# Patient Record
Sex: Male | Born: 1950
Health system: Southern US, Community
[De-identification: ages and names within clinical notes are randomized; demographics above are authoritative.]

## PROBLEM LIST (undated history)

## (undated) DIAGNOSIS — N400 Enlarged prostate without lower urinary tract symptoms: Secondary | ICD-10-CM

## (undated) DIAGNOSIS — N138 Other obstructive and reflux uropathy: Principal | ICD-10-CM

## (undated) DIAGNOSIS — I10 Essential (primary) hypertension: Secondary | ICD-10-CM

## (undated) DIAGNOSIS — E291 Testicular hypofunction: Secondary | ICD-10-CM

## (undated) DIAGNOSIS — T7840XA Allergy, unspecified, initial encounter: Secondary | ICD-10-CM

## (undated) DIAGNOSIS — Z87442 Personal history of urinary calculi: Secondary | ICD-10-CM

## (undated) DIAGNOSIS — E039 Hypothyroidism, unspecified: Secondary | ICD-10-CM

## (undated) DIAGNOSIS — N401 Enlarged prostate with lower urinary tract symptoms: Principal | ICD-10-CM

## (undated) DIAGNOSIS — E538 Deficiency of other specified B group vitamins: Secondary | ICD-10-CM

## (undated) DIAGNOSIS — R413 Other amnesia: Secondary | ICD-10-CM

## (undated) HISTORY — DX: Benign prostatic hyperplasia with lower urinary tract symptoms: N40.1

## (undated) HISTORY — DX: Hypothyroidism, unspecified: E03.9

## (undated) HISTORY — DX: Other amnesia: R41.3

## (undated) HISTORY — DX: Benign prostatic hyperplasia without lower urinary tract symptoms: N40.0

## (undated) HISTORY — DX: Essential (primary) hypertension: I10

## (undated) HISTORY — PX: SHOULDER SURGERY: SHX246

## (undated) HISTORY — DX: Allergy, unspecified, initial encounter: T78.40XA

## (undated) HISTORY — DX: Deficiency of other specified B group vitamins: E53.8

## (undated) HISTORY — PX: TONSILLECTOMY: SUR1361

## (undated) HISTORY — PX: EYE SURGERY: SHX253

## (undated) HISTORY — PX: KNEE SURGERY: SHX244

## (undated) HISTORY — DX: Testicular hypofunction: E29.1

## (undated) HISTORY — DX: Other obstructive and reflux uropathy: N13.8

---

## 2006-10-23 ENCOUNTER — Other Ambulatory Visit: Payer: Self-pay

## 2006-10-23 ENCOUNTER — Emergency Department: Payer: Self-pay | Admitting: Emergency Medicine

## 2011-04-08 ENCOUNTER — Ambulatory Visit: Payer: Self-pay | Admitting: Unknown Physician Specialty

## 2011-12-23 ENCOUNTER — Ambulatory Visit: Payer: Self-pay | Admitting: Orthopedic Surgery

## 2013-05-09 DIAGNOSIS — E291 Testicular hypofunction: Secondary | ICD-10-CM | POA: Diagnosis not present

## 2013-05-09 DIAGNOSIS — N410 Acute prostatitis: Secondary | ICD-10-CM | POA: Diagnosis not present

## 2013-05-09 DIAGNOSIS — I10 Essential (primary) hypertension: Secondary | ICD-10-CM | POA: Diagnosis not present

## 2013-05-09 DIAGNOSIS — Z125 Encounter for screening for malignant neoplasm of prostate: Secondary | ICD-10-CM | POA: Diagnosis not present

## 2013-05-09 DIAGNOSIS — E039 Hypothyroidism, unspecified: Secondary | ICD-10-CM | POA: Diagnosis not present

## 2013-05-09 DIAGNOSIS — K219 Gastro-esophageal reflux disease without esophagitis: Secondary | ICD-10-CM | POA: Diagnosis not present

## 2013-05-09 DIAGNOSIS — Z0000 Encounter for general adult medical examination without abnormal findings: Secondary | ICD-10-CM | POA: Diagnosis not present

## 2013-05-15 ENCOUNTER — Ambulatory Visit: Payer: Self-pay | Admitting: Family Medicine

## 2013-05-15 DIAGNOSIS — I10 Essential (primary) hypertension: Secondary | ICD-10-CM | POA: Diagnosis not present

## 2013-05-15 DIAGNOSIS — Z136 Encounter for screening for cardiovascular disorders: Secondary | ICD-10-CM | POA: Diagnosis not present

## 2013-06-11 DIAGNOSIS — R5383 Other fatigue: Secondary | ICD-10-CM | POA: Diagnosis not present

## 2013-06-11 DIAGNOSIS — R5381 Other malaise: Secondary | ICD-10-CM | POA: Diagnosis not present

## 2013-06-14 DIAGNOSIS — G4733 Obstructive sleep apnea (adult) (pediatric): Secondary | ICD-10-CM | POA: Diagnosis not present

## 2013-06-27 DIAGNOSIS — H35369 Drusen (degenerative) of macula, unspecified eye: Secondary | ICD-10-CM | POA: Diagnosis not present

## 2013-06-27 DIAGNOSIS — H43399 Other vitreous opacities, unspecified eye: Secondary | ICD-10-CM | POA: Diagnosis not present

## 2013-06-27 DIAGNOSIS — H43819 Vitreous degeneration, unspecified eye: Secondary | ICD-10-CM | POA: Diagnosis not present

## 2013-06-27 DIAGNOSIS — H35379 Puckering of macula, unspecified eye: Secondary | ICD-10-CM | POA: Diagnosis not present

## 2013-06-28 DIAGNOSIS — G4733 Obstructive sleep apnea (adult) (pediatric): Secondary | ICD-10-CM | POA: Diagnosis not present

## 2013-07-02 DIAGNOSIS — H35379 Puckering of macula, unspecified eye: Secondary | ICD-10-CM | POA: Diagnosis not present

## 2013-07-03 DIAGNOSIS — H35379 Puckering of macula, unspecified eye: Secondary | ICD-10-CM | POA: Diagnosis not present

## 2013-07-16 DIAGNOSIS — H35379 Puckering of macula, unspecified eye: Secondary | ICD-10-CM | POA: Diagnosis not present

## 2013-08-23 DIAGNOSIS — H43399 Other vitreous opacities, unspecified eye: Secondary | ICD-10-CM | POA: Diagnosis not present

## 2013-08-24 DIAGNOSIS — H43399 Other vitreous opacities, unspecified eye: Secondary | ICD-10-CM | POA: Diagnosis not present

## 2013-09-03 DIAGNOSIS — H35379 Puckering of macula, unspecified eye: Secondary | ICD-10-CM | POA: Diagnosis not present

## 2013-10-03 DIAGNOSIS — L821 Other seborrheic keratosis: Secondary | ICD-10-CM | POA: Diagnosis not present

## 2013-10-03 DIAGNOSIS — D485 Neoplasm of uncertain behavior of skin: Secondary | ICD-10-CM | POA: Diagnosis not present

## 2013-10-05 DIAGNOSIS — H35379 Puckering of macula, unspecified eye: Secondary | ICD-10-CM | POA: Diagnosis not present

## 2013-10-05 DIAGNOSIS — H02839 Dermatochalasis of unspecified eye, unspecified eyelid: Secondary | ICD-10-CM | POA: Diagnosis not present

## 2013-10-05 DIAGNOSIS — H18419 Arcus senilis, unspecified eye: Secondary | ICD-10-CM | POA: Diagnosis not present

## 2013-10-05 DIAGNOSIS — H26499 Other secondary cataract, unspecified eye: Secondary | ICD-10-CM | POA: Diagnosis not present

## 2013-10-12 DIAGNOSIS — E669 Obesity, unspecified: Secondary | ICD-10-CM | POA: Diagnosis not present

## 2013-10-12 DIAGNOSIS — G4733 Obstructive sleep apnea (adult) (pediatric): Secondary | ICD-10-CM | POA: Diagnosis not present

## 2013-10-29 DIAGNOSIS — I10 Essential (primary) hypertension: Secondary | ICD-10-CM | POA: Diagnosis not present

## 2013-10-29 DIAGNOSIS — Z23 Encounter for immunization: Secondary | ICD-10-CM | POA: Diagnosis not present

## 2014-02-15 DIAGNOSIS — G4733 Obstructive sleep apnea (adult) (pediatric): Secondary | ICD-10-CM | POA: Diagnosis not present

## 2014-02-15 DIAGNOSIS — E669 Obesity, unspecified: Secondary | ICD-10-CM | POA: Diagnosis not present

## 2014-04-02 HISTORY — PX: ESOPHAGOGASTRODUODENOSCOPY ENDOSCOPY: SHX5814

## 2014-04-02 HISTORY — PX: COLONOSCOPY: SHX5424

## 2014-04-17 ENCOUNTER — Ambulatory Visit: Payer: Self-pay | Admitting: Unknown Physician Specialty

## 2014-04-17 DIAGNOSIS — K210 Gastro-esophageal reflux disease with esophagitis: Secondary | ICD-10-CM | POA: Diagnosis not present

## 2014-04-17 DIAGNOSIS — K5730 Diverticulosis of large intestine without perforation or abscess without bleeding: Secondary | ICD-10-CM | POA: Diagnosis not present

## 2014-04-17 DIAGNOSIS — R12 Heartburn: Secondary | ICD-10-CM | POA: Diagnosis not present

## 2014-04-17 DIAGNOSIS — E079 Disorder of thyroid, unspecified: Secondary | ICD-10-CM | POA: Diagnosis not present

## 2014-04-17 DIAGNOSIS — K640 First degree hemorrhoids: Secondary | ICD-10-CM | POA: Diagnosis not present

## 2014-04-17 DIAGNOSIS — Z09 Encounter for follow-up examination after completed treatment for conditions other than malignant neoplasm: Secondary | ICD-10-CM | POA: Diagnosis not present

## 2014-04-17 DIAGNOSIS — G4730 Sleep apnea, unspecified: Secondary | ICD-10-CM | POA: Diagnosis not present

## 2014-04-17 DIAGNOSIS — Z86010 Personal history of colonic polyps: Secondary | ICD-10-CM | POA: Diagnosis not present

## 2014-04-17 DIAGNOSIS — K2960 Other gastritis without bleeding: Secondary | ICD-10-CM | POA: Diagnosis not present

## 2014-04-17 DIAGNOSIS — Z7982 Long term (current) use of aspirin: Secondary | ICD-10-CM | POA: Diagnosis not present

## 2014-04-17 DIAGNOSIS — K2950 Unspecified chronic gastritis without bleeding: Secondary | ICD-10-CM | POA: Diagnosis not present

## 2014-04-17 DIAGNOSIS — K5790 Diverticulosis of intestine, part unspecified, without perforation or abscess without bleeding: Secondary | ICD-10-CM | POA: Diagnosis not present

## 2014-04-17 DIAGNOSIS — I10 Essential (primary) hypertension: Secondary | ICD-10-CM | POA: Diagnosis not present

## 2014-04-17 DIAGNOSIS — K648 Other hemorrhoids: Secondary | ICD-10-CM | POA: Diagnosis not present

## 2014-05-14 DIAGNOSIS — N400 Enlarged prostate without lower urinary tract symptoms: Secondary | ICD-10-CM | POA: Diagnosis not present

## 2014-05-14 DIAGNOSIS — Z0000 Encounter for general adult medical examination without abnormal findings: Secondary | ICD-10-CM | POA: Diagnosis not present

## 2014-05-14 DIAGNOSIS — Z125 Encounter for screening for malignant neoplasm of prostate: Secondary | ICD-10-CM | POA: Diagnosis not present

## 2014-05-14 DIAGNOSIS — F0390 Unspecified dementia without behavioral disturbance: Secondary | ICD-10-CM | POA: Diagnosis not present

## 2014-05-14 DIAGNOSIS — I10 Essential (primary) hypertension: Secondary | ICD-10-CM | POA: Diagnosis not present

## 2014-05-14 DIAGNOSIS — E039 Hypothyroidism, unspecified: Secondary | ICD-10-CM | POA: Diagnosis not present

## 2014-05-27 LAB — SURGICAL PATHOLOGY

## 2014-06-13 DIAGNOSIS — E538 Deficiency of other specified B group vitamins: Secondary | ICD-10-CM | POA: Diagnosis not present

## 2014-06-13 DIAGNOSIS — R7309 Other abnormal glucose: Secondary | ICD-10-CM | POA: Diagnosis not present

## 2014-06-13 DIAGNOSIS — R413 Other amnesia: Secondary | ICD-10-CM

## 2014-06-13 HISTORY — DX: Other amnesia: R41.3

## 2014-06-13 HISTORY — DX: Deficiency of other specified B group vitamins: E53.8

## 2014-08-15 ENCOUNTER — Other Ambulatory Visit: Payer: Self-pay | Admitting: Neurology

## 2014-08-15 DIAGNOSIS — R413 Other amnesia: Secondary | ICD-10-CM | POA: Diagnosis not present

## 2014-08-15 DIAGNOSIS — E538 Deficiency of other specified B group vitamins: Secondary | ICD-10-CM | POA: Diagnosis not present

## 2014-08-15 DIAGNOSIS — M25512 Pain in left shoulder: Secondary | ICD-10-CM | POA: Diagnosis not present

## 2014-08-15 DIAGNOSIS — G8929 Other chronic pain: Secondary | ICD-10-CM | POA: Diagnosis not present

## 2014-08-16 DIAGNOSIS — G4733 Obstructive sleep apnea (adult) (pediatric): Secondary | ICD-10-CM | POA: Diagnosis not present

## 2014-08-16 DIAGNOSIS — E669 Obesity, unspecified: Secondary | ICD-10-CM | POA: Diagnosis not present

## 2014-08-22 ENCOUNTER — Ambulatory Visit
Admission: RE | Admit: 2014-08-22 | Discharge: 2014-08-22 | Disposition: A | Discharge status: Home / Self Care | Payer: Medicare Other | Source: Ambulatory Visit | Attending: Neurology | Admitting: Neurology

## 2014-08-22 DIAGNOSIS — I679 Cerebrovascular disease, unspecified: Secondary | ICD-10-CM | POA: Diagnosis not present

## 2014-08-22 DIAGNOSIS — R413 Other amnesia: Secondary | ICD-10-CM | POA: Diagnosis present

## 2014-09-05 DIAGNOSIS — M25512 Pain in left shoulder: Secondary | ICD-10-CM | POA: Diagnosis not present

## 2014-09-05 DIAGNOSIS — M19012 Primary osteoarthritis, left shoulder: Secondary | ICD-10-CM | POA: Diagnosis not present

## 2014-10-30 ENCOUNTER — Encounter: Payer: Self-pay | Admitting: Family Medicine

## 2014-10-30 ENCOUNTER — Ambulatory Visit (INDEPENDENT_AMBULATORY_CARE_PROVIDER_SITE_OTHER): Payer: Medicare Other | Admitting: Family Medicine

## 2014-10-30 VITALS — BP 109/68 | HR 76 | Temp 97.7°F | Ht 66.25 in | Wt 218.0 lb

## 2014-10-30 DIAGNOSIS — E039 Hypothyroidism, unspecified: Secondary | ICD-10-CM

## 2014-10-30 DIAGNOSIS — I10 Essential (primary) hypertension: Secondary | ICD-10-CM | POA: Diagnosis not present

## 2014-10-30 DIAGNOSIS — Z23 Encounter for immunization: Secondary | ICD-10-CM

## 2014-10-30 HISTORY — DX: Essential (primary) hypertension: I10

## 2014-10-30 HISTORY — DX: Hypothyroidism, unspecified: E03.9

## 2014-10-30 MED ORDER — LOSARTAN POTASSIUM-HCTZ 100-25 MG PO TABS: 1.0000 | ORAL_TABLET | Freq: Every day | ORAL | Status: DC

## 2014-10-30 NOTE — Progress Notes (Signed)
BP 109/68 mmHg  Pulse 76  Temp(Src) 97.7 F (36.5 C)  Ht 5' 6.25" (1.683 m)  Wt 218 lb (98.884 kg)  BMI 34.91 kg/m2  SpO2 98%   Subjective:    Patient ID: Joshua [name], male    DOB: 11-Dec-1950, 64 y.o.   MRN: [id number]  HPI: Joshua [name] is a 64 y.o. male  Chief Complaint  Patient presents with  . Follow-up  . Flu Vaccine   patient doing much better last visit there is concerns about memory. Patient's memory has improved as his wife who is becoming an invalid is now getting much stronger and better. Patient with decreasing stress has improved significantly. Memory concerns have resolved  Patient using CPAP every night sleeping much better and feeling better when he wakes On using CPAP may have had a big impact on memory  Blood pressure doing well on Hyzaar without issues no side effects takes medicines faithfully  Thyroid doing well no concerns takes medicines faithfully.  Relevant past medical, surgical, family and social history reviewed and updated as indicated. Interim medical history since our last visit reviewed. Allergies and medications reviewed and updated.  Review of Systems  Constitutional: Negative.   Respiratory: Negative.   Cardiovascular: Negative.     Per HPI unless specifically indicated above     Objective:    BP 109/68 mmHg  Pulse 76  Temp(Src) 97.7 F (36.5 C)  Ht 5' 6.25" (1.683 m)  Wt 218 lb (98.884 kg)  BMI 34.91 kg/m2  SpO2 98%  Wt Readings from Last 3 Encounters:  10/30/14 218 lb (98.884 kg)  08/22/14 220 lb (99.791 kg)    Physical Exam  Constitutional: He is oriented to person, place, and time. He appears well-developed and well-nourished. No distress.  HENT:  Head: Normocephalic and atraumatic.  Right Ear: Hearing normal.  Left Ear: Hearing normal.  Nose: Nose normal.  Eyes: Conjunctivae and lids are normal. Right eye exhibits no discharge. Left eye exhibits no discharge. No scleral icterus.  Cardiovascular: Normal  rate, regular rhythm and normal heart sounds.   Pulmonary/Chest: Effort normal and breath sounds normal. No respiratory distress.  Musculoskeletal: Normal range of motion.  Neurological: He is alert and oriented to person, place, and time.  Skin: Skin is intact. No rash noted.  Psychiatric: He has a normal mood and affect. His speech is normal and behavior is normal. Judgment and thought content normal. Cognition and memory are normal.    Results for orders placed or performed in visit on 04/17/14  Surgical pathology  Result Value Ref Range   SURGICAL PATHOLOGY      Surgical Procedure CASE: [id number] PATIENT: [name] Surgical Pathology Report     SPECIMEN SUBMITTED: A. Antrum, cbx B. GE junction, cbx  CLINICAL HISTORY: None provided  PRE-OPERATIVE DIAGNOSIS: Heartburn, PH polyps  POST-OPERATIVE DIAGNOSIS: Gastritis, esophagitis, diverticulosis     DIAGNOSIS: A. ANTRUM; COLD BIOPSY: - OXYNTIC TYPE MUCOSA WITH MINIMAL CHRONIC GASTRITIS. - NEGATIVE FOR H PYLORI, DYSPLASIA AND MALIGNANCY.  B. GE JUNCTION; COLD BIOPSY: - SQUAMOCOLUMNAR MUCOSA WITH REFLUX GASTROESOPHAGITIS. - NEGATIVE FOR INTESTINAL METAPLASIA, DYSPLASIA AND MALIGNANCY.  GROSS DESCRIPTION:  A. Labeled: C biopsy antrum Tissue Fragment(s): 1 Measurement: 0.3 cm Comment: Pink Entirely submitted in cassette(s): 1  B. Labeled: C biopsy gej Tissue Fragment(s): 1 Measurement: 0.3 cm Comment: Pink Entirely submitted in cassette(s): 1     Final Diagnosis performed by Delorse Lek, MD.  Electronically signed 04/19/2014 2:10:56PM    Th e electronic  signature indicates that the named Attending Pathologist has evaluated the specimen  Technical component performed at [facility], [address], [place], [place] [address] Lab: [phone] Dir: Darrick Penna. Evette Doffing, MD  Professional component performed at [facility], [facility], [place], [place],  [address] Lab:  [phone] Dir: Dellia Nims. Rubinas, MD        Assessment & Plan:   Problem List Items Addressed This Visit      Cardiovascular and Mediastinum   Benign essential HTN    Blood pressure doing well The current medical regimen is effective;  continue present plan and medications.       Relevant Medications   aspirin EC 81 MG tablet   losartan-hydrochlorothiazide (HYZAAR) 100-25 MG tablet     Endocrine   Hypothyroidism    The current medical regimen is effective;  continue present plan and medications.       Relevant Medications   levothyroxine (SYNTHROID, LEVOTHROID) 137 MCG tablet    Other Visit Diagnoses    Need for influenza vaccination    -  Primary    Relevant Orders    Flu Vaccine QUAD 36+ mos IM (Fluarix & Fluzone Quad PF (Completed)        Follow up plan: Return in about 6 months (around 04/29/2015), or if symptoms worsen or fail to improve, for Physical Exam.

## 2014-10-30 NOTE — Assessment & Plan Note (Signed)
Blood pressure doing well The current medical regimen is effective;  continue present plan and medications.

## 2014-10-30 NOTE — Assessment & Plan Note (Signed)
The current medical regimen is effective;  continue present plan and medications.  

## 2014-10-30 NOTE — Addendum Note (Signed)
Addended byGolden Pop on: 10/30/2014 02:03 PM   Modules accepted: Orders

## 2014-10-31 ENCOUNTER — Encounter: Payer: Self-pay | Admitting: Family Medicine

## 2014-10-31 LAB — BASIC METABOLIC PANEL
BUN / CREAT RATIO: 17 (ref 10–22)
BUN: 17 mg/dL (ref 8–27)
CO2: 27 mmol/L (ref 18–29)
CREATININE: 1 mg/dL (ref 0.76–1.27)
Calcium: 9.6 mg/dL (ref 8.6–10.2)
Chloride: 98 mmol/L (ref 97–108)
GFR, EST AFRICAN AMERICAN: 90 mL/min/{1.73_m2} (ref >59)
GFR, EST NON AFRICAN AMERICAN: 78 mL/min/{1.73_m2} (ref >59)
Glucose: 82 mg/dL (ref 65–99)
Potassium: 4.3 mmol/L (ref 3.5–5.2)
Sodium: 140 mmol/L (ref 134–144)

## 2015-03-20 DIAGNOSIS — M1712 Unilateral primary osteoarthritis, left knee: Secondary | ICD-10-CM | POA: Diagnosis not present

## 2015-03-20 DIAGNOSIS — M12812 Other specific arthropathies, not elsewhere classified, left shoulder: Secondary | ICD-10-CM | POA: Diagnosis not present

## 2015-03-31 ENCOUNTER — Encounter: Payer: Self-pay | Admitting: Family Medicine

## 2015-03-31 ENCOUNTER — Ambulatory Visit (INDEPENDENT_AMBULATORY_CARE_PROVIDER_SITE_OTHER): Payer: Medicare Other | Admitting: Family Medicine

## 2015-03-31 VITALS — BP 127/81 | HR 69 | Temp 97.9°F | Ht 65.3 in | Wt 214.0 lb

## 2015-03-31 DIAGNOSIS — E039 Hypothyroidism, unspecified: Secondary | ICD-10-CM | POA: Diagnosis not present

## 2015-03-31 DIAGNOSIS — I10 Essential (primary) hypertension: Secondary | ICD-10-CM

## 2015-03-31 NOTE — Progress Notes (Signed)
BP 127/81 mmHg  Pulse 69  Temp(Src) 97.9 F (36.6 C)  Ht 5' 5.3" (1.659 m)  Wt 214 lb (97.07 kg)  BMI 35.27 kg/m2  SpO2 99%   Subjective:    Patient ID: Joshua [name], male    DOB: 05-03-1950, 65 y.o.   MRN: [id number]  HPI: Joshua [name] is a 65 y.o. male  Chief Complaint  Patient presents with  . Hypothyroidism  . Fatigue  . URI    seasonal sinus issues   patient taking medications faithfully but had several days this weekend and last week where he just was really tired and draggy feeling a little better today. No illness like flu cough cold fever or chills. Patient using CPAP faithfully does have some dry mouth issues reviewed patient has face mask but may be open his mouth will try with a chinstrap Reviewed labs from last visit were normal patient not do full labs until physical in April   Relevant past medical, surgical, family and social history reviewed and updated as indicated. Interim medical history since our last visit reviewed. Allergies and medications reviewed and updated.  Review of Systems  Constitutional: Negative.   HENT: Negative.   Eyes: Negative.   Respiratory: Negative.   Cardiovascular: Negative.   Gastrointestinal: Negative.   Endocrine: Negative.   Genitourinary: Negative.   Musculoskeletal: Negative.   Skin: Negative.   Allergic/Immunologic: Negative.   Neurological: Negative.   Hematological: Negative.   Psychiatric/Behavioral: Negative.     Per HPI unless specifically indicated above     Objective:    BP 127/81 mmHg  Pulse 69  Temp(Src) 97.9 F (36.6 C)  Ht 5' 5.3" (1.659 m)  Wt 214 lb (97.07 kg)  BMI 35.27 kg/m2  SpO2 99%  Wt Readings from Last 3 Encounters:  03/31/15 214 lb (97.07 kg)  05/14/14 219 lb (99.338 kg)  10/30/14 218 lb (98.884 kg)    Physical Exam  Constitutional: He is oriented to person, place, and time. He appears well-developed and well-nourished. No distress.  HENT:  Head: Normocephalic and  atraumatic.  Right Ear: Hearing and external ear normal.  Left Ear: Hearing and external ear normal.  Nose: Nose normal.  Mouth/Throat: Oropharynx is clear and moist. No oropharyngeal exudate.  Eyes: Conjunctivae and lids are normal. Right eye exhibits no discharge. Left eye exhibits no discharge. No scleral icterus.  Neck: No tracheal deviation present. No thyromegaly present.  Cardiovascular: Normal rate, regular rhythm and normal heart sounds.   Pulmonary/Chest: Effort normal and breath sounds normal. No respiratory distress. He has no wheezes. He has no rales.  Musculoskeletal: Normal range of motion.  Lymphadenopathy:    He has no cervical adenopathy.  Neurological: He is alert and oriented to person, place, and time.  Skin: Skin is intact. No rash noted.  Psychiatric: He has a normal mood and affect. His speech is normal and behavior is normal. Judgment and thought content normal. Cognition and memory are normal.    Results for orders placed or performed in visit on 123XX123  Basic metabolic panel  Result Value Ref Range   Glucose 82 65 - 99 mg/dL   BUN 17 8 - 27 mg/dL   Creatinine, Ser 1.00 0.76 - 1.27 mg/dL   GFR calc non Af Amer 78 >59 mL/min/1.73   GFR calc Af Amer 90 >59 mL/min/1.73   BUN/Creatinine Ratio 17 10 - 22   Sodium 140 134 - 144 mmol/L   Potassium 4.3 3.5 - 5.2 mmol/L  Chloride 98 97 - 108 mmol/L   CO2 27 18 - 29 mmol/L   Calcium 9.6 8.6 - 10.2 mg/dL      Assessment & Plan:   Problem List Items Addressed This Visit      Cardiovascular and Mediastinum   Benign essential HTN - Primary    Will continue current blood pressure medications and check BMP to make sure no electrolyte issues causing fatigue      Relevant Orders   Basic metabolic panel     Endocrine   Hypothyroidism    Will check TSH to make sure adequate replacement therapy is being done      Relevant Orders   TSH       Follow up plan: Return for Physical Exam in April and not need  to do TSH.

## 2015-03-31 NOTE — Assessment & Plan Note (Signed)
Will check TSH to make sure adequate replacement therapy is being done

## 2015-03-31 NOTE — Assessment & Plan Note (Signed)
Will continue current blood pressure medications and check BMP to make sure no electrolyte issues causing fatigue

## 2015-04-01 LAB — BASIC METABOLIC PANEL
BUN / CREAT RATIO: 17 (ref 10–22)
BUN: 23 mg/dL (ref 8–27)
CO2: 25 mmol/L (ref 18–29)
CREATININE: 1.32 mg/dL — AB (ref 0.76–1.27)
Calcium: 9.4 mg/dL (ref 8.6–10.2)
Chloride: 98 mmol/L (ref 96–106)
GFR calc Af Amer: 65 mL/min/{1.73_m2} (ref >59)
GFR, EST NON AFRICAN AMERICAN: 56 mL/min/{1.73_m2} — AB (ref >59)
Glucose: 86 mg/dL (ref 65–99)
Potassium: 4.1 mmol/L (ref 3.5–5.2)
SODIUM: 138 mmol/L (ref 134–144)

## 2015-04-01 LAB — TSH: TSH: 8.71 u[IU]/mL — ABNORMAL HIGH (ref 0.450–4.500)

## 2015-04-02 ENCOUNTER — Telehealth: Payer: Self-pay | Admitting: Family Medicine

## 2015-04-02 DIAGNOSIS — E039 Hypothyroidism, unspecified: Secondary | ICD-10-CM

## 2015-04-02 NOTE — Telephone Encounter (Signed)
Phone call Discussed with patient elevated TSH patient taking thyroid medications every day without fail Patient will continue medications in a couple months will recheck TSH

## 2015-04-02 NOTE — Telephone Encounter (Signed)
-----   Message from Wynn Maudlin, [place] sent at 04/02/2015  4:57 PM EST ----- labs

## 2015-05-07 ENCOUNTER — Other Ambulatory Visit: Payer: Self-pay | Admitting: Family Medicine

## 2015-05-08 NOTE — Telephone Encounter (Signed)
He should have enough to make it to his recheck on 5/1- can you make sure he does? Thanks.

## 2015-05-09 NOTE — Telephone Encounter (Signed)
Patient notified, he does have enough to get through to his appointment.

## 2015-05-15 ENCOUNTER — Encounter: Payer: Self-pay | Admitting: Family Medicine

## 2015-05-15 ENCOUNTER — Ambulatory Visit (INDEPENDENT_AMBULATORY_CARE_PROVIDER_SITE_OTHER): Payer: Medicare Other | Admitting: Family Medicine

## 2015-05-15 VITALS — BP 118/79 | HR 96 | Temp 98.2°F | Ht 65.1 in | Wt 211.0 lb

## 2015-05-15 DIAGNOSIS — E039 Hypothyroidism, unspecified: Secondary | ICD-10-CM | POA: Diagnosis not present

## 2015-05-15 DIAGNOSIS — I10 Essential (primary) hypertension: Secondary | ICD-10-CM | POA: Diagnosis not present

## 2015-05-15 DIAGNOSIS — N138 Other obstructive and reflux uropathy: Secondary | ICD-10-CM

## 2015-05-15 DIAGNOSIS — Z0000 Encounter for general adult medical examination without abnormal findings: Secondary | ICD-10-CM

## 2015-05-15 DIAGNOSIS — J019 Acute sinusitis, unspecified: Secondary | ICD-10-CM | POA: Diagnosis not present

## 2015-05-15 DIAGNOSIS — N401 Enlarged prostate with lower urinary tract symptoms: Secondary | ICD-10-CM

## 2015-05-15 HISTORY — DX: Benign prostatic hyperplasia with lower urinary tract symptoms: N13.8

## 2015-05-15 HISTORY — DX: Benign prostatic hyperplasia with lower urinary tract symptoms: N40.1

## 2015-05-15 LAB — URINALYSIS, ROUTINE W REFLEX MICROSCOPIC
Bilirubin, UA: NEGATIVE
GLUCOSE, UA: NEGATIVE
Ketones, UA: NEGATIVE
LEUKOCYTES UA: NEGATIVE
Nitrite, UA: NEGATIVE
Protein, UA: NEGATIVE
RBC, UA: NEGATIVE
Specific Gravity, UA: 1.015 (ref 1.005–1.030)
UUROB: 0.2 mg/dL (ref 0.2–1.0)
pH, UA: 5.5 (ref 5.0–7.5)

## 2015-05-15 MED ORDER — TAMSULOSIN HCL 0.4 MG PO CAPS: 0.4000 mg | ORAL_CAPSULE | Freq: Every day | ORAL | Status: DC

## 2015-05-15 MED ORDER — LOSARTAN POTASSIUM-HCTZ 100-25 MG PO TABS: 1.0000 | ORAL_TABLET | Freq: Every day | ORAL | Status: DC

## 2015-05-15 MED ORDER — AMOXICILLIN 875 MG PO TABS: 875.0000 mg | ORAL_TABLET | Freq: Two times a day (BID) | ORAL | Status: DC

## 2015-05-15 NOTE — Assessment & Plan Note (Signed)
Discuss Will add Flomax

## 2015-05-15 NOTE — Progress Notes (Signed)
BP 118/79 mmHg  Pulse 96  Temp(Src) 98.2 F (36.8 C)  Ht 5' 5.1" (1.654 m)  Wt 211 lb (95.709 kg)  BMI 34.98 kg/m2  SpO2 96%   Subjective:    Patient ID: Joshua [name], male    DOB: 1950-10-13, 65 y.o.   MRN: [id number]  HPI: Joshua [name] is a 65 y.o. male  Chief Complaint  Patient presents with  . Annual Exam   Patient follow-up for physical has been having problems with fatigue pretty much all year concerned that last TSH was elevated concerned may still have some thyroid issues which will be rechecked today. Patient taking his thyroid medications faithfully with his other medicines class of water and wait 30 minutes until he eats.'s been doing this consistently for for years. Taking B12 pills Patient also for about a weeks been having head cold congestion drainage sore throat coughing but symptoms mostly throat no real fever but is really felt bad with marked systemic symptoms. Doing well with blood pressure medications no concerns or complaints Patient also with having to use the bathroom a lot concerned hydrochlorothiazide may be aggravating doesn't drink a lot of fluids but has to go to the bathroom a lot and it takes a long time.   Relevant past medical, surgical, family and social history reviewed and updated as indicated. Interim medical history since our last visit reviewed. Allergies and medications reviewed and updated.  Other than above  Review of Systems  Constitutional: Negative.   HENT: Negative.   Eyes: Negative.   Respiratory: Negative.   Cardiovascular: Negative.   Gastrointestinal: Negative.   Endocrine: Negative.   Genitourinary: Negative.   Musculoskeletal:       Patient's left knee bothers having had a shot in it but still hurts and is bad. Hasn't been back to orthopedics yet  Skin: Negative.   Allergic/Immunologic: Negative.   Neurological: Negative.   Hematological: Negative.   Psychiatric/Behavioral: Negative.     Per HPI unless  specifically indicated above     Objective:    BP 118/79 mmHg  Pulse 96  Temp(Src) 98.2 F (36.8 C)  Ht 5' 5.1" (1.654 m)  Wt 211 lb (95.709 kg)  BMI 34.98 kg/m2  SpO2 96%  Wt Readings from Last 3 Encounters:  05/15/15 211 lb (95.709 kg)  03/31/15 214 lb (97.07 kg)  05/14/14 219 lb (99.338 kg)    Physical Exam  Constitutional: He is oriented to person, place, and time. He appears well-developed and well-nourished.  HENT:  Head: Normocephalic and atraumatic.  Right Ear: External ear normal.  Left Ear: External ear normal.  Eyes: Conjunctivae and EOM are normal. Pupils are equal, round, and reactive to light.  Neck: Normal range of motion. Neck supple.  Cardiovascular: Normal rate, regular rhythm, normal heart sounds and intact distal pulses.   Pulmonary/Chest: Effort normal and breath sounds normal.  Abdominal: Soft. Bowel sounds are normal. There is no splenomegaly or hepatomegaly.  Genitourinary: Rectum normal and penis normal.  BPH  Musculoskeletal: Normal range of motion.  Neurological: He is alert and oriented to person, place, and time. He has normal reflexes.  Skin: No rash noted. No erythema.  Psychiatric: He has a normal mood and affect. His behavior is normal. Judgment and thought content normal.    Results for orders placed or performed in visit on 03/31/15  TSH  Result Value Ref Range   TSH 8.710 (H) 0.450 - 4.500 uIU/mL  Basic metabolic panel  Result Value Ref  Range   Glucose 86 65 - 99 mg/dL   BUN 23 8 - 27 mg/dL   Creatinine, Ser 1.32 (H) 0.76 - 1.27 mg/dL   GFR calc non Af Amer 56 (L) >59 mL/min/1.73   GFR calc Af Amer 65 >59 mL/min/1.73   BUN/Creatinine Ratio 17 10 - 22   Sodium 138 134 - 144 mmol/L   Potassium 4.1 3.5 - 5.2 mmol/L   Chloride 98 96 - 106 mmol/L   CO2 25 18 - 29 mmol/L   Calcium 9.4 8.6 - 10.2 mg/dL      Assessment & Plan:   Problem List Items Addressed This Visit      Cardiovascular and Mediastinum   Benign essential HTN -  Primary    The current medical regimen is effective;  continue present plan and medications.       Relevant Medications   losartan-hydrochlorothiazide (HYZAAR) 100-25 MG tablet     Endocrine   Hypothyroidism    The current medical regimen is effective;  continue present plan and medications.         Genitourinary   BPH with obstruction/lower urinary tract symptoms    Discuss Will add Flomax      Relevant Medications   tamsulosin (FLOMAX) 0.4 MG CAPS capsule    Other Visit Diagnoses    Acute sinusitis, recurrence not specified, unspecified location        Scuffs sinusitis care and treatment use of Amoxil use of Tussionex and this patient has which helps Tylenol cold and sinus etc. Mucinex D and so on.    Relevant Medications    amoxicillin (AMOXIL) 875 MG tablet    PE (physical exam), annual            Follow up plan: Return in about 6 months (around 11/14/2015) for BMP.

## 2015-05-15 NOTE — Assessment & Plan Note (Signed)
The current medical regimen is effective;  continue present plan and medications.  

## 2015-05-16 LAB — COMPREHENSIVE METABOLIC PANEL
ALK PHOS: 60 IU/L (ref 39–117)
ALT: 11 IU/L (ref 0–44)
AST: 16 IU/L (ref 0–40)
Albumin/Globulin Ratio: 1.7 (ref 1.2–2.2)
Albumin: 4.3 g/dL (ref 3.6–4.8)
BILIRUBIN TOTAL: 0.6 mg/dL (ref 0.0–1.2)
BUN / CREAT RATIO: 18 (ref 10–24)
BUN: 22 mg/dL (ref 8–27)
CHLORIDE: 97 mmol/L (ref 96–106)
CO2: 24 mmol/L (ref 18–29)
CREATININE: 1.23 mg/dL (ref 0.76–1.27)
Calcium: 9.4 mg/dL (ref 8.6–10.2)
GFR calc Af Amer: 70 mL/min/{1.73_m2} (ref >59)
GFR calc non Af Amer: 60 mL/min/{1.73_m2} (ref >59)
GLOBULIN, TOTAL: 2.6 g/dL (ref 1.5–4.5)
GLUCOSE: 96 mg/dL (ref 65–99)
Potassium: 3.9 mmol/L (ref 3.5–5.2)
SODIUM: 137 mmol/L (ref 134–144)
Total Protein: 6.9 g/dL (ref 6.0–8.5)

## 2015-05-16 LAB — CBC WITH DIFFERENTIAL/PLATELET
BASOS: 0 %
Basophils Absolute: 0 10*3/uL (ref 0.0–0.2)
EOS (ABSOLUTE): 0.3 10*3/uL (ref 0.0–0.4)
EOS: 4 %
HEMATOCRIT: 37.6 % (ref 37.5–51.0)
HEMOGLOBIN: 12.7 g/dL (ref 12.6–17.7)
IMMATURE GRANS (ABS): 0 10*3/uL (ref 0.0–0.1)
IMMATURE GRANULOCYTES: 0 %
LYMPHS: 26 %
Lymphocytes Absolute: 2.2 10*3/uL (ref 0.7–3.1)
MCH: 32.6 pg (ref 26.6–33.0)
MCHC: 33.8 g/dL (ref 31.5–35.7)
MCV: 97 fL (ref 79–97)
Monocytes Absolute: 0.8 10*3/uL (ref 0.1–0.9)
Monocytes: 10 %
NEUTROS ABS: 5.1 10*3/uL (ref 1.4–7.0)
NEUTROS PCT: 60 %
Platelets: 196 10*3/uL (ref 150–379)
RBC: 3.89 x10E6/uL — ABNORMAL LOW (ref 4.14–5.80)
RDW: 14.2 % (ref 12.3–15.4)
WBC: 8.4 10*3/uL (ref 3.4–10.8)

## 2015-05-16 LAB — PSA: Prostate Specific Ag, Serum: 3.1 ng/mL (ref 0.0–4.0)

## 2015-05-16 LAB — LIPID PANEL
CHOLESTEROL TOTAL: 192 mg/dL (ref 100–199)
Chol/HDL Ratio: 4.8 ratio units (ref 0.0–5.0)
HDL: 40 mg/dL (ref >39)
LDL CALC: 99 mg/dL (ref 0–99)
TRIGLYCERIDES: 267 mg/dL — AB (ref 0–149)
VLDL Cholesterol Cal: 53 mg/dL — ABNORMAL HIGH (ref 5–40)

## 2015-05-16 LAB — TSH: TSH: 1.18 u[IU]/mL (ref 0.450–4.500)

## 2015-05-19 ENCOUNTER — Encounter: Payer: Self-pay | Admitting: Family Medicine

## 2015-05-22 ENCOUNTER — Telehealth: Payer: Self-pay | Admitting: Family Medicine

## 2015-05-22 MED ORDER — LEVOTHYROXINE SODIUM 137 MCG PO TABS: 137.0000 ug | ORAL_TABLET | Freq: Every day | ORAL | Status: DC

## 2015-05-22 NOTE — Telephone Encounter (Signed)
Pt called has questions about his thyroid. Stated the letter did not mention anything about his thyroid results. Please call pt back ASAP. Thanks.

## 2015-05-22 NOTE — Telephone Encounter (Signed)
Call pt 

## 2015-05-22 NOTE — Telephone Encounter (Signed)
TSH was normal, patient would like 90 day Rx sent to [facility] for his Levothyroxine

## 2015-05-22 NOTE — Telephone Encounter (Signed)
Called patient, no answer. Left msg to please call back

## 2015-05-22 NOTE — Telephone Encounter (Signed)
Pt returned call

## 2015-05-26 ENCOUNTER — Telehealth: Payer: Self-pay | Admitting: Family Medicine

## 2015-05-26 DIAGNOSIS — N138 Other obstructive and reflux uropathy: Secondary | ICD-10-CM

## 2015-05-26 DIAGNOSIS — N401 Enlarged prostate with lower urinary tract symptoms: Principal | ICD-10-CM

## 2015-05-26 NOTE — Telephone Encounter (Signed)
Phone call Discussed with patient has developed hives while taking Flomax stopped that has taken a couple Benadryl hives are little better is breathing okay. Will refer patient to urology to further evaluate BPH with lower urinary tract symptoms and best way to treat.

## 2015-05-26 NOTE — Telephone Encounter (Signed)
Pt called stated his Flomax is causing him to break out in hives. Pt would like a call back from Dr. Jeananne Rama. Pt stated he has taken 2 Benadryl. Please call pt ASAP. Thanks.

## 2015-06-10 ENCOUNTER — Ambulatory Visit (INDEPENDENT_AMBULATORY_CARE_PROVIDER_SITE_OTHER): Payer: Medicare Other | Admitting: Urology

## 2015-06-10 ENCOUNTER — Encounter: Payer: Self-pay | Admitting: Urology

## 2015-06-10 VITALS — BP 99/66 | HR 81 | Ht 66.5 in | Wt 213.7 lb

## 2015-06-10 DIAGNOSIS — N5201 Erectile dysfunction due to arterial insufficiency: Secondary | ICD-10-CM | POA: Diagnosis not present

## 2015-06-10 DIAGNOSIS — N138 Other obstructive and reflux uropathy: Secondary | ICD-10-CM

## 2015-06-10 DIAGNOSIS — N529 Male erectile dysfunction, unspecified: Secondary | ICD-10-CM | POA: Insufficient documentation

## 2015-06-10 DIAGNOSIS — N200 Calculus of kidney: Secondary | ICD-10-CM

## 2015-06-10 DIAGNOSIS — Z125 Encounter for screening for malignant neoplasm of prostate: Secondary | ICD-10-CM | POA: Diagnosis not present

## 2015-06-10 DIAGNOSIS — N401 Enlarged prostate with lower urinary tract symptoms: Secondary | ICD-10-CM | POA: Diagnosis not present

## 2015-06-10 LAB — URINALYSIS, COMPLETE
BILIRUBIN UA: NEGATIVE
GLUCOSE, UA: NEGATIVE
KETONES UA: NEGATIVE
LEUKOCYTES UA: NEGATIVE
NITRITE UA: NEGATIVE
Protein, UA: NEGATIVE
RBC UA: NEGATIVE
SPEC GRAV UA: 1.01 (ref 1.005–1.030)
Urobilinogen, Ur: 0.2 mg/dL (ref 0.2–1.0)
pH, UA: 6 (ref 5.0–7.5)

## 2015-06-10 LAB — BLADDER SCAN AMB NON-IMAGING: Scan Result: 84

## 2015-06-10 LAB — MICROSCOPIC EXAMINATION
Bacteria, UA: NONE SEEN
EPITHELIAL CELLS (NON RENAL): NONE SEEN /HPF (ref 0–10)
RBC MICROSCOPIC, UA: NONE SEEN /HPF (ref 0–?)
WBC UA: NONE SEEN /HPF (ref 0–?)

## 2015-06-10 MED ORDER — SILODOSIN 8 MG PO CAPS: 8.0000 mg | ORAL_CAPSULE | Freq: Every day | ORAL | Status: DC

## 2015-06-10 NOTE — Progress Notes (Signed)
06/10/2015 2:21 PM   Joshua [name] 08/03/1950 [id number]  Referring provider: Guadalupe Maple, MD [address] [place], [place] [address]  Chief Complaint  Patient presents with  . New Patient (Initial Visit)    BPH    HPI:  1 - Lower Urinary Tract Symptoms - Pt with slowly progressive bother from mostly obstructive symptoms with weak stream, straining, hesitancy, and prolonged post-void dribble, No irritative symptoms. PVR 2017 "95mL" (not severely elevated). DRE 2017 35gm (not significantly enlarged) but cysto does confirm intraluminal hypertrophy.  He gives history of what sound like stricture dilation years ago but cysto 06/2015 confirms no lower tract significant sricture disesae.  Given trial of tamsulosin by PCP but developed hives. Giving trial of Rapaflo 06/2015.   2 - Prostate Screening - No FHX prostate cancer 06/2015 PSA 3.1 / DRE 35gm smoth  3 - Erectile Dysfunction - pt with moderate bother from lack of firmness during intercourse. Unaided is able to penetrate 100% and maintian to orgasm 100% of time, just "softer" than when younger. No nitrates. After consideration of pharmacotherapy he desires observation.  PMH sig for jaw surgery, obesity. No CV disease / blood thinners. 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  Today "raidon" is seen as new patient for above. They are referred by [name] MD.    PMH: Past Medical History  Diagnosis Date  . Allergy   . BPH (benign prostatic hyperplasia)   . Hypogonadism in male   . Benign essential HTN 10/30/2014  . B12 deficiency 06/13/2014  . Hypothyroidism 10/30/2014  . BPH with obstruction/lower urinary tract symptoms 05/15/2015  . Amnesia 06/13/2014    Surgical History: Past Surgical History  Procedure Laterality Date  . Knee surgery Right   . Shoulder surgery Left   . Tonsillectomy    . Eye surgery Bilateral     cataracts  . Colonoscopy  March 2016  . Esophagogastroduodenoscopy endoscopy  March  2016    Home Medications:    Medication List       This list is accurate as of: 06/10/15  2:21 PM.  Always use your most recent med list.               amoxicillin 875 MG tablet  Commonly known as:  AMOXIL  Take 1 tablet (875 mg total) by mouth 2 (two) times daily.     aspirin EC 81 MG tablet  Take 81 mg by mouth daily.     levothyroxine 137 MCG tablet  Commonly known as:  SYNTHROID, LEVOTHROID  Take 1 tablet (137 mcg total) by mouth daily before breakfast.     losartan-hydrochlorothiazide 100-25 MG tablet  Commonly known as:  HYZAAR  Take 1 tablet by mouth daily.     RA VITAMIN B-12 TR 1000 MCG Tbcr  Generic drug:  Cyanocobalamin  Take 1,000 mcg by mouth daily.        Allergies:  Allergies  Allergen Reactions  . Aspirin Hives  . Celery Oil Swelling  . Flomax [Tamsulosin Hcl] Hives  . Hydromorphone Hives  . Latex Rash    Family History: Family History  Problem Relation Age of Onset  . Cancer Mother     breast  . COPD Father   . Heart disease Father     Social History:  reports that he has never smoked. He has never used smokeless tobacco. He reports that he drinks alcohol. He reports that he does not use illicit drugs.  ROS: UROLOGY Frequent Urination?: Yes Hard to postpone urination?: Yes Burning/pain with urination?: No Get up at night to urinate?: Yes Leakage of urine?: Yes Urine stream starts and stops?: No Trouble starting stream?: No Do you have to strain to urinate?: No Blood in urine?: No Urinary tract infection?: No Sexually transmitted disease?: No Injury to kidneys or bladder?: No Painful intercourse?: No Weak stream?: Yes Erection problems?: Yes Penile pain?: No  Gastrointestinal Nausea?: No Vomiting?: No Indigestion/heartburn?: No Diarrhea?: Yes Constipation?: No  Constitutional Fever: No Night sweats?: No Weight loss?: Yes Fatigue?: Yes  Skin Skin rash/lesions?: Yes Itching?: Yes  Eyes Blurred vision?:  No Double vision?: No  Ears/Nose/Throat Sore throat?: No Sinus problems?: Yes  Hematologic/Lymphatic Swollen glands?: No Easy bruising?: No  Cardiovascular Leg swelling?: No Chest pain?: No  Respiratory Cough?: No Shortness of breath?: No  Endocrine Excessive thirst?: Yes  Musculoskeletal Back pain?: No Joint pain?: No  Neurological Headaches?: No Dizziness?: No  Psychologic Depression?: No Anxiety?: No  Physical Exam: Ht 5' 6.5" (1.689 m)  Wt 213 lb 11.2 oz (96.934 kg)  BMI 33.98 kg/m2  Constitutional:  Alert and oriented, No acute distress. HEENT: [place] AT, moist mucus membranes.  Trachea midline, no masses. Cardiovascular: No clubbing, cyanosis, or edema. Respiratory: Normal respiratory effort, no increased work of breathing. GI: Abdomen is soft, nontender, nondistended, no abdominal masses GU: No CVA tenderness. DRE 35gm smooth. No testes masses. No hernias.  Skin: No rashes, bruises or suspicious lesions. Lymph: No cervical or inguinal adenopathy. Neurologic: Grossly intact, no focal deficits, moving all 4 extremities. Psychiatric: Normal mood and affect.  Laboratory Data: Lab Results  Component Value Date   WBC 8.4 05/15/2015   HCT 37.6 05/15/2015   MCV 97 05/15/2015   PLT 196 05/15/2015    Lab Results  Component Value Date   CREATININE 1.23 05/15/2015    No results found for: PSA  No results found for: TESTOSTERONE  No results found for: HGBA1C  Urinalysis    Component Value Date/Time   APPEARANCEUR Clear 05/15/2015 1502   GLUCOSEU Negative 05/15/2015 1502   BILIRUBINUR Negative 05/15/2015 1502   PROTEINUR Negative 05/15/2015 1502   NITRITE Negative 05/15/2015 1502   LEUKOCYTESUR Negative 05/15/2015 1502      Cystoscopy Procedure Note  Patient identification was confirmed, informed consent was obtained, and patient was prepped using Betadine solution.  Lidocaine jelly was administered per urethral meatus.    Preoperative abx  where received prior to procedure.     Pre-Procedure: - Inspection reveals a normal caliber ureteral meatus.  Procedure: The flexible cystoscope was introduced without difficulty - No urethral strictures/lesions are present. - Enlarged prostate intraluminally with "kissing lobe" protrusion. - Normal bladder neck - Bilateral ureteral orifices identified - Bladder mucosa  reveals no ulcers, tumors, or lesions - No bladder stones - No trabeculation  Retroflexion shows no additional findings.    Post-Procedure: - Patient tolerated the procedure well     Assessment & Plan:    1 - Lower Urinary Tract Symptoms - Likely from intraluminal BPH changes, no sricture recurrence by cysto. As prostate is not very large palpably, alpha blocker most likely to achive symptom response. Suggest trial of Rapaflo. Frankly discussed there is some risk of crossreactivity with tamsulosin. If this does not meet his goals or he has hives again, would strongly consider TURP / PVP / or even urolift.   2 - Prostate Screening - up to date this year, rec continue screening until age 71-75 as  average risk.    3 - Erectile Dysfunction -discussed natural history of age-related vasculogenic decline and that he is actually doing much better than majority of men at baseline. He was very reassured to hear this. Discussed role of pharmacotherapy and he declines, may reconsider if progresses.   4 - RTC 67mos to verify response to therapy above, then anually v prn.   No Follow-up on file.  Alexis Frock, [facility] [address] [place], [place] [address] [phone]

## 2015-06-18 ENCOUNTER — Telehealth: Payer: Self-pay

## 2015-07-14 ENCOUNTER — Telehealth: Payer: Self-pay | Admitting: Urology

## 2015-07-14 ENCOUNTER — Other Ambulatory Visit: Payer: Self-pay

## 2015-07-14 DIAGNOSIS — N401 Enlarged prostate with lower urinary tract symptoms: Principal | ICD-10-CM

## 2015-07-14 DIAGNOSIS — N138 Other obstructive and reflux uropathy: Secondary | ICD-10-CM

## 2015-07-14 DIAGNOSIS — N400 Enlarged prostate without lower urinary tract symptoms: Secondary | ICD-10-CM

## 2015-07-14 MED ORDER — SILODOSIN 8 MG PO CAPS: 8.0000 mg | ORAL_CAPSULE | Freq: Every day | ORAL | Status: DC

## 2015-07-14 NOTE — Telephone Encounter (Signed)
Pt came by the office stating prescription was never sent to [facility] on [address].  It was sent in May 2017.  Kelita printed out a copy of where it was sent and we gave it to pt.  Now he says Vladimir Faster should have called our office to see if there's something else we could prescribe that was cheaper.  Vladimir Faster was supposed to contact our office, but there are no notes in pt chart.  Can you please check with Dr. Tresa Moore to see if there is anything cheaper than Rapaflo to prescribe to pt?

## 2015-07-15 NOTE — Telephone Encounter (Signed)
Please advise 

## 2015-07-15 NOTE — Telephone Encounter (Signed)
Dr. Zettie Pho note stated that he experienced hives while on the tamsulosin.  If his insurance will not cover Rapaflo, it most likely will not cover Cialis.  We can call this medication in and see what his insurance does.

## 2015-07-16 MED ORDER — TADALAFIL 5 MG PO TABS
5.0000 mg | ORAL_TABLET | Freq: Every day | ORAL | Status: DC | PRN
Start: 2015-07-16 — End: 2015-07-16

## 2015-07-16 MED ORDER — TADALAFIL 5 MG PO TABS: 5.0000 mg | ORAL_TABLET | Freq: Every day | ORAL | Status: DC | PRN

## 2015-07-16 NOTE — Telephone Encounter (Signed)
cialis sent into pharmacy.

## 2015-07-21 DIAGNOSIS — G8929 Other chronic pain: Secondary | ICD-10-CM | POA: Diagnosis not present

## 2015-07-21 DIAGNOSIS — M1712 Unilateral primary osteoarthritis, left knee: Secondary | ICD-10-CM | POA: Diagnosis not present

## 2015-07-21 DIAGNOSIS — M25562 Pain in left knee: Secondary | ICD-10-CM | POA: Diagnosis not present

## 2015-07-22 ENCOUNTER — Telehealth: Payer: Self-pay | Admitting: Urology

## 2015-07-22 NOTE — Telephone Encounter (Signed)
Per Dr. Louis Meckel pt should start Alfuzosin 10mg  daily. Gave verbal order to pharmacist. Pt will call back in a month to make aware if medication works or not.

## 2015-07-22 NOTE — Telephone Encounter (Signed)
Mr. Wallace stopped by the office on 6.20.17 saying his insurance carrier wouldn't pay for Cialis to be filled and was wondering if a Rx of Rapaflo could be sent to his pharmacy instead. I told him it was already sent and confirmed with a representative from [facility] that they had the Rx. The young lady said the Rx would be ready by 4:30pm today. Yasmine from [facility] called at 4:34pm saying the Rx needs a prior authorization.  Yasmine's ph# [phone] Thank you.

## 2015-07-24 ENCOUNTER — Telehealth: Payer: Self-pay | Admitting: Urology

## 2015-07-24 NOTE — Telephone Encounter (Signed)
There are multiple medications listed for his BPH.  Would you contact the patient and ask what medication he is taking, alfuzosin, tamsulosin, Cialis or Rapaflo?

## 2015-07-24 NOTE — Telephone Encounter (Signed)
Okay.  There is a request for refill for Cialis in my co-sign list.

## 2015-07-24 NOTE — Telephone Encounter (Signed)
Spoke with pt in reference to BPH medication. Pt stated that he is currently taking Alfuzosin. Pt stated he had a reaction to tamsulosin and insurance would not pay for cialis or rapaflo.

## 2015-09-16 ENCOUNTER — Encounter: Payer: Self-pay | Admitting: Urology

## 2015-09-16 ENCOUNTER — Ambulatory Visit (INDEPENDENT_AMBULATORY_CARE_PROVIDER_SITE_OTHER): Payer: Medicare Other | Admitting: Urology

## 2015-09-16 VITALS — BP 127/76 | HR 63 | Ht 66.0 in | Wt 212.0 lb

## 2015-09-16 DIAGNOSIS — N401 Enlarged prostate with lower urinary tract symptoms: Secondary | ICD-10-CM

## 2015-09-16 DIAGNOSIS — N200 Calculus of kidney: Secondary | ICD-10-CM

## 2015-09-16 DIAGNOSIS — N5201 Erectile dysfunction due to arterial insufficiency: Secondary | ICD-10-CM | POA: Diagnosis not present

## 2015-09-16 DIAGNOSIS — N138 Other obstructive and reflux uropathy: Secondary | ICD-10-CM

## 2015-09-16 NOTE — Progress Notes (Signed)
09/16/2015 8:10 AM   Joshua [name] Apr 24, 1950 [id number]  Referring provider: Guadalupe Maple, MD [address] [place], [place] [address]  No chief complaint on file.   HPI:  1 - Lower Urinary Tract Symptoms - Pt with slowly progressive bother from mostly obstructive symptoms with weak stream, straining, hesitancy, and prolonged post-void dribble, No irritative symptoms. PVR 2017 "42mL" (not severely elevated). DRE 2017 35gm (not significantly enlarged) but cysto does confirm intraluminal hypertrophy.  He gives history of what sound like stricture dilation years ago but cysto 06/2015 confirms no lower tract significant sricture disesae.  Given trial of tamsulosin by PCP but developed hives. Insurance denied rapaflo and daily cialis. Now on alfuzosin 10 QD which he states makes symptoms WORSE and therefore stopped. At this point his level of bother is not so much he wants therapy.   2 - Prostate Screening - No FHX prostate cancer 06/2015 PSA 3.1 / DRE 35gm smoth  3 - Erectile Dysfunction - pt with moderate bother from lack of firmness during intercourse. Unaided is able to penetrate 100% and maintian to orgasm 100% of time, just "softer" than when younger. No nitrates. After consideration of pharmacotherapy he desires observation.  PMH sig for jaw surgery, obesity. No CV disease / blood thinners. 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  Today "romain" is seen in f/u above. He has now tried several alpha blockers and not been significantly improved. He thinks his baseline is better.    PMH: Past Medical History:  Diagnosis Date  . Allergy   . Amnesia 06/13/2014  . B12 deficiency 06/13/2014  . Benign essential HTN 10/30/2014  . BPH (benign prostatic hyperplasia)   . BPH with obstruction/lower urinary tract symptoms 05/15/2015  . Hypogonadism in male   . Hypothyroidism 10/30/2014    Surgical History: Past Surgical History:  Procedure Laterality Date  . COLONOSCOPY   March 2016  . ESOPHAGOGASTRODUODENOSCOPY ENDOSCOPY  March 2016  . EYE SURGERY Bilateral    cataracts  . KNEE SURGERY Right   . SHOULDER SURGERY Left   . TONSILLECTOMY      Home Medications:    Medication List       Accurate as of 09/16/15  8:10 AM. Always use your most recent med list.          amoxicillin 875 MG tablet Commonly known as:  AMOXIL Take 1 tablet (875 mg total) by mouth 2 (two) times daily.   aspirin EC 81 MG tablet Take 81 mg by mouth daily.   levothyroxine 137 MCG tablet Commonly known as:  SYNTHROID, LEVOTHROID Take 1 tablet (137 mcg total) by mouth daily before breakfast.   losartan-hydrochlorothiazide 100-25 MG tablet Commonly known as:  HYZAAR Take 1 tablet by mouth daily.   RA VITAMIN B-12 TR 1000 MCG Tbcr Generic drug:  Cyanocobalamin Take 1,000 mcg by mouth daily.   silodosin 8 MG Caps capsule Commonly known as:  RAPAFLO Take 1 capsule (8 mg total) by mouth daily with breakfast.   tadalafil 5 MG tablet Commonly known as:  CIALIS Take 1 tablet (5 mg total) by mouth daily as needed for erectile dysfunction.       Allergies:  Allergies  Allergen Reactions  . Aspirin Hives  . Celery Oil Swelling  . Flomax [Tamsulosin Hcl] Hives  . Hydromorphone Hives  . Latex Rash    Family History: Family History  Problem Relation Age of Onset  . Cancer Mother  breast  . COPD Father   . Heart disease Father   . Prostate cancer Father     Social History:  reports that he has never smoked. He has never used smokeless tobacco. He reports that he drinks alcohol. He reports that he does not use drugs.   Review of Systems  Gastrointestinal (upper)  : Negative for upper GI symptoms  Gastrointestinal (lower) : Negative for lower GI symptoms  Constitutional : Negative for symptoms  Skin: Negative for skin symptoms  Eyes: Negative for eye symptoms  Ear/Nose/Throat : Negative for Ear/Nose/Throat  symptoms  Hematologic/Lymphatic: Negative for Hematologic/Lymphatic symptoms  Cardiovascular : Negative for cardiovascular symptoms  Respiratory : Negative for respiratory symptoms  Endocrine: Negative for endocrine symptoms  Musculoskeletal: Joint pain  Neurological: Negative for neurological symptoms  Psychologic: Negative for psychiatric symptoms     Physical Exam: There were no vitals taken for this visit.  Constitutional:  Alert and oriented, No acute distress. HEENT: [place] AT, moist mucus membranes.  Trachea midline, no masses. Cardiovascular: No clubbing, cyanosis, or edema. Respiratory: Normal respiratory effort, no increased work of breathing. GI: Abdomen is soft, nontender, nondistended, no abdominal masses GU: No CVA tenderness. DRE 35gm smooth. No testes masses. No hernias.  Skin: No rashes, bruises or suspicious lesions. Lymph: No cervical or inguinal adenopathy. Neurologic: Grossly intact, no focal deficits, moving all 4 extremities. Psychiatric: Normal mood and affect.  Laboratory Data: Lab Results  Component Value Date   WBC 8.4 05/15/2015   HCT 37.6 05/15/2015   MCV 97 05/15/2015   PLT 196 05/15/2015    Lab Results  Component Value Date   CREATININE 1.23 05/15/2015    No results found for: PSA  No results found for: TESTOSTERONE  No results found for: HGBA1C  Urinalysis    Component Value Date/Time   APPEARANCEUR Clear 06/10/2015 1431   GLUCOSEU Negative 06/10/2015 1431   BILIRUBINUR Negative 06/10/2015 1431   PROTEINUR Negative 06/10/2015 1431   NITRITE Negative 06/10/2015 1431   LEUKOCYTESUR Negative 06/10/2015 1431       Assessment & Plan:    1 - Lower Urinary Tract Symptoms - Likely from intraluminal BPH changes, no sricture recurrence by cysto this year. He did not derive benefit from alpha blockers. Discussed observation v. Trial of finasteride v. Surgical therapy. He opts for observation, consider finasteride should bother  increase. I agree completely as he empties well.   2 - Prostate Screening - up to date this year, rec continue screening until age 29-75 as average risk.    3 - Erectile Dysfunction -discussed natural history of age-related vasculogenic decline and that he is actually doing much better than majority of men at baseline. He was very reassured to hear this. Discussed role of pharmacotherapy and he declines, may reconsider if progresses.   4 - RTC prn  No Follow-up on file.  Alexis Frock, [facility] [address], [place],  [address] [phone]

## 2015-09-25 DIAGNOSIS — G4733 Obstructive sleep apnea (adult) (pediatric): Secondary | ICD-10-CM | POA: Diagnosis not present

## 2015-09-25 DIAGNOSIS — E669 Obesity, unspecified: Secondary | ICD-10-CM | POA: Diagnosis not present

## 2015-10-17 DIAGNOSIS — H6063 Unspecified chronic otitis externa, bilateral: Secondary | ICD-10-CM | POA: Diagnosis not present

## 2015-10-17 DIAGNOSIS — G4733 Obstructive sleep apnea (adult) (pediatric): Secondary | ICD-10-CM | POA: Diagnosis not present

## 2015-10-17 DIAGNOSIS — J301 Allergic rhinitis due to pollen: Secondary | ICD-10-CM | POA: Diagnosis not present

## 2015-10-17 DIAGNOSIS — J0100 Acute maxillary sinusitis, unspecified: Secondary | ICD-10-CM | POA: Diagnosis not present

## 2015-10-17 DIAGNOSIS — H903 Sensorineural hearing loss, bilateral: Secondary | ICD-10-CM | POA: Diagnosis not present

## 2015-11-06 ENCOUNTER — Ambulatory Visit (INDEPENDENT_AMBULATORY_CARE_PROVIDER_SITE_OTHER): Payer: Medicare Other

## 2015-11-06 DIAGNOSIS — Z23 Encounter for immunization: Secondary | ICD-10-CM

## 2015-11-12 DIAGNOSIS — M9903 Segmental and somatic dysfunction of lumbar region: Secondary | ICD-10-CM | POA: Diagnosis not present

## 2015-11-12 DIAGNOSIS — M9905 Segmental and somatic dysfunction of pelvic region: Secondary | ICD-10-CM | POA: Diagnosis not present

## 2015-11-12 DIAGNOSIS — M5136 Other intervertebral disc degeneration, lumbar region: Secondary | ICD-10-CM | POA: Diagnosis not present

## 2015-11-12 DIAGNOSIS — M5432 Sciatica, left side: Secondary | ICD-10-CM | POA: Diagnosis not present

## 2015-11-13 DIAGNOSIS — M5432 Sciatica, left side: Secondary | ICD-10-CM | POA: Diagnosis not present

## 2015-11-13 DIAGNOSIS — M9903 Segmental and somatic dysfunction of lumbar region: Secondary | ICD-10-CM | POA: Diagnosis not present

## 2015-11-13 DIAGNOSIS — M9905 Segmental and somatic dysfunction of pelvic region: Secondary | ICD-10-CM | POA: Diagnosis not present

## 2015-11-13 DIAGNOSIS — M5136 Other intervertebral disc degeneration, lumbar region: Secondary | ICD-10-CM | POA: Diagnosis not present

## 2015-11-14 DIAGNOSIS — M9903 Segmental and somatic dysfunction of lumbar region: Secondary | ICD-10-CM | POA: Diagnosis not present

## 2015-11-14 DIAGNOSIS — M5136 Other intervertebral disc degeneration, lumbar region: Secondary | ICD-10-CM | POA: Diagnosis not present

## 2015-11-14 DIAGNOSIS — M5432 Sciatica, left side: Secondary | ICD-10-CM | POA: Diagnosis not present

## 2015-11-14 DIAGNOSIS — M9905 Segmental and somatic dysfunction of pelvic region: Secondary | ICD-10-CM | POA: Diagnosis not present

## 2015-11-18 DIAGNOSIS — M9903 Segmental and somatic dysfunction of lumbar region: Secondary | ICD-10-CM | POA: Diagnosis not present

## 2015-11-18 DIAGNOSIS — M9905 Segmental and somatic dysfunction of pelvic region: Secondary | ICD-10-CM | POA: Diagnosis not present

## 2015-11-18 DIAGNOSIS — M5432 Sciatica, left side: Secondary | ICD-10-CM | POA: Diagnosis not present

## 2015-11-18 DIAGNOSIS — M5136 Other intervertebral disc degeneration, lumbar region: Secondary | ICD-10-CM | POA: Diagnosis not present

## 2015-11-19 DIAGNOSIS — M5432 Sciatica, left side: Secondary | ICD-10-CM | POA: Diagnosis not present

## 2015-11-19 DIAGNOSIS — M9903 Segmental and somatic dysfunction of lumbar region: Secondary | ICD-10-CM | POA: Diagnosis not present

## 2015-11-19 DIAGNOSIS — M9905 Segmental and somatic dysfunction of pelvic region: Secondary | ICD-10-CM | POA: Diagnosis not present

## 2015-11-19 DIAGNOSIS — M5136 Other intervertebral disc degeneration, lumbar region: Secondary | ICD-10-CM | POA: Diagnosis not present

## 2015-11-21 DIAGNOSIS — M9903 Segmental and somatic dysfunction of lumbar region: Secondary | ICD-10-CM | POA: Diagnosis not present

## 2015-11-21 DIAGNOSIS — M5136 Other intervertebral disc degeneration, lumbar region: Secondary | ICD-10-CM | POA: Diagnosis not present

## 2015-11-21 DIAGNOSIS — M5432 Sciatica, left side: Secondary | ICD-10-CM | POA: Diagnosis not present

## 2015-11-21 DIAGNOSIS — M9905 Segmental and somatic dysfunction of pelvic region: Secondary | ICD-10-CM | POA: Diagnosis not present

## 2015-11-24 ENCOUNTER — Telehealth: Payer: Self-pay | Admitting: Family Medicine

## 2015-11-24 MED ORDER — MECLIZINE HCL 32 MG PO TABS: 32.0000 mg | ORAL_TABLET | Freq: Three times a day (TID) | ORAL | 1 refills | Status: DC | PRN

## 2015-11-24 NOTE — Telephone Encounter (Signed)
Routing to provider for advice.

## 2015-11-24 NOTE — Telephone Encounter (Signed)
Phone call Discussed with patient marked vertigo will give some meclizine patient had follow-up if not getting better.

## 2015-11-24 NOTE — Telephone Encounter (Signed)
call

## 2015-11-24 NOTE — Telephone Encounter (Signed)
Patient states that at 4am yesterday morning he experienced dizziness and vomiting. He states that again this morning he experienced dizziness and vomiting again but that this time it has lasted all morning. Pt would like to know if there is something, you can call in form his symptoms or if you feel that, he should come in to be seen. Please call to advise.

## 2015-11-25 ENCOUNTER — Telehealth: Payer: Self-pay | Admitting: Family Medicine

## 2015-11-25 DIAGNOSIS — M9903 Segmental and somatic dysfunction of lumbar region: Secondary | ICD-10-CM | POA: Diagnosis not present

## 2015-11-25 DIAGNOSIS — M9905 Segmental and somatic dysfunction of pelvic region: Secondary | ICD-10-CM | POA: Diagnosis not present

## 2015-11-25 DIAGNOSIS — M5136 Other intervertebral disc degeneration, lumbar region: Secondary | ICD-10-CM | POA: Diagnosis not present

## 2015-11-25 DIAGNOSIS — M5432 Sciatica, left side: Secondary | ICD-10-CM | POA: Diagnosis not present

## 2015-11-25 MED ORDER — MECLIZINE HCL 25 MG PO TABS
25.0000 mg | ORAL_TABLET | Freq: Three times a day (TID) | ORAL | 2 refills | Status: DC | PRN
Start: 2015-11-25 — End: 2016-12-13

## 2015-11-25 NOTE — Telephone Encounter (Signed)
Change sent.

## 2015-11-25 NOTE — Telephone Encounter (Signed)
Routing to provider. Pharm wants to know if it can be changed to the 25mg  or 12.5mg .

## 2015-11-26 DIAGNOSIS — M9903 Segmental and somatic dysfunction of lumbar region: Secondary | ICD-10-CM | POA: Diagnosis not present

## 2015-11-26 DIAGNOSIS — M9905 Segmental and somatic dysfunction of pelvic region: Secondary | ICD-10-CM | POA: Diagnosis not present

## 2015-11-26 DIAGNOSIS — M5136 Other intervertebral disc degeneration, lumbar region: Secondary | ICD-10-CM | POA: Diagnosis not present

## 2015-11-26 DIAGNOSIS — M5432 Sciatica, left side: Secondary | ICD-10-CM | POA: Diagnosis not present

## 2015-11-28 DIAGNOSIS — M9903 Segmental and somatic dysfunction of lumbar region: Secondary | ICD-10-CM | POA: Diagnosis not present

## 2015-11-28 DIAGNOSIS — M5432 Sciatica, left side: Secondary | ICD-10-CM | POA: Diagnosis not present

## 2015-11-28 DIAGNOSIS — M9905 Segmental and somatic dysfunction of pelvic region: Secondary | ICD-10-CM | POA: Diagnosis not present

## 2015-11-28 DIAGNOSIS — M5136 Other intervertebral disc degeneration, lumbar region: Secondary | ICD-10-CM | POA: Diagnosis not present

## 2015-12-01 DIAGNOSIS — M5432 Sciatica, left side: Secondary | ICD-10-CM | POA: Diagnosis not present

## 2015-12-01 DIAGNOSIS — M9903 Segmental and somatic dysfunction of lumbar region: Secondary | ICD-10-CM | POA: Diagnosis not present

## 2015-12-01 DIAGNOSIS — M5136 Other intervertebral disc degeneration, lumbar region: Secondary | ICD-10-CM | POA: Diagnosis not present

## 2015-12-01 DIAGNOSIS — M9905 Segmental and somatic dysfunction of pelvic region: Secondary | ICD-10-CM | POA: Diagnosis not present

## 2015-12-02 DIAGNOSIS — M5136 Other intervertebral disc degeneration, lumbar region: Secondary | ICD-10-CM | POA: Diagnosis not present

## 2015-12-02 DIAGNOSIS — M9905 Segmental and somatic dysfunction of pelvic region: Secondary | ICD-10-CM | POA: Diagnosis not present

## 2015-12-02 DIAGNOSIS — M5432 Sciatica, left side: Secondary | ICD-10-CM | POA: Diagnosis not present

## 2015-12-02 DIAGNOSIS — M9903 Segmental and somatic dysfunction of lumbar region: Secondary | ICD-10-CM | POA: Diagnosis not present

## 2015-12-05 DIAGNOSIS — M5432 Sciatica, left side: Secondary | ICD-10-CM | POA: Diagnosis not present

## 2015-12-05 DIAGNOSIS — M9905 Segmental and somatic dysfunction of pelvic region: Secondary | ICD-10-CM | POA: Diagnosis not present

## 2015-12-05 DIAGNOSIS — M9903 Segmental and somatic dysfunction of lumbar region: Secondary | ICD-10-CM | POA: Diagnosis not present

## 2015-12-05 DIAGNOSIS — M5136 Other intervertebral disc degeneration, lumbar region: Secondary | ICD-10-CM | POA: Diagnosis not present

## 2015-12-08 DIAGNOSIS — M5136 Other intervertebral disc degeneration, lumbar region: Secondary | ICD-10-CM | POA: Diagnosis not present

## 2015-12-08 DIAGNOSIS — M9905 Segmental and somatic dysfunction of pelvic region: Secondary | ICD-10-CM | POA: Diagnosis not present

## 2015-12-08 DIAGNOSIS — M9903 Segmental and somatic dysfunction of lumbar region: Secondary | ICD-10-CM | POA: Diagnosis not present

## 2015-12-08 DIAGNOSIS — M5432 Sciatica, left side: Secondary | ICD-10-CM | POA: Diagnosis not present

## 2015-12-10 DIAGNOSIS — M9903 Segmental and somatic dysfunction of lumbar region: Secondary | ICD-10-CM | POA: Diagnosis not present

## 2015-12-10 DIAGNOSIS — M9905 Segmental and somatic dysfunction of pelvic region: Secondary | ICD-10-CM | POA: Diagnosis not present

## 2015-12-10 DIAGNOSIS — M5136 Other intervertebral disc degeneration, lumbar region: Secondary | ICD-10-CM | POA: Diagnosis not present

## 2015-12-10 DIAGNOSIS — M5432 Sciatica, left side: Secondary | ICD-10-CM | POA: Diagnosis not present

## 2015-12-15 DIAGNOSIS — M9905 Segmental and somatic dysfunction of pelvic region: Secondary | ICD-10-CM | POA: Diagnosis not present

## 2015-12-15 DIAGNOSIS — M5432 Sciatica, left side: Secondary | ICD-10-CM | POA: Diagnosis not present

## 2015-12-15 DIAGNOSIS — M9903 Segmental and somatic dysfunction of lumbar region: Secondary | ICD-10-CM | POA: Diagnosis not present

## 2015-12-15 DIAGNOSIS — M5136 Other intervertebral disc degeneration, lumbar region: Secondary | ICD-10-CM | POA: Diagnosis not present

## 2015-12-17 DIAGNOSIS — M9905 Segmental and somatic dysfunction of pelvic region: Secondary | ICD-10-CM | POA: Diagnosis not present

## 2015-12-17 DIAGNOSIS — M5136 Other intervertebral disc degeneration, lumbar region: Secondary | ICD-10-CM | POA: Diagnosis not present

## 2015-12-17 DIAGNOSIS — M9903 Segmental and somatic dysfunction of lumbar region: Secondary | ICD-10-CM | POA: Diagnosis not present

## 2015-12-17 DIAGNOSIS — M5432 Sciatica, left side: Secondary | ICD-10-CM | POA: Diagnosis not present

## 2015-12-22 DIAGNOSIS — M9903 Segmental and somatic dysfunction of lumbar region: Secondary | ICD-10-CM | POA: Diagnosis not present

## 2015-12-22 DIAGNOSIS — M9905 Segmental and somatic dysfunction of pelvic region: Secondary | ICD-10-CM | POA: Diagnosis not present

## 2015-12-22 DIAGNOSIS — M5136 Other intervertebral disc degeneration, lumbar region: Secondary | ICD-10-CM | POA: Diagnosis not present

## 2015-12-22 DIAGNOSIS — M5432 Sciatica, left side: Secondary | ICD-10-CM | POA: Diagnosis not present

## 2015-12-24 DIAGNOSIS — M9905 Segmental and somatic dysfunction of pelvic region: Secondary | ICD-10-CM | POA: Diagnosis not present

## 2015-12-24 DIAGNOSIS — M5432 Sciatica, left side: Secondary | ICD-10-CM | POA: Diagnosis not present

## 2015-12-24 DIAGNOSIS — M9903 Segmental and somatic dysfunction of lumbar region: Secondary | ICD-10-CM | POA: Diagnosis not present

## 2015-12-24 DIAGNOSIS — M5136 Other intervertebral disc degeneration, lumbar region: Secondary | ICD-10-CM | POA: Diagnosis not present

## 2015-12-29 DIAGNOSIS — M9903 Segmental and somatic dysfunction of lumbar region: Secondary | ICD-10-CM | POA: Diagnosis not present

## 2015-12-29 DIAGNOSIS — M5432 Sciatica, left side: Secondary | ICD-10-CM | POA: Diagnosis not present

## 2015-12-29 DIAGNOSIS — M5136 Other intervertebral disc degeneration, lumbar region: Secondary | ICD-10-CM | POA: Diagnosis not present

## 2015-12-29 DIAGNOSIS — M9905 Segmental and somatic dysfunction of pelvic region: Secondary | ICD-10-CM | POA: Diagnosis not present

## 2015-12-31 DIAGNOSIS — M5432 Sciatica, left side: Secondary | ICD-10-CM | POA: Diagnosis not present

## 2015-12-31 DIAGNOSIS — M9903 Segmental and somatic dysfunction of lumbar region: Secondary | ICD-10-CM | POA: Diagnosis not present

## 2015-12-31 DIAGNOSIS — M9905 Segmental and somatic dysfunction of pelvic region: Secondary | ICD-10-CM | POA: Diagnosis not present

## 2015-12-31 DIAGNOSIS — M5136 Other intervertebral disc degeneration, lumbar region: Secondary | ICD-10-CM | POA: Diagnosis not present

## 2016-01-05 DIAGNOSIS — M5136 Other intervertebral disc degeneration, lumbar region: Secondary | ICD-10-CM | POA: Diagnosis not present

## 2016-01-05 DIAGNOSIS — M5432 Sciatica, left side: Secondary | ICD-10-CM | POA: Diagnosis not present

## 2016-01-05 DIAGNOSIS — M9905 Segmental and somatic dysfunction of pelvic region: Secondary | ICD-10-CM | POA: Diagnosis not present

## 2016-01-05 DIAGNOSIS — M9903 Segmental and somatic dysfunction of lumbar region: Secondary | ICD-10-CM | POA: Diagnosis not present

## 2016-01-21 DIAGNOSIS — M5432 Sciatica, left side: Secondary | ICD-10-CM | POA: Diagnosis not present

## 2016-01-21 DIAGNOSIS — M5136 Other intervertebral disc degeneration, lumbar region: Secondary | ICD-10-CM | POA: Diagnosis not present

## 2016-01-21 DIAGNOSIS — M9903 Segmental and somatic dysfunction of lumbar region: Secondary | ICD-10-CM | POA: Diagnosis not present

## 2016-01-21 DIAGNOSIS — M9905 Segmental and somatic dysfunction of pelvic region: Secondary | ICD-10-CM | POA: Diagnosis not present

## 2016-01-22 DIAGNOSIS — M5432 Sciatica, left side: Secondary | ICD-10-CM | POA: Diagnosis not present

## 2016-01-22 DIAGNOSIS — M5136 Other intervertebral disc degeneration, lumbar region: Secondary | ICD-10-CM | POA: Diagnosis not present

## 2016-01-22 DIAGNOSIS — M9903 Segmental and somatic dysfunction of lumbar region: Secondary | ICD-10-CM | POA: Diagnosis not present

## 2016-01-22 DIAGNOSIS — M9905 Segmental and somatic dysfunction of pelvic region: Secondary | ICD-10-CM | POA: Diagnosis not present

## 2016-01-27 DIAGNOSIS — M9905 Segmental and somatic dysfunction of pelvic region: Secondary | ICD-10-CM | POA: Diagnosis not present

## 2016-01-27 DIAGNOSIS — M9903 Segmental and somatic dysfunction of lumbar region: Secondary | ICD-10-CM | POA: Diagnosis not present

## 2016-01-27 DIAGNOSIS — M5432 Sciatica, left side: Secondary | ICD-10-CM | POA: Diagnosis not present

## 2016-01-27 DIAGNOSIS — M5136 Other intervertebral disc degeneration, lumbar region: Secondary | ICD-10-CM | POA: Diagnosis not present

## 2016-01-28 DIAGNOSIS — M5432 Sciatica, left side: Secondary | ICD-10-CM | POA: Diagnosis not present

## 2016-01-28 DIAGNOSIS — M9905 Segmental and somatic dysfunction of pelvic region: Secondary | ICD-10-CM | POA: Diagnosis not present

## 2016-01-28 DIAGNOSIS — M5136 Other intervertebral disc degeneration, lumbar region: Secondary | ICD-10-CM | POA: Diagnosis not present

## 2016-01-28 DIAGNOSIS — M9903 Segmental and somatic dysfunction of lumbar region: Secondary | ICD-10-CM | POA: Diagnosis not present

## 2016-01-30 DIAGNOSIS — M9905 Segmental and somatic dysfunction of pelvic region: Secondary | ICD-10-CM | POA: Diagnosis not present

## 2016-01-30 DIAGNOSIS — M5432 Sciatica, left side: Secondary | ICD-10-CM | POA: Diagnosis not present

## 2016-01-30 DIAGNOSIS — M5136 Other intervertebral disc degeneration, lumbar region: Secondary | ICD-10-CM | POA: Diagnosis not present

## 2016-01-30 DIAGNOSIS — M9903 Segmental and somatic dysfunction of lumbar region: Secondary | ICD-10-CM | POA: Diagnosis not present

## 2016-02-03 DIAGNOSIS — M5136 Other intervertebral disc degeneration, lumbar region: Secondary | ICD-10-CM | POA: Diagnosis not present

## 2016-02-03 DIAGNOSIS — M5432 Sciatica, left side: Secondary | ICD-10-CM | POA: Diagnosis not present

## 2016-02-03 DIAGNOSIS — M9903 Segmental and somatic dysfunction of lumbar region: Secondary | ICD-10-CM | POA: Diagnosis not present

## 2016-02-03 DIAGNOSIS — M9905 Segmental and somatic dysfunction of pelvic region: Secondary | ICD-10-CM | POA: Diagnosis not present

## 2016-02-04 DIAGNOSIS — M9903 Segmental and somatic dysfunction of lumbar region: Secondary | ICD-10-CM | POA: Diagnosis not present

## 2016-02-04 DIAGNOSIS — M5432 Sciatica, left side: Secondary | ICD-10-CM | POA: Diagnosis not present

## 2016-02-04 DIAGNOSIS — M9905 Segmental and somatic dysfunction of pelvic region: Secondary | ICD-10-CM | POA: Diagnosis not present

## 2016-02-04 DIAGNOSIS — M5136 Other intervertebral disc degeneration, lumbar region: Secondary | ICD-10-CM | POA: Diagnosis not present

## 2016-02-09 DIAGNOSIS — M5136 Other intervertebral disc degeneration, lumbar region: Secondary | ICD-10-CM | POA: Diagnosis not present

## 2016-02-09 DIAGNOSIS — M5432 Sciatica, left side: Secondary | ICD-10-CM | POA: Diagnosis not present

## 2016-02-09 DIAGNOSIS — M9903 Segmental and somatic dysfunction of lumbar region: Secondary | ICD-10-CM | POA: Diagnosis not present

## 2016-02-09 DIAGNOSIS — M9905 Segmental and somatic dysfunction of pelvic region: Secondary | ICD-10-CM | POA: Diagnosis not present

## 2016-02-11 DIAGNOSIS — M9903 Segmental and somatic dysfunction of lumbar region: Secondary | ICD-10-CM | POA: Diagnosis not present

## 2016-02-11 DIAGNOSIS — M5432 Sciatica, left side: Secondary | ICD-10-CM | POA: Diagnosis not present

## 2016-02-11 DIAGNOSIS — M9905 Segmental and somatic dysfunction of pelvic region: Secondary | ICD-10-CM | POA: Diagnosis not present

## 2016-02-11 DIAGNOSIS — M5136 Other intervertebral disc degeneration, lumbar region: Secondary | ICD-10-CM | POA: Diagnosis not present

## 2016-02-13 DIAGNOSIS — M5432 Sciatica, left side: Secondary | ICD-10-CM | POA: Diagnosis not present

## 2016-02-13 DIAGNOSIS — M9903 Segmental and somatic dysfunction of lumbar region: Secondary | ICD-10-CM | POA: Diagnosis not present

## 2016-02-13 DIAGNOSIS — M5136 Other intervertebral disc degeneration, lumbar region: Secondary | ICD-10-CM | POA: Diagnosis not present

## 2016-02-13 DIAGNOSIS — M9905 Segmental and somatic dysfunction of pelvic region: Secondary | ICD-10-CM | POA: Diagnosis not present

## 2016-02-16 DIAGNOSIS — M5136 Other intervertebral disc degeneration, lumbar region: Secondary | ICD-10-CM | POA: Diagnosis not present

## 2016-02-16 DIAGNOSIS — M9903 Segmental and somatic dysfunction of lumbar region: Secondary | ICD-10-CM | POA: Diagnosis not present

## 2016-02-16 DIAGNOSIS — M9905 Segmental and somatic dysfunction of pelvic region: Secondary | ICD-10-CM | POA: Diagnosis not present

## 2016-02-16 DIAGNOSIS — M5432 Sciatica, left side: Secondary | ICD-10-CM | POA: Diagnosis not present

## 2016-02-20 DIAGNOSIS — M5136 Other intervertebral disc degeneration, lumbar region: Secondary | ICD-10-CM | POA: Diagnosis not present

## 2016-02-20 DIAGNOSIS — M5432 Sciatica, left side: Secondary | ICD-10-CM | POA: Diagnosis not present

## 2016-02-20 DIAGNOSIS — M9905 Segmental and somatic dysfunction of pelvic region: Secondary | ICD-10-CM | POA: Diagnosis not present

## 2016-02-20 DIAGNOSIS — M9903 Segmental and somatic dysfunction of lumbar region: Secondary | ICD-10-CM | POA: Diagnosis not present

## 2016-02-23 DIAGNOSIS — M5136 Other intervertebral disc degeneration, lumbar region: Secondary | ICD-10-CM | POA: Diagnosis not present

## 2016-02-23 DIAGNOSIS — M9905 Segmental and somatic dysfunction of pelvic region: Secondary | ICD-10-CM | POA: Diagnosis not present

## 2016-02-23 DIAGNOSIS — M9903 Segmental and somatic dysfunction of lumbar region: Secondary | ICD-10-CM | POA: Diagnosis not present

## 2016-02-23 DIAGNOSIS — M5432 Sciatica, left side: Secondary | ICD-10-CM | POA: Diagnosis not present

## 2016-02-24 DIAGNOSIS — M79675 Pain in left toe(s): Secondary | ICD-10-CM | POA: Diagnosis not present

## 2016-02-24 DIAGNOSIS — L600 Ingrowing nail: Secondary | ICD-10-CM | POA: Diagnosis not present

## 2016-02-27 ENCOUNTER — Other Ambulatory Visit: Payer: Self-pay | Admitting: Family Medicine

## 2016-02-27 DIAGNOSIS — I10 Essential (primary) hypertension: Secondary | ICD-10-CM

## 2016-02-27 DIAGNOSIS — M5136 Other intervertebral disc degeneration, lumbar region: Secondary | ICD-10-CM | POA: Diagnosis not present

## 2016-02-27 DIAGNOSIS — M9905 Segmental and somatic dysfunction of pelvic region: Secondary | ICD-10-CM | POA: Diagnosis not present

## 2016-02-27 DIAGNOSIS — M5432 Sciatica, left side: Secondary | ICD-10-CM | POA: Diagnosis not present

## 2016-02-27 DIAGNOSIS — M9903 Segmental and somatic dysfunction of lumbar region: Secondary | ICD-10-CM | POA: Diagnosis not present

## 2016-03-02 DIAGNOSIS — M5136 Other intervertebral disc degeneration, lumbar region: Secondary | ICD-10-CM | POA: Diagnosis not present

## 2016-03-02 DIAGNOSIS — M5432 Sciatica, left side: Secondary | ICD-10-CM | POA: Diagnosis not present

## 2016-03-02 DIAGNOSIS — M9905 Segmental and somatic dysfunction of pelvic region: Secondary | ICD-10-CM | POA: Diagnosis not present

## 2016-03-02 DIAGNOSIS — M9903 Segmental and somatic dysfunction of lumbar region: Secondary | ICD-10-CM | POA: Diagnosis not present

## 2016-03-09 DIAGNOSIS — L600 Ingrowing nail: Secondary | ICD-10-CM | POA: Diagnosis not present

## 2016-03-09 DIAGNOSIS — M9905 Segmental and somatic dysfunction of pelvic region: Secondary | ICD-10-CM | POA: Diagnosis not present

## 2016-03-09 DIAGNOSIS — M5136 Other intervertebral disc degeneration, lumbar region: Secondary | ICD-10-CM | POA: Diagnosis not present

## 2016-03-09 DIAGNOSIS — M9903 Segmental and somatic dysfunction of lumbar region: Secondary | ICD-10-CM | POA: Diagnosis not present

## 2016-03-09 DIAGNOSIS — M79675 Pain in left toe(s): Secondary | ICD-10-CM | POA: Diagnosis not present

## 2016-03-09 DIAGNOSIS — M5432 Sciatica, left side: Secondary | ICD-10-CM | POA: Diagnosis not present

## 2016-03-16 DIAGNOSIS — M9905 Segmental and somatic dysfunction of pelvic region: Secondary | ICD-10-CM | POA: Diagnosis not present

## 2016-03-16 DIAGNOSIS — M9903 Segmental and somatic dysfunction of lumbar region: Secondary | ICD-10-CM | POA: Diagnosis not present

## 2016-03-16 DIAGNOSIS — M5136 Other intervertebral disc degeneration, lumbar region: Secondary | ICD-10-CM | POA: Diagnosis not present

## 2016-03-16 DIAGNOSIS — M5432 Sciatica, left side: Secondary | ICD-10-CM | POA: Diagnosis not present

## 2016-03-23 ENCOUNTER — Other Ambulatory Visit: Payer: Self-pay | Admitting: Otolaryngology

## 2016-03-23 DIAGNOSIS — R42 Dizziness and giddiness: Secondary | ICD-10-CM

## 2016-03-23 DIAGNOSIS — H8110 Benign paroxysmal vertigo, unspecified ear: Secondary | ICD-10-CM | POA: Diagnosis not present

## 2016-03-23 DIAGNOSIS — H8113 Benign paroxysmal vertigo, bilateral: Secondary | ICD-10-CM | POA: Diagnosis not present

## 2016-03-29 ENCOUNTER — Ambulatory Visit
Admission: RE | Admit: 2016-03-29 | Discharge: 2016-03-29 | Disposition: A | Discharge status: Home / Self Care | Payer: Medicare Other | Source: Ambulatory Visit | Attending: Otolaryngology | Admitting: Otolaryngology

## 2016-03-29 DIAGNOSIS — R42 Dizziness and giddiness: Secondary | ICD-10-CM | POA: Diagnosis not present

## 2016-03-30 ENCOUNTER — Ambulatory Visit: Payer: Medicare Other

## 2016-03-30 DIAGNOSIS — L600 Ingrowing nail: Secondary | ICD-10-CM | POA: Diagnosis not present

## 2016-03-30 DIAGNOSIS — M79675 Pain in left toe(s): Secondary | ICD-10-CM | POA: Diagnosis not present

## 2016-04-08 DIAGNOSIS — R42 Dizziness and giddiness: Secondary | ICD-10-CM | POA: Diagnosis not present

## 2016-04-26 DIAGNOSIS — R42 Dizziness and giddiness: Secondary | ICD-10-CM | POA: Diagnosis not present

## 2016-05-13 ENCOUNTER — Other Ambulatory Visit: Payer: Self-pay | Admitting: Family Medicine

## 2016-05-13 NOTE — Telephone Encounter (Signed)
Last OV: 05/15/15 Next OV: 06/10/16  Lab Results  Component Value Date   TSH 1.180 05/15/2015

## 2016-06-10 ENCOUNTER — Ambulatory Visit (INDEPENDENT_AMBULATORY_CARE_PROVIDER_SITE_OTHER): Payer: Medicare Other | Admitting: Family Medicine

## 2016-06-10 ENCOUNTER — Encounter: Payer: Self-pay | Admitting: Family Medicine

## 2016-06-10 VITALS — BP 104/75 | HR 93 | Ht 66.54 in | Wt 227.0 lb

## 2016-06-10 DIAGNOSIS — Z1329 Encounter for screening for other suspected endocrine disorder: Secondary | ICD-10-CM | POA: Diagnosis not present

## 2016-06-10 DIAGNOSIS — N401 Enlarged prostate with lower urinary tract symptoms: Secondary | ICD-10-CM

## 2016-06-10 DIAGNOSIS — Z7189 Other specified counseling: Secondary | ICD-10-CM | POA: Insufficient documentation

## 2016-06-10 DIAGNOSIS — D510 Vitamin B12 deficiency anemia due to intrinsic factor deficiency: Secondary | ICD-10-CM | POA: Diagnosis not present

## 2016-06-10 DIAGNOSIS — N138 Other obstructive and reflux uropathy: Secondary | ICD-10-CM | POA: Diagnosis not present

## 2016-06-10 DIAGNOSIS — Z0000 Encounter for general adult medical examination without abnormal findings: Secondary | ICD-10-CM | POA: Diagnosis not present

## 2016-06-10 DIAGNOSIS — Z125 Encounter for screening for malignant neoplasm of prostate: Secondary | ICD-10-CM

## 2016-06-10 DIAGNOSIS — E538 Deficiency of other specified B group vitamins: Secondary | ICD-10-CM | POA: Diagnosis not present

## 2016-06-10 DIAGNOSIS — I10 Essential (primary) hypertension: Secondary | ICD-10-CM

## 2016-06-10 DIAGNOSIS — E039 Hypothyroidism, unspecified: Secondary | ICD-10-CM

## 2016-06-10 DIAGNOSIS — Z13220 Encounter for screening for lipoid disorders: Secondary | ICD-10-CM | POA: Diagnosis not present

## 2016-06-10 LAB — URINALYSIS, ROUTINE W REFLEX MICROSCOPIC
Bilirubin, UA: NEGATIVE
GLUCOSE, UA: NEGATIVE
Ketones, UA: NEGATIVE
NITRITE UA: NEGATIVE
PH UA: 6.5 (ref 5.0–7.5)
Protein, UA: NEGATIVE
Specific Gravity, UA: 1.01 (ref 1.005–1.030)
Urobilinogen, Ur: 0.2 mg/dL (ref 0.2–1.0)

## 2016-06-10 LAB — MICROSCOPIC EXAMINATION
Epithelial Cells (non renal): NONE SEEN /hpf (ref 0–10)
RBC MICROSCOPIC, UA: NONE SEEN /HPF (ref 0–?)

## 2016-06-10 MED ORDER — LOSARTAN POTASSIUM-HCTZ 100-25 MG PO TABS: 1.0000 | ORAL_TABLET | Freq: Every day | ORAL | 4 refills | Status: DC

## 2016-06-10 MED ORDER — LEVOTHYROXINE SODIUM 137 MCG PO TABS: 137.0000 ug | ORAL_TABLET | Freq: Every day | ORAL | 4 refills | Status: DC

## 2016-06-10 MED ORDER — AZELASTINE HCL 0.1 % NA SOLN: 1.0000 | Freq: Two times a day (BID) | NASAL | 12 refills | Status: DC

## 2016-06-10 NOTE — Assessment & Plan Note (Signed)
Labs pending as has not been taking B12

## 2016-06-10 NOTE — Assessment & Plan Note (Signed)
The current medical regimen is effective;  continue present plan and medications.  

## 2016-06-10 NOTE — Assessment & Plan Note (Signed)
A voluntary discussion about advance care planning including the explanation and discussion of advance directives was extensively discussed  with the patient.  Explanation about the health care proxy and Living will was reviewed and packet with forms with explanation of how to fill them out was given.  .  Patient was offered a separate Advance Care Planning visit for further assistance with forms.    

## 2016-06-10 NOTE — Progress Notes (Addendum)
BP 104/75   Pulse 93   Ht 5' 6.53" (1.69 m)   Wt 227 lb (103 kg)   SpO2 98%   BMI 36.05 kg/m    Subjective:    Patient ID: Joshua [name], male    DOB: 01-25-1951, 66 y.o.   MRN: [id number]  HPI: Joshua [name] is a 66 y.o. male  Chief Complaint  Patient presents with  . Annual Exam  AWV metrics met Patient follow-up taking medications faithfully without problems taking thyroid with no thyroid symptoms blood pressure good control no signs or symptoms. Taking Astelinfor allergies with good control. Has some minor memory and word finding issues but nothing major's not interested in further evaluation at this point   Relevant past medical, surgical, family and social history reviewed and updated as indicated. Interim medical history since our last visit reviewed. Allergies and medications reviewed and updated.  Review of Systems  Constitutional: Negative.   HENT: Negative.   Eyes: Negative.   Respiratory: Negative.   Cardiovascular: Negative.   Gastrointestinal: Negative.   Endocrine: Negative.   Genitourinary: Negative.   Musculoskeletal: Negative.   Skin: Negative.   Allergic/Immunologic: Negative.   Neurological: Negative.   Hematological: Negative.   Psychiatric/Behavioral: Negative.     Per HPI unless specifically indicated above     Objective:    BP 104/75   Pulse 93   Ht 5' 6.53" (1.69 m)   Wt 227 lb (103 kg)   SpO2 98%   BMI 36.05 kg/m   Wt Readings from Last 3 Encounters:  06/10/16 227 lb (103 kg)  09/16/15 212 lb (96.2 kg)  06/10/15 213 lb 11.2 oz (96.9 kg)    Physical Exam  Constitutional: He is oriented to person, place, and time. He appears well-developed and well-nourished.  HENT:  Head: Normocephalic and atraumatic.  Right Ear: External ear normal.  Left Ear: External ear normal.  Eyes: Conjunctivae and EOM are normal. Pupils are equal, round, and reactive to light.  Neck: Normal range of motion. Neck supple.  Cardiovascular:  Normal rate, regular rhythm, normal heart sounds and intact distal pulses.   Pulmonary/Chest: Effort normal and breath sounds normal.  Abdominal: Soft. Bowel sounds are normal. There is no splenomegaly or hepatomegaly.  Genitourinary: Rectum normal and penis normal.  Genitourinary Comments: BPH changes  Musculoskeletal: Normal range of motion.  Neurological: He is alert and oriented to person, place, and time. He has normal reflexes.  Skin: No rash noted. No erythema.  Psychiatric: He has a normal mood and affect. His behavior is normal. Judgment and thought content normal.    Results for orders placed or performed in visit on 06/10/15  Microscopic Examination  Result Value Ref Range   WBC, UA None seen 0 - 5 /hpf   RBC, UA None seen 0 - 2 /hpf   Epithelial Cells (non renal) None seen 0 - 10 /hpf   Bacteria, UA None seen None seen/Few  Urinalysis, Complete  Result Value Ref Range   Specific Gravity, UA 1.010 1.005 - 1.030   pH, UA 6.0 5.0 - 7.5   Color, UA Yellow Yellow   Appearance Ur Clear Clear   Leukocytes, UA Negative Negative   Protein, UA Negative Negative/Trace   Glucose, UA Negative Negative   Ketones, UA Negative Negative   RBC, UA Negative Negative   Bilirubin, UA Negative Negative   Urobilinogen, Ur 0.2 0.2 - 1.0 mg/dL   Nitrite, UA Negative Negative   Microscopic Examination See below:  Bladder Scan (Post Void Residual) in office  Result Value Ref Range   Scan Result 84       Assessment & Plan:   Problem List Items Addressed This Visit      Cardiovascular and Mediastinum   Benign essential HTN    The current medical regimen is effective;  continue present plan and medications.       Relevant Medications   losartan-hydrochlorothiazide (HYZAAR) 100-25 MG tablet   Other Relevant Orders   CBC with Differential/Platelet   Comprehensive metabolic panel   Urinalysis, Routine w reflex microscopic     Endocrine   Hypothyroidism    The current medical  regimen is effective;  continue present plan and medications.       Relevant Medications   levothyroxine (SYNTHROID, LEVOTHROID) 137 MCG tablet   Other Relevant Orders   CBC with Differential/Platelet   Comprehensive metabolic panel   TSH   Urinalysis, Routine w reflex microscopic     Genitourinary   BPH with obstruction/lower urinary tract symptoms    The current medical regimen is effective;  continue present plan and medications.       Relevant Orders   CBC with Differential/Platelet   Comprehensive metabolic panel   PSA   Urinalysis, Routine w reflex microscopic     Other   B12 deficiency    Labs pending as has not been taking B12      Screening for prostate cancer   Relevant Orders   PSA   Pernicious anemia   Advanced care planning/counseling discussion    A voluntary discussion about advance care planning including the explanation and discussion of advance directives was extensively discussed  with the patient.  Explanation about the health care proxy and Living will was reviewed and packet with forms with explanation of how to fill them out was given.    Patient was offered a separate [place] visit for further assistance with forms.          Other Visit Diagnoses    Annual physical exam    -  Primary   Relevant Orders   CBC with Differential/Platelet   Comprehensive metabolic panel   Lipid panel   PSA   TSH   Urinalysis, Routine w reflex microscopic   Screening cholesterol level       Relevant Orders   Lipid panel   Thyroid disorder screen       Relevant Orders   TSH       Follow up plan: Return in about 6 months (around 12/11/2016) for BMP.

## 2016-06-10 NOTE — Addendum Note (Signed)
Addended by: Gerda Diss A on: 06/10/2016 11:33 AM   Modules accepted: Orders

## 2016-06-11 LAB — VITAMIN B12: VITAMIN B 12: 450 pg/mL (ref 232–1245)

## 2016-06-11 LAB — CBC WITH DIFFERENTIAL/PLATELET
BASOS: 1 %
Basophils Absolute: 0 10*3/uL (ref 0.0–0.2)
EOS (ABSOLUTE): 0.1 10*3/uL (ref 0.0–0.4)
EOS: 2 %
HEMATOCRIT: 36.7 % — AB (ref 37.5–51.0)
HEMOGLOBIN: 12.7 g/dL — AB (ref 13.0–17.7)
IMMATURE GRANS (ABS): 0 10*3/uL (ref 0.0–0.1)
Immature Granulocytes: 0 %
LYMPHS ABS: 1.6 10*3/uL (ref 0.7–3.1)
Lymphs: 30 %
MCH: 32.2 pg (ref 26.6–33.0)
MCHC: 34.6 g/dL (ref 31.5–35.7)
MCV: 93 fL (ref 79–97)
MONOCYTES: 11 %
Monocytes Absolute: 0.6 10*3/uL (ref 0.1–0.9)
NEUTROS ABS: 3 10*3/uL (ref 1.4–7.0)
Neutrophils: 56 %
Platelets: 224 10*3/uL (ref 150–379)
RBC: 3.94 x10E6/uL — ABNORMAL LOW (ref 4.14–5.80)
RDW: 13.1 % (ref 12.3–15.4)
WBC: 5.4 10*3/uL (ref 3.4–10.8)

## 2016-06-11 LAB — PSA: PROSTATE SPECIFIC AG, SERUM: 3.1 ng/mL (ref 0.0–4.0)

## 2016-06-11 LAB — COMPREHENSIVE METABOLIC PANEL
A/G RATIO: 1.5 (ref 1.2–2.2)
ALT: 16 IU/L (ref 0–44)
AST: 18 IU/L (ref 0–40)
Albumin: 4.2 g/dL (ref 3.6–4.8)
Alkaline Phosphatase: 63 IU/L (ref 39–117)
BILIRUBIN TOTAL: 0.5 mg/dL (ref 0.0–1.2)
BUN / CREAT RATIO: 19 (ref 10–24)
BUN: 22 mg/dL (ref 8–27)
CHLORIDE: 99 mmol/L (ref 96–106)
CO2: 24 mmol/L (ref 18–29)
Calcium: 10.1 mg/dL (ref 8.6–10.2)
Creatinine, Ser: 1.13 mg/dL (ref 0.76–1.27)
GFR calc non Af Amer: 66 mL/min/{1.73_m2} (ref >59)
GFR, EST AFRICAN AMERICAN: 77 mL/min/{1.73_m2} (ref >59)
Globulin, Total: 2.8 g/dL (ref 1.5–4.5)
Glucose: 117 mg/dL — ABNORMAL HIGH (ref 65–99)
POTASSIUM: 3.5 mmol/L (ref 3.5–5.2)
Sodium: 140 mmol/L (ref 134–144)
TOTAL PROTEIN: 7 g/dL (ref 6.0–8.5)

## 2016-06-11 LAB — LIPID PANEL
Chol/HDL Ratio: 4.4 ratio (ref 0.0–5.0)
Cholesterol, Total: 169 mg/dL (ref 100–199)
HDL: 38 mg/dL — ABNORMAL LOW
LDL Calculated: 85 mg/dL (ref 0–99)
Triglycerides: 230 mg/dL — ABNORMAL HIGH (ref 0–149)
VLDL Cholesterol Cal: 46 mg/dL — ABNORMAL HIGH (ref 5–40)

## 2016-06-11 LAB — TSH: TSH: 0.919 u[IU]/mL (ref 0.450–4.500)

## 2016-06-14 ENCOUNTER — Encounter: Payer: Self-pay | Admitting: Family Medicine

## 2016-07-30 ENCOUNTER — Ambulatory Visit (INDEPENDENT_AMBULATORY_CARE_PROVIDER_SITE_OTHER): Payer: Medicare Other | Admitting: Urology

## 2016-07-30 ENCOUNTER — Encounter: Payer: Self-pay | Admitting: Urology

## 2016-07-30 VITALS — BP 98/64 | HR 90 | Ht 66.0 in | Wt 215.2 lb

## 2016-07-30 DIAGNOSIS — N390 Urinary tract infection, site not specified: Secondary | ICD-10-CM | POA: Diagnosis not present

## 2016-07-30 DIAGNOSIS — M545 Low back pain, unspecified: Secondary | ICD-10-CM

## 2016-07-30 DIAGNOSIS — N200 Calculus of kidney: Secondary | ICD-10-CM | POA: Diagnosis not present

## 2016-07-30 LAB — MICROSCOPIC EXAMINATION
RBC MICROSCOPIC, UA: NONE SEEN /HPF (ref 0–?)
WBC, UA: >30 /hpf — ABNORMAL HIGH (ref 0–?)

## 2016-07-30 LAB — URINALYSIS, COMPLETE
BILIRUBIN UA: NEGATIVE
GLUCOSE, UA: NEGATIVE
KETONES UA: NEGATIVE
NITRITE UA: NEGATIVE
Protein, UA: NEGATIVE
RBC UA: NEGATIVE
Urobilinogen, Ur: 0.2 mg/dL (ref 0.2–1.0)
pH, UA: 5.5 (ref 5.0–7.5)

## 2016-07-30 MED ORDER — CEPHALEXIN 500 MG PO CAPS: 500.0000 mg | ORAL_CAPSULE | Freq: Four times a day (QID) | ORAL | 0 refills | Status: DC

## 2016-07-30 NOTE — Progress Notes (Signed)
07/30/2016 11:25 AM   [name] 1951/01/04 [id number]  Referring provider: Guadalupe Maple, MD [address] [place], [place] [address]  Chief Complaint  Patient presents with  . Nephrolithiasis    HPI: 1 - Lower Urinary Tract Symptoms - Pt with slowly progressive bother from mostly obstructive symptoms with weak stream, straining, hesitancy, and prolonged post-void dribble, No irritative symptoms. PVR 2017 "39mL" (not severely elevated). DRE 2017 35gm (not significantly enlarged) but cysto does confirm intraluminal hypertrophy.  He gives history of what sound like stricture dilation years ago but cysto 06/2015 confirms no lower tract significant sricture disesae.  Given trial of tamsulosin by PCP but developed hives. Insurance denied rapaflo and daily cialis. Now on alfuzosin 10 QD which he states makes symptoms WORSE and therefore stopped. At this point his level of bother is not so much he wants therapy.   2 - Prostate Screening - No FHX prostate cancer 06/2016 PSA 3.1 / DRE 35gm smooth. Receives screening with PCP.  3 - Erectile Dysfunction - pt with moderate bother from lack of firmness during intercourse. Unaided is able to penetrate 100% and maintian to orgasm 100% of time, just "softer" than when younger. No nitrates. After consideration of pharmacotherapy he desires observation.  PMH sig for jaw surgery, obesity. No CV disease / blood thinners. 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  Today "calin" is seen in f/u for lower back pain and chills. He states that the pain started in his right lower back and is since radiated to the left side. The pain is not on the left side but improving. He first started 4-5 days ago after he helped lift his wife who has difficulty ambulating. Nothing makes it better or worse. He denies dysuria at this time. He does note that his stream is weaker over the last week or so. His baseline is a much better stream. Other than that is  no suprapubic or flank pain. He does have history of nephrolithiasis, it is considerably distorted good. He also has had UTIs in the past which have felt similar to this as well.  Urinalysis does show leukocyte esterase, white blood cells, and bacteria. It is negative for red blood cells.   PMH: Past Medical History:  Diagnosis Date  . Allergy   . Amnesia 06/13/2014  . B12 deficiency 06/13/2014  . Benign essential HTN 10/30/2014  . BPH (benign prostatic hyperplasia)   . BPH with obstruction/lower urinary tract symptoms 05/15/2015  . Hypogonadism in male   . Hypothyroidism 10/30/2014    Surgical History: Past Surgical History:  Procedure Laterality Date  . COLONOSCOPY  March 2016  . ESOPHAGOGASTRODUODENOSCOPY ENDOSCOPY  March 2016  . EYE SURGERY Bilateral    cataracts  . KNEE SURGERY Right   . SHOULDER SURGERY Left   . TONSILLECTOMY      Home Medications:  Allergies as of 07/30/2016      Reactions   Aspirin Hives   Celery Oil Swelling   Flomax [tamsulosin Hcl] Hives   Hydromorphone Hives   Latex Rash      Medication List       Accurate as of 07/30/16 11:25 AM. Always use your most recent med list.          alfuzosin 10 MG 24 hr tablet Commonly known as:  UROXATRAL   aspirin EC 81 MG tablet Take 81 mg by mouth daily.   azelastine 0.1 % nasal spray Commonly known as:  ASTELIN  Place 1 spray into both nostrils 2 (two) times daily. Use in each nostril as directed   levothyroxine 137 MCG tablet Commonly known as:  SYNTHROID, LEVOTHROID Take 1 tablet (137 mcg total) by mouth daily before breakfast.   losartan-hydrochlorothiazide 100-25 MG tablet Commonly known as:  HYZAAR Take 1 tablet by mouth daily.   meclizine 25 MG tablet Commonly known as:  ANTIVERT Take 1 tablet (25 mg total) by mouth 3 (three) times daily as needed.   RA VITAMIN B-12 TR 1000 MCG Tbcr Generic drug:  Cyanocobalamin Take 1,000 mcg by mouth daily.       Allergies:  Allergies    Allergen Reactions  . Aspirin Hives  . Celery Oil Swelling  . Flomax [Tamsulosin Hcl] Hives  . Hydromorphone Hives  . Latex Rash    Family History: Family History  Problem Relation Age of Onset  . Cancer Mother        breast  . COPD Father   . Heart disease Father   . Prostate cancer Father     Social History:  reports that he has never smoked. He has never used smokeless tobacco. He reports that he drinks alcohol. He reports that he does not use drugs.  ROS: UROLOGY Frequent Urination?: No Hard to postpone urination?: Yes Burning/pain with urination?: No Get up at night to urinate?: No Leakage of urine?: No Urine stream starts and stops?: No Trouble starting stream?: No Do you have to strain to urinate?: Yes Blood in urine?: No Urinary tract infection?: No Sexually transmitted disease?: No Injury to kidneys or bladder?: No Painful intercourse?: No Weak stream?: Yes Erection problems?: No Penile pain?: No  Gastrointestinal Nausea?: No Vomiting?: No Indigestion/heartburn?: No Diarrhea?: No Constipation?: No  Constitutional Fever: No Night sweats?: Yes Weight loss?: No Fatigue?: No  Skin Skin rash/lesions?: No Itching?: No  Eyes Blurred vision?: No Double vision?: No  Ears/Nose/Throat Sore throat?: No Sinus problems?: No  Hematologic/Lymphatic Swollen glands?: No Easy bruising?: No  Cardiovascular Leg swelling?: No Chest pain?: Yes  Respiratory Cough?: No Shortness of breath?: No  Endocrine Excessive thirst?: No  Musculoskeletal Back pain?: Yes Joint pain?: Yes  Neurological Headaches?: No Dizziness?: No  Psychologic Depression?: No Anxiety?: No  Physical Exam: BP 98/64 (BP Location: Left Arm, Patient Position: Sitting, Cuff Size: Normal)   Pulse 90   Ht 5\' 6"  (1.676 m)   Wt 215 lb 3.2 oz (97.6 kg)   BMI 34.73 kg/m   Constitutional:  Alert and oriented, No acute distress. HEENT: [place] AT, moist mucus membranes.  Trachea  midline, no masses. Cardiovascular: No clubbing, cyanosis, or edema. Respiratory: Normal respiratory effort, no increased work of breathing. GI: Abdomen is soft, nontender, nondistended, no abdominal masses GU: No CVA tenderness. DRE: 35 g benign. No sign of prostatitis. Skin: No rashes, bruises or suspicious lesions. Lymph: No cervical or inguinal adenopathy. Neurologic: Grossly intact, no focal deficits, moving all 4 extremities. Psychiatric: Normal mood and affect.  Laboratory Data: Lab Results  Component Value Date   WBC 5.4 06/10/2016   HGB 12.7 (L) 06/10/2016   HCT 36.7 (L) 06/10/2016   MCV 93 06/10/2016   PLT 224 06/10/2016    Lab Results  Component Value Date   CREATININE 1.13 06/10/2016    No results found for: PSA  No results found for: TESTOSTERONE  No results found for: HGBA1C  Urinalysis    Component Value Date/Time   APPEARANCEUR Clear 06/10/2016 1038   GLUCOSEU Negative 06/10/2016 1038   BILIRUBINUR  Negative 06/10/2016 1038   PROTEINUR Negative 06/10/2016 1038   NITRITE Negative 06/10/2016 1038   LEUKOCYTESUR 2+ (A) 06/10/2016 1038    Assessment & Plan:    1. Possible UTI 2. Bilateral lower back pain 3. History of nephrolithiasis I did discuss the patient this is unlikely obstructive uropathy as his pain is switched sides and radiates across midline. His urinalysis also was negative for microscopic hematuria. He has not experienced gross hematuria. His urinalysis is somewhat concerning for urinary tract infection. We will send for culture and start him on Keflex in the interim. He is agreeable to stopping the Keflex if his urine culture returns negative. I do think his symptoms are more consistent with musculoskeletal in origin. He does report hives from aspirin, but he still takes a baby aspirin every day without difficulty. He has no known allergy to other NSAIDs such as ibuprofen. It was therefore recommended to him that he take Advil 600 mg 3 times a  day on a scheduled basis as well as ice the area. If his urine culture is negative and his pain persists, I have advised the patient that at that time we will refer him back to his primary care provider for further evaluation as his pain is likely not urological in origin.    No Follow-up on file.  Nickie Retort, MD  [facility] [address] [place], [place] [address] [phone]

## 2016-08-04 LAB — CULTURE, URINE COMPREHENSIVE

## 2016-10-22 DIAGNOSIS — M170 Bilateral primary osteoarthritis of knee: Secondary | ICD-10-CM | POA: Diagnosis not present

## 2016-11-08 DIAGNOSIS — Z23 Encounter for immunization: Secondary | ICD-10-CM | POA: Diagnosis not present

## 2016-12-06 DIAGNOSIS — M9905 Segmental and somatic dysfunction of pelvic region: Secondary | ICD-10-CM | POA: Diagnosis not present

## 2016-12-06 DIAGNOSIS — M5136 Other intervertebral disc degeneration, lumbar region: Secondary | ICD-10-CM | POA: Diagnosis not present

## 2016-12-06 DIAGNOSIS — M5432 Sciatica, left side: Secondary | ICD-10-CM | POA: Diagnosis not present

## 2016-12-06 DIAGNOSIS — M9903 Segmental and somatic dysfunction of lumbar region: Secondary | ICD-10-CM | POA: Diagnosis not present

## 2016-12-07 DIAGNOSIS — M5432 Sciatica, left side: Secondary | ICD-10-CM | POA: Diagnosis not present

## 2016-12-07 DIAGNOSIS — M9905 Segmental and somatic dysfunction of pelvic region: Secondary | ICD-10-CM | POA: Diagnosis not present

## 2016-12-07 DIAGNOSIS — M5136 Other intervertebral disc degeneration, lumbar region: Secondary | ICD-10-CM | POA: Diagnosis not present

## 2016-12-07 DIAGNOSIS — M9903 Segmental and somatic dysfunction of lumbar region: Secondary | ICD-10-CM | POA: Diagnosis not present

## 2016-12-10 DIAGNOSIS — M9905 Segmental and somatic dysfunction of pelvic region: Secondary | ICD-10-CM | POA: Diagnosis not present

## 2016-12-10 DIAGNOSIS — M5136 Other intervertebral disc degeneration, lumbar region: Secondary | ICD-10-CM | POA: Diagnosis not present

## 2016-12-10 DIAGNOSIS — M5432 Sciatica, left side: Secondary | ICD-10-CM | POA: Diagnosis not present

## 2016-12-10 DIAGNOSIS — M9903 Segmental and somatic dysfunction of lumbar region: Secondary | ICD-10-CM | POA: Diagnosis not present

## 2016-12-11 IMAGING — MR MR HEAD W/O CM
9 of 10 series · 40 of 48 positions shown · non-contrast
Comparison: Head CT without contrast 10/23/2006.

CLINICAL DATA: 64-year-old male with short-term memory loss for
several months, ongoing. Initial encounter.

EXAM:
MRI HEAD WITHOUT CONTRAST
TECHNIQUE: Multiplanar, multiecho pulse sequences of the brain and surrounding
structures were obtained without intravenous contrast.

[Series 2: T1 · sagittal · 5.0mm · 0.45mm/px · 5 of 25 slices shown]
[im 1/25]
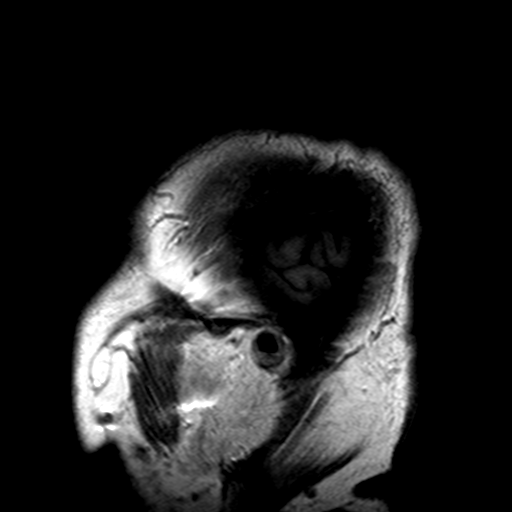
[im 7/25]
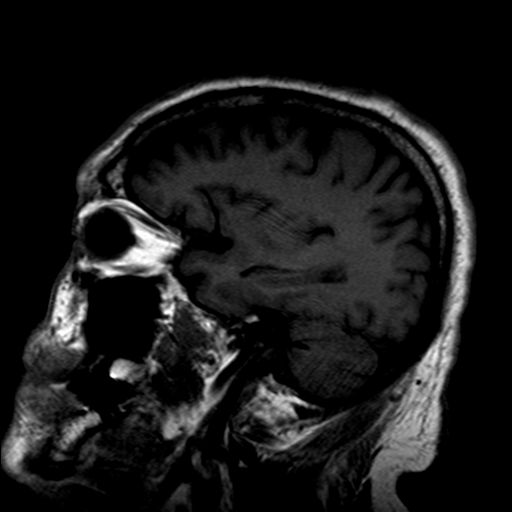
[im 13/25]
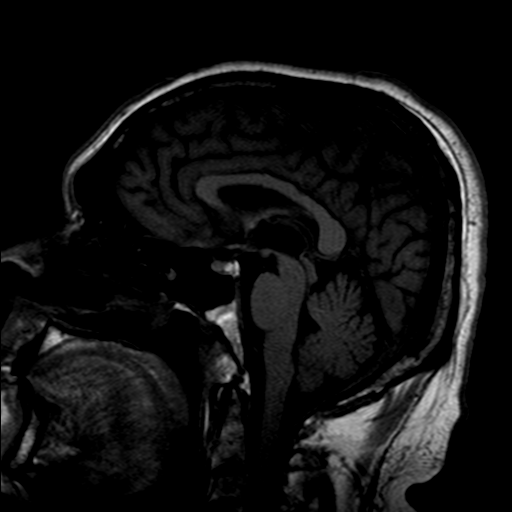
[im 19/25]
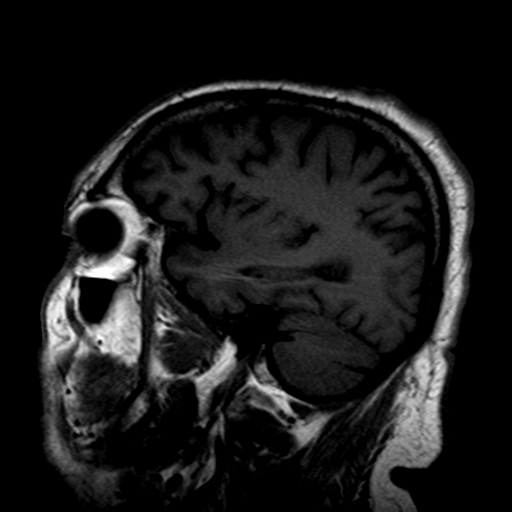
[im 25/25]
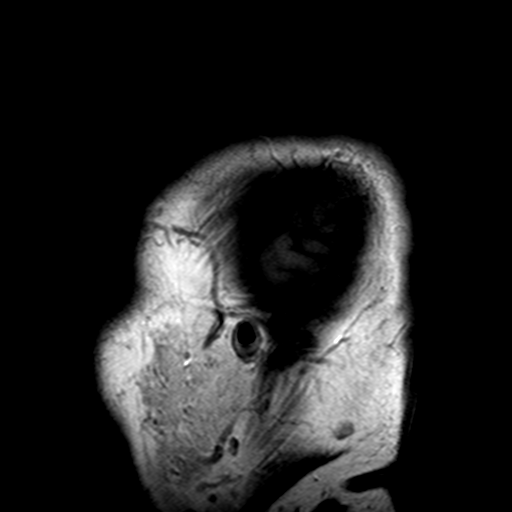

[Series 4: DWI · axial · 4.0mm · 0.94mm/px · z∈[-99,+70]mm · 6 of 44 slices shown (1 of 4)]
[im 1/44]
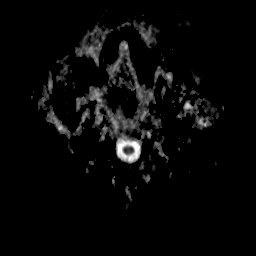
[im 9/44]
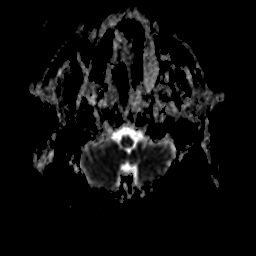
[im 18/44]
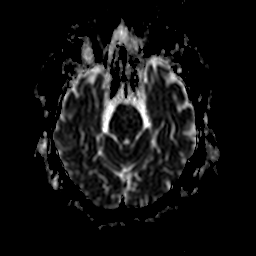
[im 26/44]
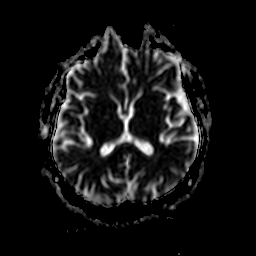
[im 35/44]
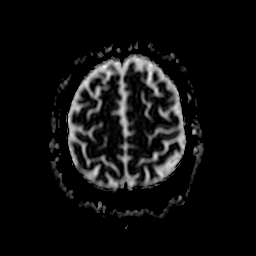
[im 44/44]
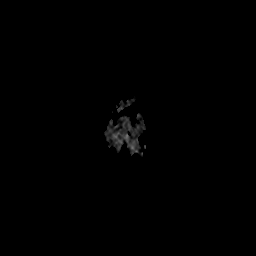

[Series 5: DWI · axial · 4.0mm · 0.94mm/px · z∈[-99,+70]mm · 6 of 44 slices shown (2 of 4)]
[im 1/44]
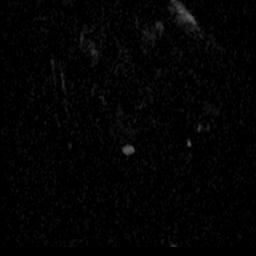
[im 9/44]
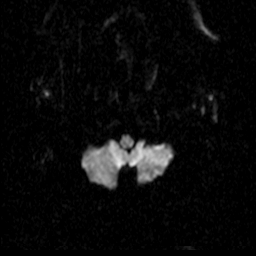
[im 18/44]
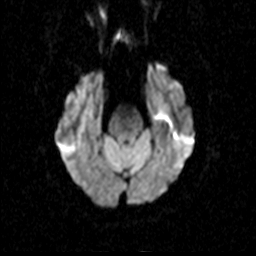
[im 26/44]
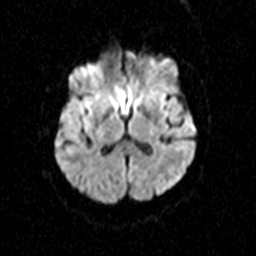
[im 35/44]
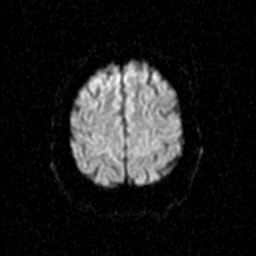
[im 44/44]
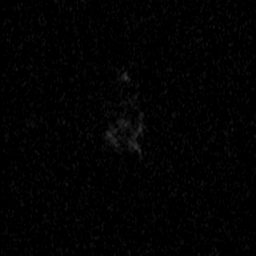

[Series 7: DWI · coronal · 5.0mm · 1.80mm/px · 5 of 35 slices shown (3 of 4)]
[im 1/35]
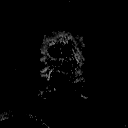
[im 9/35]
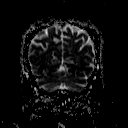
[im 18/35]
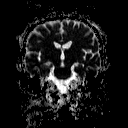
[im 26/35]
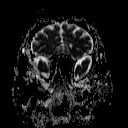
[im 35/35]
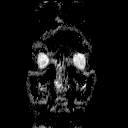

[Series 8: T2 · axial · 5.0mm · 0.45mm/px · z∈[-84,+56]mm · 3 of 23 slices shown (1 of 3)]
[im 1/23]
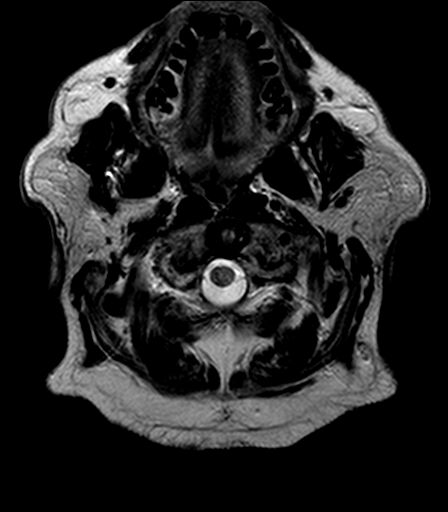
[im 12/23]
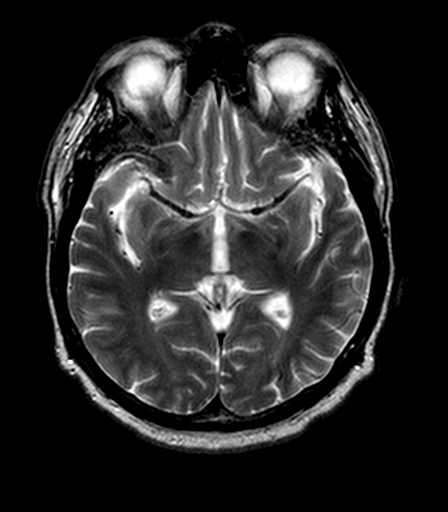
[im 23/23]
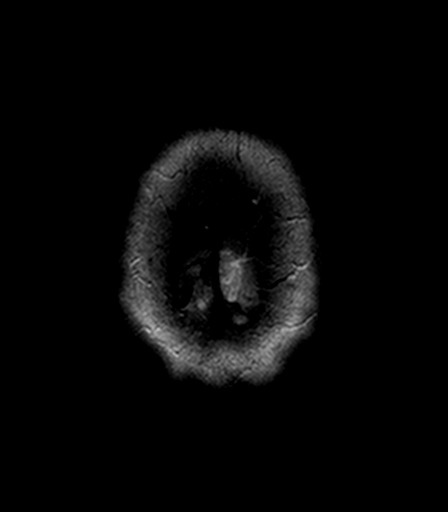

[Series 9: FLAIR · axial · 5.0mm · 0.90mm/px · z∈[-84,+56]mm · 3 of 23 slices shown]
[im 1/23]
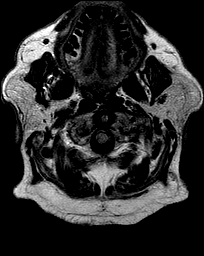
[im 12/23]
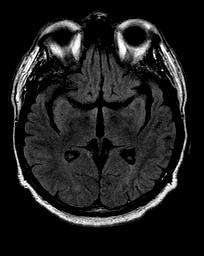
[im 23/23]
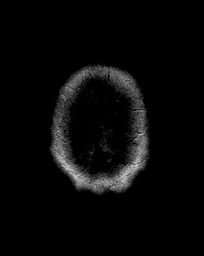

[Series 10: DWI · coronal · 5.0mm · 1.80mm/px · 5 of 35 slices shown (4 of 4)]
[im 1/35]
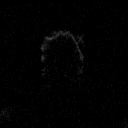
[im 9/35]
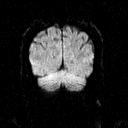
[im 18/35]
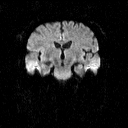
[im 26/35]
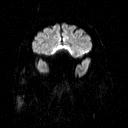
[im 35/35]
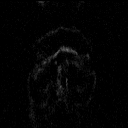

[Series 11: T2 · axial · 5.0mm · 0.45mm/px · z∈[-87,+54]mm · 3 of 23 slices shown (2 of 3)]
[im 1/23]
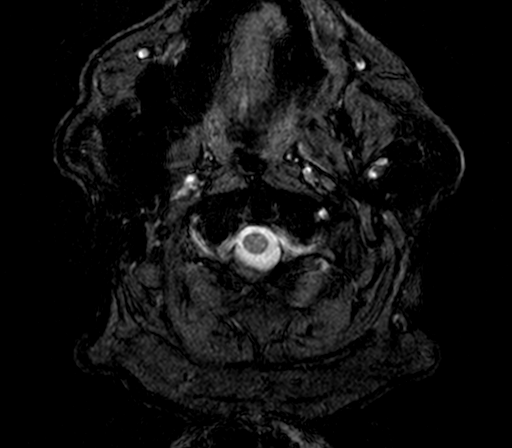
[im 12/23]
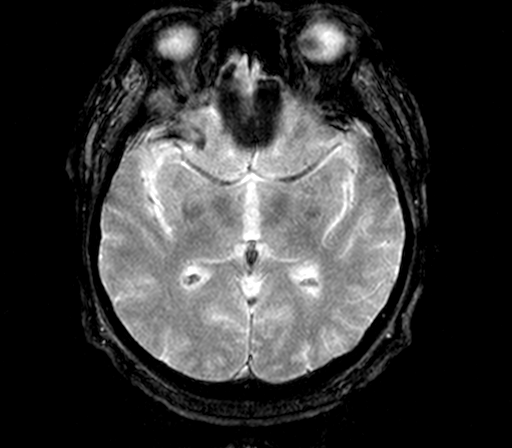
[im 23/23]
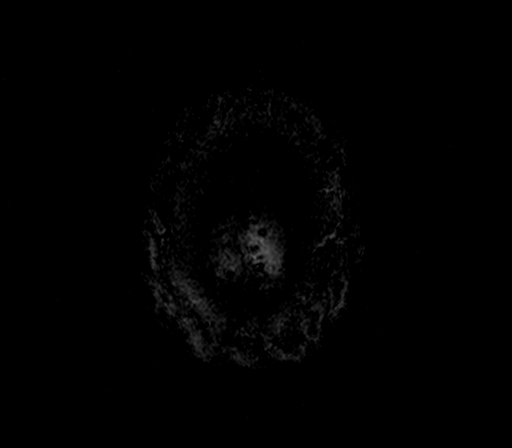

[Series 13: T2 · coronal · 5.0mm · 0.45mm/px · 4 of 27 slices shown (3 of 3)]
[im 1/27]
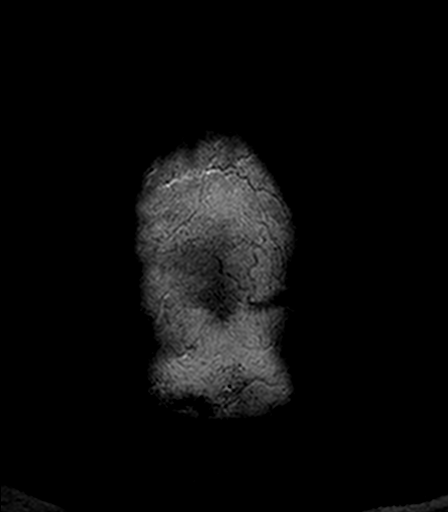
[im 9/27]
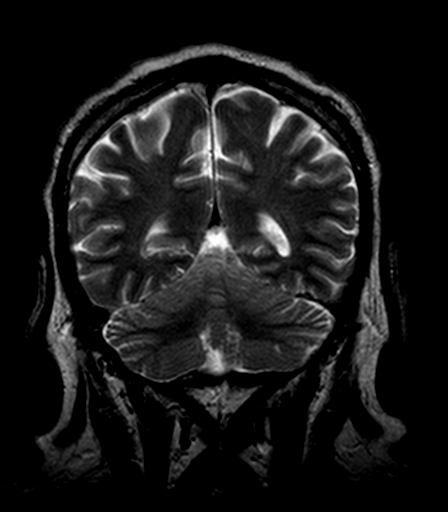
[im 18/27]
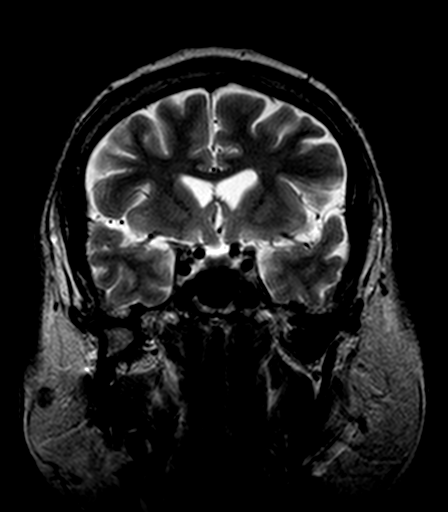
[im 27/27]
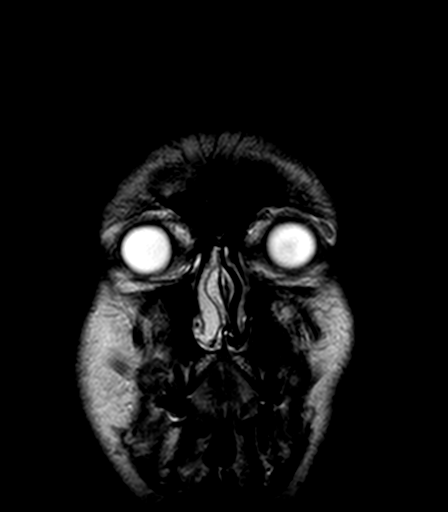

[40 of 48 positions shown; findings below may reference images not displayed]

FINDINGS: Cerebral volume is within normal limits for age. No restricted
diffusion to suggest acute infarction. No midline shift, mass
effect, evidence of mass lesion, ventriculomegaly, extra-axial
collection or acute intracranial hemorrhage. Cervicomedullary
junction and pituitary are within normal limits. Major intracranial
vascular flow voids are within normal limits.

Occasional tiny chronic lacunar infarcts in the left cerebellum.
Deep gray matter nuclei and brainstem are within normal limits for
age. Supratentorial gray and white matter signal is normal for age.
Mesial temporal lobe structures appear normal. No cortical
encephalomalacia or chronic cerebral blood products identified.

Visible internal auditory structures appear normal. Postoperative
changes to both globes. Mild left maxillary sinus chronic mucosal
thickening or mucous retention cyst. Other visualized paranasal
sinuses and mastoids are clear. Negative scalp soft tissues. Normal
visualized bone marrow signal. Negative for age visualized cervical
spine. Postoperative changes to the right mandible R evident.
IMPRESSION: Minimal chronic small vessel ischemia in the left cerebellar
hemisphere, otherwise normal for age noncontrast MRI appearance of
the brain.

## 2016-12-13 ENCOUNTER — Encounter: Payer: Self-pay | Admitting: Family Medicine

## 2016-12-13 ENCOUNTER — Ambulatory Visit (INDEPENDENT_AMBULATORY_CARE_PROVIDER_SITE_OTHER): Payer: Medicare Other | Admitting: Family Medicine

## 2016-12-13 VITALS — BP 105/75 | HR 88 | Temp 98.4°F | Wt 212.0 lb

## 2016-12-13 DIAGNOSIS — E039 Hypothyroidism, unspecified: Secondary | ICD-10-CM

## 2016-12-13 DIAGNOSIS — D510 Vitamin B12 deficiency anemia due to intrinsic factor deficiency: Secondary | ICD-10-CM | POA: Diagnosis not present

## 2016-12-13 DIAGNOSIS — I10 Essential (primary) hypertension: Secondary | ICD-10-CM | POA: Diagnosis not present

## 2016-12-13 DIAGNOSIS — M9903 Segmental and somatic dysfunction of lumbar region: Secondary | ICD-10-CM | POA: Diagnosis not present

## 2016-12-13 DIAGNOSIS — M5136 Other intervertebral disc degeneration, lumbar region: Secondary | ICD-10-CM | POA: Diagnosis not present

## 2016-12-13 DIAGNOSIS — M9905 Segmental and somatic dysfunction of pelvic region: Secondary | ICD-10-CM | POA: Diagnosis not present

## 2016-12-13 DIAGNOSIS — M5432 Sciatica, left side: Secondary | ICD-10-CM | POA: Diagnosis not present

## 2016-12-13 NOTE — Assessment & Plan Note (Signed)
The current medical regimen is effective;  continue present plan and medications.  

## 2016-12-13 NOTE — Progress Notes (Signed)
   BP 105/75 (BP Location: Left Arm, Patient Position: Sitting, Cuff Size: Normal)   Pulse 88   Temp 98.4 F (36.9 C) (Temporal)   Wt 212 lb (96.2 kg)   SpO2 95%   BMI 34.22 kg/m    Subjective:    Patient ID: Joshua [name], male    DOB: August 07, 1950, 66 y.o.   MRN: [id number]  HPI: Joshua [name] is a 66 y.o. male  Chief Complaint  Patient presents with  . Follow-up  . Hypertension  patient all in all doing well taking blood pressure medications without problems or issues good control No low blood pressure spells or problems with medications. Taking thyroid also faithfully without problems or issues. Taking faithfully without problems. Also taking B12 one a day without problems.  Relevant past medical, surgical, family and social history reviewed and updated as indicated. Interim medical history since our last visit reviewed. Allergies and medications reviewed and updated.  Review of Systems  Constitutional: Negative.   Respiratory: Negative.   Cardiovascular: Negative.     Per HPI unless specifically indicated above     Objective:    BP 105/75 (BP Location: Left Arm, Patient Position: Sitting, Cuff Size: Normal)   Pulse 88   Temp 98.4 F (36.9 C) (Temporal)   Wt 212 lb (96.2 kg)   SpO2 95%   BMI 34.22 kg/m   Wt Readings from Last 3 Encounters:  12/13/16 212 lb (96.2 kg)  07/30/16 215 lb 3.2 oz (97.6 kg)  06/10/16 227 lb (103 kg)    Physical Exam  Constitutional: He is oriented to person, place, and time. He appears well-developed and well-nourished.  HENT:  Head: Normocephalic and atraumatic.  Eyes: Conjunctivae and EOM are normal.  Neck: Normal range of motion.  Cardiovascular: Normal rate, regular rhythm and normal heart sounds.  Pulmonary/Chest: Effort normal and breath sounds normal.  Musculoskeletal: Normal range of motion.  Neurological: He is alert and oriented to person, place, and time.  Skin: No erythema.  Psychiatric: He has a normal mood  and affect. His behavior is normal. Judgment and thought content normal.        Assessment & Plan:   Problem List Items Addressed This Visit      Cardiovascular and Mediastinum   Benign essential HTN - Primary    The current medical regimen is effective;  continue present plan and medications.       Relevant Orders   Basic metabolic panel     Endocrine   Hypothyroidism    The current medical regimen is effective;  continue present plan and medications.         Other   Pernicious anemia    The current medical regimen is effective;  continue present plan and medications.           Follow up plan: Return in about 6 months (around 06/12/2017) for Physical Exam.

## 2016-12-14 ENCOUNTER — Telehealth: Payer: Self-pay | Admitting: Family Medicine

## 2016-12-14 DIAGNOSIS — R7309 Other abnormal glucose: Secondary | ICD-10-CM

## 2016-12-14 LAB — BASIC METABOLIC PANEL
BUN/Creatinine Ratio: 16 (ref 10–24)
BUN: 19 mg/dL (ref 8–27)
CALCIUM: 9.6 mg/dL (ref 8.6–10.2)
CHLORIDE: 98 mmol/L (ref 96–106)
CO2: 25 mmol/L (ref 20–29)
Creatinine, Ser: 1.21 mg/dL (ref 0.76–1.27)
GFR calc Af Amer: 71 mL/min/{1.73_m2} (ref >59)
GFR calc non Af Amer: 61 mL/min/{1.73_m2} (ref >59)
GLUCOSE: 160 mg/dL — AB (ref 65–99)
Potassium: 4.3 mmol/L (ref 3.5–5.2)
Sodium: 139 mmol/L (ref 134–144)

## 2016-12-14 NOTE — Telephone Encounter (Signed)
Phone call Discussed with patient elevated glucose Will check hemoglobin A1c patient will come by to check bloodwork.

## 2016-12-15 DIAGNOSIS — M9903 Segmental and somatic dysfunction of lumbar region: Secondary | ICD-10-CM | POA: Diagnosis not present

## 2016-12-15 DIAGNOSIS — M5432 Sciatica, left side: Secondary | ICD-10-CM | POA: Diagnosis not present

## 2016-12-15 DIAGNOSIS — M5136 Other intervertebral disc degeneration, lumbar region: Secondary | ICD-10-CM | POA: Diagnosis not present

## 2016-12-15 DIAGNOSIS — M9905 Segmental and somatic dysfunction of pelvic region: Secondary | ICD-10-CM | POA: Diagnosis not present

## 2016-12-17 DIAGNOSIS — M9903 Segmental and somatic dysfunction of lumbar region: Secondary | ICD-10-CM | POA: Diagnosis not present

## 2016-12-17 DIAGNOSIS — M5432 Sciatica, left side: Secondary | ICD-10-CM | POA: Diagnosis not present

## 2016-12-17 DIAGNOSIS — M5136 Other intervertebral disc degeneration, lumbar region: Secondary | ICD-10-CM | POA: Diagnosis not present

## 2016-12-17 DIAGNOSIS — M9905 Segmental and somatic dysfunction of pelvic region: Secondary | ICD-10-CM | POA: Diagnosis not present

## 2016-12-20 DIAGNOSIS — M9903 Segmental and somatic dysfunction of lumbar region: Secondary | ICD-10-CM | POA: Diagnosis not present

## 2016-12-20 DIAGNOSIS — M5432 Sciatica, left side: Secondary | ICD-10-CM | POA: Diagnosis not present

## 2016-12-20 DIAGNOSIS — M5136 Other intervertebral disc degeneration, lumbar region: Secondary | ICD-10-CM | POA: Diagnosis not present

## 2016-12-20 DIAGNOSIS — M9905 Segmental and somatic dysfunction of pelvic region: Secondary | ICD-10-CM | POA: Diagnosis not present

## 2016-12-22 DIAGNOSIS — M5136 Other intervertebral disc degeneration, lumbar region: Secondary | ICD-10-CM | POA: Diagnosis not present

## 2016-12-22 DIAGNOSIS — M9903 Segmental and somatic dysfunction of lumbar region: Secondary | ICD-10-CM | POA: Diagnosis not present

## 2016-12-22 DIAGNOSIS — M9905 Segmental and somatic dysfunction of pelvic region: Secondary | ICD-10-CM | POA: Diagnosis not present

## 2016-12-22 DIAGNOSIS — M5432 Sciatica, left side: Secondary | ICD-10-CM | POA: Diagnosis not present

## 2016-12-24 ENCOUNTER — Other Ambulatory Visit: Payer: Medicare Other

## 2016-12-24 DIAGNOSIS — R7309 Other abnormal glucose: Secondary | ICD-10-CM

## 2016-12-24 LAB — BAYER DCA HB A1C WAIVED: HB A1C (BAYER DCA - WAIVED): 5.9 % (ref <7.0)

## 2016-12-27 DIAGNOSIS — M9903 Segmental and somatic dysfunction of lumbar region: Secondary | ICD-10-CM | POA: Diagnosis not present

## 2016-12-27 DIAGNOSIS — M9905 Segmental and somatic dysfunction of pelvic region: Secondary | ICD-10-CM | POA: Diagnosis not present

## 2016-12-27 DIAGNOSIS — M5136 Other intervertebral disc degeneration, lumbar region: Secondary | ICD-10-CM | POA: Diagnosis not present

## 2016-12-27 DIAGNOSIS — M5432 Sciatica, left side: Secondary | ICD-10-CM | POA: Diagnosis not present

## 2016-12-29 DIAGNOSIS — M9905 Segmental and somatic dysfunction of pelvic region: Secondary | ICD-10-CM | POA: Diagnosis not present

## 2016-12-29 DIAGNOSIS — M5136 Other intervertebral disc degeneration, lumbar region: Secondary | ICD-10-CM | POA: Diagnosis not present

## 2016-12-29 DIAGNOSIS — M5432 Sciatica, left side: Secondary | ICD-10-CM | POA: Diagnosis not present

## 2016-12-29 DIAGNOSIS — M9903 Segmental and somatic dysfunction of lumbar region: Secondary | ICD-10-CM | POA: Diagnosis not present

## 2017-01-04 DIAGNOSIS — M9905 Segmental and somatic dysfunction of pelvic region: Secondary | ICD-10-CM | POA: Diagnosis not present

## 2017-01-04 DIAGNOSIS — M5136 Other intervertebral disc degeneration, lumbar region: Secondary | ICD-10-CM | POA: Diagnosis not present

## 2017-01-04 DIAGNOSIS — M5432 Sciatica, left side: Secondary | ICD-10-CM | POA: Diagnosis not present

## 2017-01-04 DIAGNOSIS — M9903 Segmental and somatic dysfunction of lumbar region: Secondary | ICD-10-CM | POA: Diagnosis not present

## 2017-02-07 DIAGNOSIS — M79675 Pain in left toe(s): Secondary | ICD-10-CM | POA: Diagnosis not present

## 2017-02-07 DIAGNOSIS — L600 Ingrowing nail: Secondary | ICD-10-CM | POA: Diagnosis not present

## 2017-02-07 DIAGNOSIS — M79674 Pain in right toe(s): Secondary | ICD-10-CM | POA: Diagnosis not present

## 2017-02-14 DIAGNOSIS — M5136 Other intervertebral disc degeneration, lumbar region: Secondary | ICD-10-CM | POA: Diagnosis not present

## 2017-02-14 DIAGNOSIS — M5432 Sciatica, left side: Secondary | ICD-10-CM | POA: Diagnosis not present

## 2017-02-14 DIAGNOSIS — M9903 Segmental and somatic dysfunction of lumbar region: Secondary | ICD-10-CM | POA: Diagnosis not present

## 2017-02-14 DIAGNOSIS — M9905 Segmental and somatic dysfunction of pelvic region: Secondary | ICD-10-CM | POA: Diagnosis not present

## 2017-02-16 DIAGNOSIS — M5432 Sciatica, left side: Secondary | ICD-10-CM | POA: Diagnosis not present

## 2017-02-16 DIAGNOSIS — M9905 Segmental and somatic dysfunction of pelvic region: Secondary | ICD-10-CM | POA: Diagnosis not present

## 2017-02-16 DIAGNOSIS — M9903 Segmental and somatic dysfunction of lumbar region: Secondary | ICD-10-CM | POA: Diagnosis not present

## 2017-02-16 DIAGNOSIS — M5136 Other intervertebral disc degeneration, lumbar region: Secondary | ICD-10-CM | POA: Diagnosis not present

## 2017-02-18 DIAGNOSIS — M9905 Segmental and somatic dysfunction of pelvic region: Secondary | ICD-10-CM | POA: Diagnosis not present

## 2017-02-18 DIAGNOSIS — M5136 Other intervertebral disc degeneration, lumbar region: Secondary | ICD-10-CM | POA: Diagnosis not present

## 2017-02-18 DIAGNOSIS — M5432 Sciatica, left side: Secondary | ICD-10-CM | POA: Diagnosis not present

## 2017-02-18 DIAGNOSIS — M9903 Segmental and somatic dysfunction of lumbar region: Secondary | ICD-10-CM | POA: Diagnosis not present

## 2017-02-21 DIAGNOSIS — M5136 Other intervertebral disc degeneration, lumbar region: Secondary | ICD-10-CM | POA: Diagnosis not present

## 2017-02-21 DIAGNOSIS — M5432 Sciatica, left side: Secondary | ICD-10-CM | POA: Diagnosis not present

## 2017-02-21 DIAGNOSIS — M9903 Segmental and somatic dysfunction of lumbar region: Secondary | ICD-10-CM | POA: Diagnosis not present

## 2017-02-21 DIAGNOSIS — L600 Ingrowing nail: Secondary | ICD-10-CM | POA: Diagnosis not present

## 2017-02-21 DIAGNOSIS — M79675 Pain in left toe(s): Secondary | ICD-10-CM | POA: Diagnosis not present

## 2017-02-21 DIAGNOSIS — M79674 Pain in right toe(s): Secondary | ICD-10-CM | POA: Diagnosis not present

## 2017-02-21 DIAGNOSIS — M9905 Segmental and somatic dysfunction of pelvic region: Secondary | ICD-10-CM | POA: Diagnosis not present

## 2017-02-23 DIAGNOSIS — M9905 Segmental and somatic dysfunction of pelvic region: Secondary | ICD-10-CM | POA: Diagnosis not present

## 2017-02-23 DIAGNOSIS — M5136 Other intervertebral disc degeneration, lumbar region: Secondary | ICD-10-CM | POA: Diagnosis not present

## 2017-02-23 DIAGNOSIS — M5432 Sciatica, left side: Secondary | ICD-10-CM | POA: Diagnosis not present

## 2017-02-23 DIAGNOSIS — M9903 Segmental and somatic dysfunction of lumbar region: Secondary | ICD-10-CM | POA: Diagnosis not present

## 2017-03-01 DIAGNOSIS — M9905 Segmental and somatic dysfunction of pelvic region: Secondary | ICD-10-CM | POA: Diagnosis not present

## 2017-03-01 DIAGNOSIS — M5136 Other intervertebral disc degeneration, lumbar region: Secondary | ICD-10-CM | POA: Diagnosis not present

## 2017-03-01 DIAGNOSIS — M9903 Segmental and somatic dysfunction of lumbar region: Secondary | ICD-10-CM | POA: Diagnosis not present

## 2017-03-01 DIAGNOSIS — M5432 Sciatica, left side: Secondary | ICD-10-CM | POA: Diagnosis not present

## 2017-03-14 DIAGNOSIS — M79674 Pain in right toe(s): Secondary | ICD-10-CM | POA: Diagnosis not present

## 2017-03-14 DIAGNOSIS — L600 Ingrowing nail: Secondary | ICD-10-CM | POA: Diagnosis not present

## 2017-03-14 DIAGNOSIS — M79675 Pain in left toe(s): Secondary | ICD-10-CM | POA: Diagnosis not present

## 2017-03-15 DIAGNOSIS — M5136 Other intervertebral disc degeneration, lumbar region: Secondary | ICD-10-CM | POA: Diagnosis not present

## 2017-03-15 DIAGNOSIS — M9905 Segmental and somatic dysfunction of pelvic region: Secondary | ICD-10-CM | POA: Diagnosis not present

## 2017-03-15 DIAGNOSIS — M5432 Sciatica, left side: Secondary | ICD-10-CM | POA: Diagnosis not present

## 2017-03-15 DIAGNOSIS — M9903 Segmental and somatic dysfunction of lumbar region: Secondary | ICD-10-CM | POA: Diagnosis not present

## 2017-03-16 DIAGNOSIS — M9905 Segmental and somatic dysfunction of pelvic region: Secondary | ICD-10-CM | POA: Diagnosis not present

## 2017-03-16 DIAGNOSIS — M9903 Segmental and somatic dysfunction of lumbar region: Secondary | ICD-10-CM | POA: Diagnosis not present

## 2017-03-16 DIAGNOSIS — M5136 Other intervertebral disc degeneration, lumbar region: Secondary | ICD-10-CM | POA: Diagnosis not present

## 2017-03-16 DIAGNOSIS — M5432 Sciatica, left side: Secondary | ICD-10-CM | POA: Diagnosis not present

## 2017-03-18 DIAGNOSIS — M5432 Sciatica, left side: Secondary | ICD-10-CM | POA: Diagnosis not present

## 2017-03-18 DIAGNOSIS — M9903 Segmental and somatic dysfunction of lumbar region: Secondary | ICD-10-CM | POA: Diagnosis not present

## 2017-03-18 DIAGNOSIS — M9905 Segmental and somatic dysfunction of pelvic region: Secondary | ICD-10-CM | POA: Diagnosis not present

## 2017-03-18 DIAGNOSIS — M5136 Other intervertebral disc degeneration, lumbar region: Secondary | ICD-10-CM | POA: Diagnosis not present

## 2017-03-22 ENCOUNTER — Encounter: Payer: Self-pay | Admitting: Family Medicine

## 2017-03-22 DIAGNOSIS — M9903 Segmental and somatic dysfunction of lumbar region: Secondary | ICD-10-CM | POA: Diagnosis not present

## 2017-03-22 DIAGNOSIS — M5136 Other intervertebral disc degeneration, lumbar region: Secondary | ICD-10-CM | POA: Diagnosis not present

## 2017-03-22 DIAGNOSIS — M5432 Sciatica, left side: Secondary | ICD-10-CM | POA: Diagnosis not present

## 2017-03-22 DIAGNOSIS — M9905 Segmental and somatic dysfunction of pelvic region: Secondary | ICD-10-CM | POA: Diagnosis not present

## 2017-03-29 DIAGNOSIS — M5432 Sciatica, left side: Secondary | ICD-10-CM | POA: Diagnosis not present

## 2017-03-29 DIAGNOSIS — M9903 Segmental and somatic dysfunction of lumbar region: Secondary | ICD-10-CM | POA: Diagnosis not present

## 2017-03-29 DIAGNOSIS — M9905 Segmental and somatic dysfunction of pelvic region: Secondary | ICD-10-CM | POA: Diagnosis not present

## 2017-03-29 DIAGNOSIS — M5136 Other intervertebral disc degeneration, lumbar region: Secondary | ICD-10-CM | POA: Diagnosis not present

## 2017-04-15 DIAGNOSIS — M9905 Segmental and somatic dysfunction of pelvic region: Secondary | ICD-10-CM | POA: Diagnosis not present

## 2017-04-15 DIAGNOSIS — M5432 Sciatica, left side: Secondary | ICD-10-CM | POA: Diagnosis not present

## 2017-04-15 DIAGNOSIS — M5136 Other intervertebral disc degeneration, lumbar region: Secondary | ICD-10-CM | POA: Diagnosis not present

## 2017-04-15 DIAGNOSIS — M9903 Segmental and somatic dysfunction of lumbar region: Secondary | ICD-10-CM | POA: Diagnosis not present

## 2017-05-03 DIAGNOSIS — M9905 Segmental and somatic dysfunction of pelvic region: Secondary | ICD-10-CM | POA: Diagnosis not present

## 2017-05-03 DIAGNOSIS — M5136 Other intervertebral disc degeneration, lumbar region: Secondary | ICD-10-CM | POA: Diagnosis not present

## 2017-05-03 DIAGNOSIS — M5432 Sciatica, left side: Secondary | ICD-10-CM | POA: Diagnosis not present

## 2017-05-03 DIAGNOSIS — M9903 Segmental and somatic dysfunction of lumbar region: Secondary | ICD-10-CM | POA: Diagnosis not present

## 2017-05-06 DIAGNOSIS — M5136 Other intervertebral disc degeneration, lumbar region: Secondary | ICD-10-CM | POA: Diagnosis not present

## 2017-05-06 DIAGNOSIS — M9903 Segmental and somatic dysfunction of lumbar region: Secondary | ICD-10-CM | POA: Diagnosis not present

## 2017-05-06 DIAGNOSIS — M5432 Sciatica, left side: Secondary | ICD-10-CM | POA: Diagnosis not present

## 2017-05-06 DIAGNOSIS — M9905 Segmental and somatic dysfunction of pelvic region: Secondary | ICD-10-CM | POA: Diagnosis not present

## 2017-05-17 DIAGNOSIS — M9903 Segmental and somatic dysfunction of lumbar region: Secondary | ICD-10-CM | POA: Diagnosis not present

## 2017-05-17 DIAGNOSIS — M5136 Other intervertebral disc degeneration, lumbar region: Secondary | ICD-10-CM | POA: Diagnosis not present

## 2017-05-17 DIAGNOSIS — M9905 Segmental and somatic dysfunction of pelvic region: Secondary | ICD-10-CM | POA: Diagnosis not present

## 2017-05-17 DIAGNOSIS — M5432 Sciatica, left side: Secondary | ICD-10-CM | POA: Diagnosis not present

## 2017-06-07 DIAGNOSIS — M9903 Segmental and somatic dysfunction of lumbar region: Secondary | ICD-10-CM | POA: Diagnosis not present

## 2017-06-07 DIAGNOSIS — M9905 Segmental and somatic dysfunction of pelvic region: Secondary | ICD-10-CM | POA: Diagnosis not present

## 2017-06-07 DIAGNOSIS — M5432 Sciatica, left side: Secondary | ICD-10-CM | POA: Diagnosis not present

## 2017-06-07 DIAGNOSIS — M5136 Other intervertebral disc degeneration, lumbar region: Secondary | ICD-10-CM | POA: Diagnosis not present

## 2017-06-08 DIAGNOSIS — M5432 Sciatica, left side: Secondary | ICD-10-CM | POA: Diagnosis not present

## 2017-06-08 DIAGNOSIS — M9905 Segmental and somatic dysfunction of pelvic region: Secondary | ICD-10-CM | POA: Diagnosis not present

## 2017-06-08 DIAGNOSIS — M9903 Segmental and somatic dysfunction of lumbar region: Secondary | ICD-10-CM | POA: Diagnosis not present

## 2017-06-08 DIAGNOSIS — M5136 Other intervertebral disc degeneration, lumbar region: Secondary | ICD-10-CM | POA: Diagnosis not present

## 2017-06-13 DIAGNOSIS — M5136 Other intervertebral disc degeneration, lumbar region: Secondary | ICD-10-CM | POA: Diagnosis not present

## 2017-06-13 DIAGNOSIS — M5432 Sciatica, left side: Secondary | ICD-10-CM | POA: Diagnosis not present

## 2017-06-13 DIAGNOSIS — M9903 Segmental and somatic dysfunction of lumbar region: Secondary | ICD-10-CM | POA: Diagnosis not present

## 2017-06-13 DIAGNOSIS — M9905 Segmental and somatic dysfunction of pelvic region: Secondary | ICD-10-CM | POA: Diagnosis not present

## 2017-06-14 ENCOUNTER — Encounter: Payer: Medicare Other | Admitting: Family Medicine

## 2017-06-17 DIAGNOSIS — M9903 Segmental and somatic dysfunction of lumbar region: Secondary | ICD-10-CM | POA: Diagnosis not present

## 2017-06-17 DIAGNOSIS — M5136 Other intervertebral disc degeneration, lumbar region: Secondary | ICD-10-CM | POA: Diagnosis not present

## 2017-06-17 DIAGNOSIS — M5432 Sciatica, left side: Secondary | ICD-10-CM | POA: Diagnosis not present

## 2017-06-17 DIAGNOSIS — M9905 Segmental and somatic dysfunction of pelvic region: Secondary | ICD-10-CM | POA: Diagnosis not present

## 2017-06-20 ENCOUNTER — Ambulatory Visit: Payer: Self-pay

## 2017-06-23 ENCOUNTER — Ambulatory Visit (INDEPENDENT_AMBULATORY_CARE_PROVIDER_SITE_OTHER): Payer: Medicare Other

## 2017-06-23 VITALS — BP 108/70 | HR 72 | Temp 98.7°F | Resp 16 | Ht 66.0 in | Wt 213.7 lb

## 2017-06-23 DIAGNOSIS — I10 Essential (primary) hypertension: Secondary | ICD-10-CM | POA: Diagnosis not present

## 2017-06-23 DIAGNOSIS — Z23 Encounter for immunization: Secondary | ICD-10-CM | POA: Diagnosis not present

## 2017-06-23 DIAGNOSIS — Z1159 Encounter for screening for other viral diseases: Secondary | ICD-10-CM | POA: Diagnosis not present

## 2017-06-23 DIAGNOSIS — N401 Enlarged prostate with lower urinary tract symptoms: Secondary | ICD-10-CM | POA: Diagnosis not present

## 2017-06-23 DIAGNOSIS — E039 Hypothyroidism, unspecified: Secondary | ICD-10-CM | POA: Diagnosis not present

## 2017-06-23 DIAGNOSIS — Z0000 Encounter for general adult medical examination without abnormal findings: Secondary | ICD-10-CM

## 2017-06-23 DIAGNOSIS — N138 Other obstructive and reflux uropathy: Secondary | ICD-10-CM | POA: Diagnosis not present

## 2017-06-23 DIAGNOSIS — Z1329 Encounter for screening for other suspected endocrine disorder: Secondary | ICD-10-CM

## 2017-06-23 NOTE — Progress Notes (Signed)
Subjective:   Joshua [name] is a 67 y.o. male who presents for Medicare Annual/Subsequent preventive examination.  Review of Systems:   Cardiac Risk Factors include: hypertension;male gender;advanced age (>10men, >54 women);obesity (BMI >30kg/m2)     Objective:    Vitals: BP 108/70 (BP Location: Left Arm, Patient Position: Sitting)   Pulse 72   Temp 98.7 F (37.1 C) (Temporal)   Resp 16   Ht 5\' 6"  (1.676 m)   Wt 213 lb 11.2 oz (96.9 kg)   BMI 34.49 kg/m   Body mass index is 34.49 kg/m.  Advanced Directives 06/23/2017  Does Patient Have a Medical Advance Directive? No  Does patient want to make changes to medical advance directive? Yes (MAU/Ambulatory/Procedural Areas - Information given)    Tobacco Social History   Tobacco Use  Smoking Status Never Smoker  Smokeless Tobacco Never Used     Counseling given: Not Answered   Clinical Intake:  Pre-visit preparation completed: Yes  Pain : 0-10 Pain Score: 2  Pain Type: Chronic pain Pain Location: Shoulder Pain Orientation: Left Pain Descriptors / Indicators: Aching Pain Onset: More than a month ago Pain Frequency: Intermittent     Nutritional Status: BMI > 30  Obese Nutritional Risks: None Diabetes: No  How often do you need to have someone help you when you read instructions, pamphlets, or other written materials from your doctor or pharmacy?: 1 - Never What is the last grade level you completed in school?: 12th grade     Information entered by :: Tiffany Hill,LPN   Past Medical History:  Diagnosis Date  . Allergy   . Amnesia 06/13/2014  . B12 deficiency 06/13/2014  . Benign essential HTN 10/30/2014  . BPH (benign prostatic hyperplasia)   . BPH with obstruction/lower urinary tract symptoms 05/15/2015  . Hypogonadism in male   . Hypothyroidism 10/30/2014   Past Surgical History:  Procedure Laterality Date  . COLONOSCOPY  March 2016  . ESOPHAGOGASTRODUODENOSCOPY ENDOSCOPY  March 2016  . EYE  SURGERY Bilateral    cataracts  . KNEE SURGERY Right   . SHOULDER SURGERY Left   . TONSILLECTOMY     Family History  Problem Relation Age of Onset  . Breast cancer Mother   . COPD Father   . Heart disease Father   . Prostate cancer Father    Social History   Socioeconomic History  . Marital status: Married    Spouse name: Not on file  . Number of children: Not on file  . Years of education: Not on file  . Highest education level: Not on file  Occupational History  . Not on file  Social Needs  . Financial resource strain: Not hard at all  . Food insecurity:    Worry: Never true    Inability: Never true  . Transportation needs:    Medical: No    Non-medical: No  Tobacco Use  . Smoking status: Never Smoker  . Smokeless tobacco: Never Used  Substance and Sexual Activity  . Alcohol use: Yes    Alcohol/week: 0.0 oz    Comment: Rarely  . Drug use: No  . Sexual activity: Not on file  Lifestyle  . Physical activity:    Days per week: 0 days    Minutes per session: 0 min  . Stress: Not at all  Relationships  . Social connections:    Talks on phone: More than three times a week    Gets together: More than three times a  week    Attends religious service: Never    Active member of club or organization: No    Attends meetings of clubs or organizations: Never    Relationship status: Married  Other Topics Concern  . Not on file  Social History Narrative  . Not on file    Outpatient Encounter Medications as of 06/23/2017  Medication Sig  . Cyanocobalamin (RA VITAMIN B-12 TR) 1000 MCG TBCR Take 1,000 mcg by mouth daily.   Marland Kitchen levothyroxine (SYNTHROID, LEVOTHROID) 137 MCG tablet Take 1 tablet (137 mcg total) by mouth daily before breakfast.  . losartan-hydrochlorothiazide (HYZAAR) 100-25 MG tablet Take 1 tablet by mouth daily.  Marland Kitchen aspirin EC 81 MG tablet Take 81 mg by mouth daily.  Marland Kitchen azelastine (ASTELIN) 0.1 % nasal spray Place 1 spray into both nostrils 2 (two) times daily.  Use in each nostril as directed (Patient not taking: Reported on 06/23/2017)   No facility-administered encounter medications on file as of 06/23/2017.     Activities of Daily Living In your present state of health, do you have any difficulty performing the following activities: 06/23/2017  Hearing? N  Vision? N  Difficulty concentrating or making decisions? Y  Walking or climbing stairs? N  Dressing or bathing? N  Doing errands, shopping? N  Preparing Food and eating ? N  Using the Toilet? N  In the past six months, have you accidently leaked urine? N  Do you have problems with loss of bowel control? N  Managing your Medications? N  Managing your Finances? N  Housekeeping or managing your Housekeeping? N  Some recent data might be hidden    Patient Care Team: Guadalupe Maple, MD as PCP - General (Family Medicine) Manya Silvas, MD (Gastroenterology) Dawayne Patricia, MD as Referring Physician (Orthopedic Surgery) Carloyn Manner, MD as Referring Physician (Otolaryngology)   Assessment:   This is a routine wellness examination for Joshua [name].  Exercise Activities and Dietary recommendations Current Exercise Habits: The patient does not participate in regular exercise at present, Exercise limited by: None identified  Goals    . DIET - INCREASE WATER INTAKE     Recommend drinking at least 6-8 glasses of water a day        Fall Risk Fall Risk  06/23/2017 06/10/2016 03/31/2015  Falls in the past year? No No No   Is the patient's home free of loose throw rugs in walkways, pet beds, electrical cords, etc?   yes      Grab bars in the bathroom? yes      Handrails on the stairs?   No stairs       Adequate lighting?   yes  Timed Get Up and Go Performed: Completed in 8 seconds with no use of assistive devices, steady gait. No intervention needed at this time.   Depression Screen PHQ 2/9 Scores 06/23/2017 06/10/2016 03/31/2015  PHQ - 2 Score 0 0 0    Cognitive Function       6CIT Screen 06/23/2017  What Year? 0 points  What month? 0 points  What time? 0 points  Count back from 20 0 points  Months in reverse 0 points  Repeat phrase 0 points  Total Score 0    Immunization History  Administered Date(s) Administered  . Influenza, High Dose Seasonal PF 10/30/2014, 11/06/2015, 11/08/2016  . Influenza,inj,Quad PF,6+ Mos 10/30/2014  . Pneumococcal Conjugate-13 10/29/2013  . Pneumococcal Polysaccharide-23 06/23/2017  . Td 10/11/2007  . Zoster 02/24/2011    Qualifies for  Shingles Vaccine?  Yes, discussed shingrix vaccine   Screening Tests Health Maintenance  Topic Date Due  . Hepatitis C Screening  Jun 02, 1950  . PNA vac Low Risk Adult (2 of 2 - PPSV23) 10/30/2014  . INFLUENZA VACCINE  09/01/2017  . TETANUS/TDAP  10/10/2017  . COLONOSCOPY  04/16/2024   Cancer Screenings: Lung: Low Dose CT Chest recommended if Age 42-80 years, 30 pack-year currently smoking OR have quit w/in 15years. Patient does not qualify. Colorectal: completed 04/17/2014  Additional Screenings: Hepatitis C Screening:done today       Plan:    I have personally reviewed and addressed the Medicare Annual Wellness questionnaire and have noted the following in the patient's chart:  A. Medical and social history B. Use of alcohol, tobacco or illicit drugs  C. Current medications and supplements D. Functional ability and status E.  Nutritional status F.  Physical activity G. Advance directives H. List of other physicians I.  Hospitalizations, surgeries, and ER visits in previous 12 months J.  [place] such as hearing and vision if needed, cognitive and depression L. Referrals and appointments   In addition, I have reviewed and discussed with patient certain preventive protocols, quality metrics, and best practice recommendations. A written personalized care plan for preventive services as well as general preventive health recommendations were provided to  patient.   Signed,  Tyler Aas, LPN Nurse Health Advisor   Nurse Notes:none

## 2017-06-23 NOTE — Patient Instructions (Signed)
[name] , Thank you for taking time to come for your Medicare Wellness Visit. I appreciate your ongoing commitment to your health goals. Please review the following plan we discussed and let me know if I can assist you in the future.   Screening recommendations/referrals: Colonoscopy: up to date Recommended yearly ophthalmology/optometry visit for glaucoma screening and checkup Recommended yearly dental visit for hygiene and checkup  Vaccinations: Influenza vaccine: up to date, due 10/2017 Pneumococcal vaccine: completed series Tdap vaccine: up to date Shingles vaccine: shingrix eligible, check with your insurance company for coverage    Advanced directives: Please bring a copy of your health care power of attorney and living will to the office at your convenience.  Conditions/risks identified: Recommend drinking at least 6-8 glasses of water a day   Next appointment: Follow up on 06/28/2017 at 2pm with Dr.Crissman. Follow up in one year for your annual wellness exam.   Preventive Care 65 Years and Older, Male Preventive care refers to lifestyle choices and visits with your health care provider that can promote health and wellness. What does preventive care include?  A yearly physical exam. This is also called an annual well check.  Dental exams once or twice a year.  Routine eye exams. Ask your health care provider how often you should have your eyes checked.  Personal lifestyle choices, including:  Daily care of your teeth and gums.  Regular physical activity.  Eating a healthy diet.  Avoiding tobacco and drug use.  Limiting alcohol use.  Practicing safe sex.  Taking low doses of aspirin every day.  Taking vitamin and mineral supplements as recommended by your health care provider. What happens during an annual well check? The services and screenings done by your health care provider during your annual well check will depend on your age, overall health, lifestyle  risk factors, and family history of disease. Counseling  Your health care provider may ask you questions about your:  Alcohol use.  Tobacco use.  Drug use.  Emotional well-being.  Home and relationship well-being.  Sexual activity.  Eating habits.  History of falls.  Memory and ability to understand (cognition).  Work and work Statistician. Screening  You may have the following tests or measurements:  Height, weight, and BMI.  Blood pressure.  Lipid and cholesterol levels. These may be checked every 5 years, or more frequently if you are over 57 years old.  Skin check.  Lung cancer screening. You may have this screening every year starting at age 69 if you have a 30-pack-year history of smoking and currently smoke or have quit within the past 15 years.  Fecal occult blood test (FOBT) of the stool. You may have this test every year starting at age 43.  Flexible sigmoidoscopy or colonoscopy. You may have a sigmoidoscopy every 5 years or a colonoscopy every 10 years starting at age 19.  Prostate cancer screening. Recommendations will vary depending on your family history and other risks.  Hepatitis C blood test.  Hepatitis B blood test.  Sexually transmitted disease (STD) testing.  Diabetes screening. This is done by checking your blood sugar (glucose) after you have not eaten for a while (fasting). You may have this done every 1-3 years.  Abdominal aortic aneurysm (AAA) screening. You may need this if you are a current or former smoker.  Osteoporosis. You may be screened starting at age 60 if you are at high risk. Talk with your health care provider about your test results, treatment options,  and if necessary, the need for more tests. Vaccines  Your health care provider may recommend certain vaccines, such as:  Influenza vaccine. This is recommended every year.  Tetanus, diphtheria, and acellular pertussis (Tdap, Td) vaccine. You may need a Td booster every 10  years.  Zoster vaccine. You may need this after age 14.  Pneumococcal 13-valent conjugate (PCV13) vaccine. One dose is recommended after age 1.  Pneumococcal polysaccharide (PPSV23) vaccine. One dose is recommended after age 46. Talk to your health care provider about which screenings and vaccines you need and how often you need them. This information is not intended to replace advice given to you by your health care provider. Make sure you discuss any questions you have with your health care provider. Document Released: 02/14/2015 Document Revised: 10/08/2015 Document Reviewed: 11/19/2014 Elsevier Interactive Patient Education  2017 [place] Prevention in the Home Falls can cause injuries. They can happen to people of all ages. There are many things you can do to make your home safe and to help prevent falls. What can I do on the outside of my home?  Regularly fix the edges of walkways and driveways and fix any cracks.  Remove anything that might make you trip as you walk through a door, such as a raised step or threshold.  Trim any bushes or trees on the path to your home.  Use bright outdoor lighting.  Clear any walking paths of anything that might make someone trip, such as rocks or tools.  Regularly check to see if handrails are loose or broken. Make sure that both sides of any steps have handrails.  Any raised decks and porches should have guardrails on the edges.  Have any leaves, snow, or ice cleared regularly.  Use sand or salt on walking paths during winter.  Clean up any spills in your garage right away. This includes oil or grease spills. What can I do in the bathroom?  Use night lights.  Install grab bars by the toilet and in the tub and shower. Do not use towel bars as grab bars.  Use non-skid mats or decals in the tub or shower.  If you need to sit down in the shower, use a plastic, non-slip stool.  Keep the floor dry. Clean up any water that  spills on the floor as soon as it happens.  Remove soap buildup in the tub or shower regularly.  Attach bath mats securely with double-sided non-slip rug tape.  Do not have throw rugs and other things on the floor that can make you trip. What can I do in the bedroom?  Use night lights.  Make sure that you have a light by your bed that is easy to reach.  Do not use any sheets or blankets that are too big for your bed. They should not hang down onto the floor.  Have a firm chair that has side arms. You can use this for support while you get dressed.  Do not have throw rugs and other things on the floor that can make you trip. What can I do in the kitchen?  Clean up any spills right away.  Avoid walking on wet floors.  Keep items that you use a lot in easy-to-reach places.  If you need to reach something above you, use a strong step stool that has a grab bar.  Keep electrical cords out of the way.  Do not use floor polish or wax that makes floors slippery. If  you must use wax, use non-skid floor wax.  Do not have throw rugs and other things on the floor that can make you trip. What can I do with my stairs?  Do not leave any items on the stairs.  Make sure that there are handrails on both sides of the stairs and use them. Fix handrails that are broken or loose. Make sure that handrails are as long as the stairways.  Check any carpeting to make sure that it is firmly attached to the stairs. Fix any carpet that is loose or worn.  Avoid having throw rugs at the top or bottom of the stairs. If you do have throw rugs, attach them to the floor with carpet tape.  Make sure that you have a light switch at the top of the stairs and the bottom of the stairs. If you do not have them, ask someone to add them for you. What else can I do to help prevent falls?  Wear shoes that:  Do not have high heels.  Have rubber bottoms.  Are comfortable and fit you well.  Are closed at the  toe. Do not wear sandals.  If you use a stepladder:  Make sure that it is fully opened. Do not climb a closed stepladder.  Make sure that both sides of the stepladder are locked into place.  Ask someone to hold it for you, if possible.  Clearly mark and make sure that you can see:  Any grab bars or handrails.  First and last steps.  Where the edge of each step is.  Use tools that help you move around (mobility aids) if they are needed. These include:  Canes.  Walkers.  Scooters.  Crutches.  Turn on the lights when you go into a dark area. Replace any light bulbs as soon as they burn out.  Set up your furniture so you have a clear path. Avoid moving your furniture around.  If any of your floors are uneven, fix them.  If there are any pets around you, be aware of where they are.  Review your medicines with your doctor. Some medicines can make you feel dizzy. This can increase your chance of falling. Ask your doctor what other things that you can do to help prevent falls. This information is not intended to replace advice given to you by your health care provider. Make sure you discuss any questions you have with your health care provider. Document Released: 11/14/2008 Document Revised: 06/26/2015 Document Reviewed: 02/22/2014 Elsevier Interactive Patient Education  2017 [place].  Pneumococcal Polysaccharide Vaccine: What You Need to Know 1. Why get vaccinated? Vaccination can protect older adults (and some children and younger adults) from pneumococcal disease. Pneumococcal disease is caused by bacteria that can spread from person to person through close contact. It can cause ear infections, and it can also lead to more serious infections of the:  Lungs (pneumonia),  Blood (bacteremia), and  Covering of the brain and spinal cord (meningitis). Meningitis can cause deafness and brain damage, and it can be fatal.  Anyone can get pneumococcal disease, but children  under 44 years of age, people with certain medical conditions, adults over 56 years of age, and cigarette smokers are at the highest risk. About 18,000 older adults die each year from pneumococcal disease in the [place]. Treatment of pneumococcal infections with penicillin and other drugs used to be more effective. But some strains of the disease have become resistant to these drugs. This makes  prevention of the disease, through vaccination, even more important. 2. Pneumococcal polysaccharide vaccine (PPSV23) Pneumococcal polysaccharide vaccine (PPSV23) protects against 23 types of pneumococcal bacteria. It will not prevent all pneumococcal disease. PPSV23 is recommended for:  All adults 73 years of age and older,  Anyone 2 through 69 years of age with certain long-term health problems,  Anyone 2 through 69 years of age with a weakened immune system,  Adults 30 through 69 years of age who smoke cigarettes or have asthma.  Most people need only one dose of PPSV. A second dose is recommended for certain high-risk groups. People 42 and older should get a dose even if they have gotten one or more doses of the vaccine before they turned 65. Your healthcare provider can give you more information about these recommendations. Most healthy adults develop protection within 2 to 3 weeks of getting the shot. 3. Some people should not get this vaccine  Anyone who has had a life-threatening allergic reaction to PPSV should not get another dose.  Anyone who has a severe allergy to any component of PPSV should not receive it. Tell your provider if you have any severe allergies.  Anyone who is moderately or severely ill when the shot is scheduled may be asked to wait until they recover before getting the vaccine. Someone with a mild illness can usually be vaccinated.  Children less than 64 years of age should not receive this vaccine.  There is no evidence that PPSV is harmful to either a pregnant  woman or to her fetus. However, as a precaution, women who need the vaccine should be vaccinated before becoming pregnant, if possible. 4. Risks of a vaccine reaction With any medicine, including vaccines, there is a chance of side effects. These are usually mild and go away on their own, but serious reactions are also possible. About half of people who get PPSV have mild side effects, such as redness or pain where the shot is given, which go away within about two days. Less than 1 out of 100 people develop a fever, muscle aches, or more severe local reactions. Problems that could happen after any vaccine:  People sometimes faint after a medical procedure, including vaccination. Sitting or lying down for about 15 minutes can help prevent fainting, and injuries caused by a fall. Tell your doctor if you feel dizzy, or have vision changes or ringing in the ears.  Some people get severe pain in the shoulder and have difficulty moving the arm where a shot was given. This happens very rarely.  Any medication can cause a severe allergic reaction. Such reactions from a vaccine are very rare, estimated at about 1 in a million doses, and would happen within a few minutes to a few hours after the vaccination. As with any medicine, there is a very remote chance of a vaccine causing a serious injury or death. The safety of vaccines is always being monitored. For more information, visit: http://www.aguilar.org/ 5. What if there is a serious reaction? What should I look for? Look for anything that concerns you, such as signs of a severe allergic reaction, very high fever, or unusual behavior. Signs of a severe allergic reaction can include hives, swelling of the face and throat, difficulty breathing, a fast heartbeat, dizziness, and weakness. These would usually start a few minutes to a few hours after the vaccination. What should I do? If you think it is a severe allergic reaction or other emergency that  can't wait, call  9-1-1 or get to the nearest hospital. Otherwise, call your doctor. Afterward, the reaction should be reported to the Vaccine Adverse Event Reporting System (VAERS). Your doctor might file this report, or you can do it yourself through the VAERS web site at www.vaers.SamedayNews.es, or by calling (701)139-4604. VAERS does not give medical advice. 6. How can I learn more?  Ask your doctor. He or she can give you the vaccine package insert or suggest other sources of information.  Call your local or state health department.  Contact the Centers for Disease Control and Prevention (CDC): ? Call 410-114-4801 (1-800-CDC-INFO) or ? Visit CDC's website at http://hunter.com/ CDC Pneumococcal Polysaccharide Vaccine VIS (05/25/13) This information is not intended to replace advice given to you by your health care provider. Make sure you discuss any questions you have with your health care provider. Document Released: 11/15/2005 Document Revised: 10/09/2015 Document Reviewed: 10/09/2015 Elsevier Interactive Patient Education  2017 [employer].

## 2017-06-24 LAB — COMPREHENSIVE METABOLIC PANEL
ALBUMIN: 4.5 g/dL (ref 3.6–4.8)
ALT: 16 IU/L (ref 0–44)
AST: 19 IU/L (ref 0–40)
Albumin/Globulin Ratio: 1.7 (ref 1.2–2.2)
Alkaline Phosphatase: 61 IU/L (ref 39–117)
BUN / CREAT RATIO: 14 (ref 10–24)
BUN: 16 mg/dL (ref 8–27)
Bilirubin Total: 0.5 mg/dL (ref 0.0–1.2)
CALCIUM: 9.7 mg/dL (ref 8.6–10.2)
CHLORIDE: 99 mmol/L (ref 96–106)
CO2: 25 mmol/L (ref 20–29)
Creatinine, Ser: 1.18 mg/dL (ref 0.76–1.27)
GFR calc Af Amer: 72 mL/min/{1.73_m2} (ref >59)
GFR, EST NON AFRICAN AMERICAN: 63 mL/min/{1.73_m2} (ref >59)
GLOBULIN, TOTAL: 2.6 g/dL (ref 1.5–4.5)
Glucose: 87 mg/dL (ref 65–99)
Potassium: 3.7 mmol/L (ref 3.5–5.2)
Sodium: 139 mmol/L (ref 134–144)
TOTAL PROTEIN: 7.1 g/dL (ref 6.0–8.5)

## 2017-06-24 LAB — URINALYSIS, ROUTINE W REFLEX MICROSCOPIC
BILIRUBIN UA: NEGATIVE
Glucose, UA: NEGATIVE
Ketones, UA: NEGATIVE
Nitrite, UA: NEGATIVE
PH UA: 6 (ref 5.0–7.5)
PROTEIN UA: NEGATIVE
RBC UA: NEGATIVE
Specific Gravity, UA: 1.01 (ref 1.005–1.030)
Urobilinogen, Ur: 0.2 mg/dL (ref 0.2–1.0)

## 2017-06-24 LAB — CBC WITH DIFFERENTIAL/PLATELET
Basophils Absolute: 0.1 10*3/uL (ref 0.0–0.2)
Basos: 1 %
EOS (ABSOLUTE): 0.2 10*3/uL (ref 0.0–0.4)
EOS: 2 %
HEMOGLOBIN: 14.4 g/dL (ref 13.0–17.7)
Hematocrit: 42.2 % (ref 37.5–51.0)
IMMATURE GRANULOCYTES: 0 %
Immature Grans (Abs): 0 10*3/uL (ref 0.0–0.1)
Lymphocytes Absolute: 2.3 10*3/uL (ref 0.7–3.1)
Lymphs: 27 %
MCH: 32.4 pg (ref 26.6–33.0)
MCHC: 34.1 g/dL (ref 31.5–35.7)
MCV: 95 fL (ref 79–97)
MONOCYTES: 9 %
Monocytes Absolute: 0.8 10*3/uL (ref 0.1–0.9)
NEUTROS PCT: 61 %
Neutrophils Absolute: 5 10*3/uL (ref 1.4–7.0)
Platelets: 173 10*3/uL (ref 150–450)
RBC: 4.44 x10E6/uL (ref 4.14–5.80)
RDW: 13.9 % (ref 12.3–15.4)
WBC: 8.3 10*3/uL (ref 3.4–10.8)

## 2017-06-24 LAB — LIPID PANEL W/O CHOL/HDL RATIO
Cholesterol, Total: 200 mg/dL — ABNORMAL HIGH (ref 100–199)
HDL: 50 mg/dL (ref >39)
LDL Calculated: 104 mg/dL — ABNORMAL HIGH (ref 0–99)
Triglycerides: 231 mg/dL — ABNORMAL HIGH (ref 0–149)
VLDL CHOLESTEROL CAL: 46 mg/dL — AB (ref 5–40)

## 2017-06-24 LAB — HEPATITIS C ANTIBODY: Hep C Virus Ab: <0.1 s/co ratio (ref 0.0–0.9)

## 2017-06-24 LAB — MICROSCOPIC EXAMINATION
Bacteria, UA: NONE SEEN
RBC MICROSCOPIC, UA: NONE SEEN /HPF (ref 0–2)
WBC, UA: NONE SEEN /hpf (ref 0–5)

## 2017-06-24 LAB — PSA: Prostate Specific Ag, Serum: 1.8 ng/mL (ref 0.0–4.0)

## 2017-06-24 LAB — TSH: TSH: 0.889 u[IU]/mL (ref 0.450–4.500)

## 2017-06-28 ENCOUNTER — Encounter: Payer: Medicare Other | Admitting: Family Medicine

## 2017-06-29 ENCOUNTER — Telehealth: Payer: Self-pay | Admitting: Family Medicine

## 2017-06-29 DIAGNOSIS — I10 Essential (primary) hypertension: Secondary | ICD-10-CM

## 2017-06-30 NOTE — Telephone Encounter (Signed)
Refill for losartan-hydrochlorothiazide 100-25 mg, prescription expired on 06/10/17 Dr. Jeananne Rama LOV  12/13/16 [facility]  07/19/17 [facility]

## 2017-07-01 ENCOUNTER — Encounter: Payer: Self-pay | Admitting: Family Medicine

## 2017-07-01 NOTE — Telephone Encounter (Signed)
Needs seen further refills 

## 2017-07-19 ENCOUNTER — Encounter: Payer: Self-pay | Admitting: Family Medicine

## 2017-07-19 ENCOUNTER — Ambulatory Visit (INDEPENDENT_AMBULATORY_CARE_PROVIDER_SITE_OTHER): Payer: Medicare Other | Admitting: Family Medicine

## 2017-07-19 DIAGNOSIS — Z7189 Other specified counseling: Secondary | ICD-10-CM | POA: Diagnosis not present

## 2017-07-19 DIAGNOSIS — N138 Other obstructive and reflux uropathy: Secondary | ICD-10-CM

## 2017-07-19 DIAGNOSIS — E039 Hypothyroidism, unspecified: Secondary | ICD-10-CM | POA: Diagnosis not present

## 2017-07-19 DIAGNOSIS — D510 Vitamin B12 deficiency anemia due to intrinsic factor deficiency: Secondary | ICD-10-CM | POA: Diagnosis not present

## 2017-07-19 DIAGNOSIS — N401 Enlarged prostate with lower urinary tract symptoms: Secondary | ICD-10-CM | POA: Diagnosis not present

## 2017-07-19 DIAGNOSIS — I10 Essential (primary) hypertension: Secondary | ICD-10-CM | POA: Diagnosis not present

## 2017-07-19 MED ORDER — CYANOCOBALAMIN ER 1000 MCG PO TBCR: 1000.0000 ug | EXTENDED_RELEASE_TABLET | Freq: Every day | ORAL | 4 refills | Status: DC

## 2017-07-19 MED ORDER — LEVOTHYROXINE SODIUM 137 MCG PO TABS: 137.0000 ug | ORAL_TABLET | Freq: Every day | ORAL | 4 refills | Status: DC

## 2017-07-19 MED ORDER — AMLODIPINE BESYLATE 5 MG PO TABS: 5.0000 mg | ORAL_TABLET | Freq: Every day | ORAL | 3 refills | Status: DC

## 2017-07-19 NOTE — Progress Notes (Signed)
BP 104/70   Pulse 68   Ht '5\' 10"'  (1.778 m)   Wt 215 lb (97.5 kg)   SpO2 97%   BMI 30.85 kg/m    Subjective:    Patient ID: Joshua [name], male    DOB: 02-Dec-1950, 67 y.o.   MRN: [id number]  HPI: [name] is a 67 y.o. male  Chief Complaint  Patient presents with  . Annual Exam  Pt with complaints of nonspecific dry cough ongoing for about 3 months.  No other changes has not been sick or ill.  Does not feel bad with his cough.  Is mostly just aggravating. Blood pressures been doing well without problems. Thyroid medication stable without issues and taking vitamin B12 orally without problems  Relevant past medical, surgical, family and social history reviewed and updated as indicated. Interim medical history since our last visit reviewed. Allergies and medications reviewed and updated.  Review of Systems  Constitutional: Negative.   HENT: Negative.   Eyes: Negative.   Respiratory: Negative.   Cardiovascular: Negative.   Gastrointestinal: Negative.   Endocrine: Negative.   Genitourinary: Negative.   Musculoskeletal: Negative.   Skin: Negative.   Allergic/Immunologic: Negative.   Neurological: Negative.   Hematological: Negative.   Psychiatric/Behavioral: Negative.     Per HPI unless specifically indicated above     Objective:    BP 104/70   Pulse 68   Ht '5\' 10"'  (1.778 m)   Wt 215 lb (97.5 kg)   SpO2 97%   BMI 30.85 kg/m   Wt Readings from Last 3 Encounters:  07/19/17 215 lb (97.5 kg)  06/23/17 213 lb 11.2 oz (96.9 kg)  12/13/16 212 lb (96.2 kg)    Physical Exam  Constitutional: He is oriented to person, place, and time. He appears well-developed and well-nourished.  HENT:  Head: Normocephalic and atraumatic.  Right Ear: External ear normal.  Left Ear: External ear normal.  Eyes: Pupils are equal, round, and reactive to light. Conjunctivae and EOM are normal.  Neck: Normal range of motion. Neck supple.  Cardiovascular: Normal rate, regular  rhythm, normal heart sounds and intact distal pulses.  Pulmonary/Chest: Effort normal and breath sounds normal.  Abdominal: Soft. Bowel sounds are normal. There is no splenomegaly or hepatomegaly.  Genitourinary: Rectum normal, prostate normal and penis normal.  Musculoskeletal: Normal range of motion.  Neurological: He is alert and oriented to person, place, and time. He has normal reflexes.  Skin: No rash noted. No erythema.  Psychiatric: He has a normal mood and affect. His behavior is normal. Judgment and thought content normal.    Results for orders placed or performed in visit on 06/23/17  Microscopic Examination  Result Value Ref Range   WBC, UA None seen 0 - 5 /hpf   RBC, UA None seen 0 - 2 /hpf   Epithelial Cells (non renal) CANCELED    Bacteria, UA None seen None seen/Few  CBC with Differential  Result Value Ref Range   WBC 8.3 3.4 - 10.8 x10E3/uL   RBC 4.44 4.14 - 5.80 x10E6/uL   Hemoglobin 14.4 13.0 - 17.7 g/dL   Hematocrit 42.2 37.5 - 51.0 %   MCV 95 79 - 97 fL   MCH 32.4 26.6 - 33.0 pg   MCHC 34.1 31.5 - 35.7 g/dL   RDW 13.9 12.3 - 15.4 %   Platelets 173 150 - 450 x10E3/uL   Neutrophils 61 Not Estab. %   Lymphs 27 Not Estab. %   Monocytes  9 Not Estab. %   Eos 2 Not Estab. %   Basos 1 Not Estab. %   Neutrophils Absolute 5.0 1.4 - 7.0 x10E3/uL   Lymphocytes Absolute 2.3 0.7 - 3.1 x10E3/uL   Monocytes Absolute 0.8 0.1 - 0.9 x10E3/uL   EOS (ABSOLUTE) 0.2 0.0 - 0.4 x10E3/uL   Basophils Absolute 0.1 0.0 - 0.2 x10E3/uL   Immature Granulocytes 0 Not Estab. %   Immature Grans (Abs) 0.0 0.0 - 0.1 x10E3/uL  Comp Met (CMET)  Result Value Ref Range   Glucose 87 65 - 99 mg/dL   BUN 16 8 - 27 mg/dL   Creatinine, Ser 1.18 0.76 - 1.27 mg/dL   GFR calc non Af Amer 63 >59 mL/min/1.73   GFR calc Af Amer 72 >59 mL/min/1.73   BUN/Creatinine Ratio 14 10 - 24   Sodium 139 134 - 144 mmol/L   Potassium 3.7 3.5 - 5.2 mmol/L   Chloride 99 96 - 106 mmol/L   CO2 25 20 - 29 mmol/L     Calcium 9.7 8.6 - 10.2 mg/dL   Total Protein 7.1 6.0 - 8.5 g/dL   Albumin 4.5 3.6 - 4.8 g/dL   Globulin, Total 2.6 1.5 - 4.5 g/dL   Albumin/Globulin Ratio 1.7 1.2 - 2.2   Bilirubin Total 0.5 0.0 - 1.2 mg/dL   Alkaline Phosphatase 61 39 - 117 IU/L   AST 19 0 - 40 IU/L   ALT 16 0 - 44 IU/L  Lipid Panel w/o Chol/HDL Ratio  Result Value Ref Range   Cholesterol, Total 200 (H) 100 - 199 mg/dL   Triglycerides 231 (H) 0 - 149 mg/dL   HDL 50 >39 mg/dL   VLDL Cholesterol Cal 46 (H) 5 - 40 mg/dL   LDL Calculated 104 (H) 0 - 99 mg/dL  Urinalysis, Routine w reflex microscopic  Result Value Ref Range   Specific Gravity, UA 1.010 1.005 - 1.030   pH, UA 6.0 5.0 - 7.5   Color, UA Yellow Yellow   Appearance Ur Clear Clear   Leukocytes, UA Trace (A) Negative   Protein, UA Negative Negative/Trace   Glucose, UA Negative Negative   Ketones, UA Negative Negative   RBC, UA Negative Negative   Bilirubin, UA Negative Negative   Urobilinogen, Ur 0.2 0.2 - 1.0 mg/dL   Nitrite, UA Negative Negative   Microscopic Examination See below:   PSA  Result Value Ref Range   Prostate Specific Ag, Serum 1.8 0.0 - 4.0 ng/mL  TSH  Result Value Ref Range   TSH 0.889 0.450 - 4.500 uIU/mL  Hepatitis C antibody screen  Result Value Ref Range   Hep C Virus Ab <0.1 0.0 - 0.9 s/co ratio      Assessment & Plan:   Problem List Items Addressed This Visit      Cardiovascular and Mediastinum   Benign essential HTN    Discussed hypertension losartan may be contributing to patient's cough will discontinue losartan continue hydrochlorothiazide observe blood pressure and cough.  Blood pressures a little on the low side today so will not replace losartan.      Relevant Medications   amLODipine (NORVASC) 5 MG tablet     Endocrine   Hypothyroidism    The current medical regimen is effective;  continue present plan and medications.       Relevant Medications   levothyroxine (SYNTHROID, LEVOTHROID) 137 MCG tablet      Genitourinary   BPH with obstruction/lower urinary tract symptoms    The  current medical regimen is effective;  continue present plan and medications.         Other   Pernicious anemia    The current medical regimen is effective;  continue present plan and medications.       Relevant Medications   Cyanocobalamin (RA VITAMIN B-12 TR) 1000 MCG TBCR   Advanced care planning/counseling discussion    A voluntary discussion about advanced care planning including explanation and discussion of advanced directives was extentively discussed with the patient.  Explained about the healthcare proxy and living will was reviewed and packet with forms with expiration of how to fill them out was given.  Time spent: Encounter 16+ min individuals present: Patient          Follow up plan: Return in about 1 month (around 08/16/2017), or if symptoms worsen or fail to improve, for Check blood pressure and cough response with stopping losartan.Marland Kitchen

## 2017-07-19 NOTE — Assessment & Plan Note (Signed)
Discussed hypertension losartan may be contributing to patient's cough will discontinue losartan continue hydrochlorothiazide observe blood pressure and cough.  Blood pressures a little on the low side today so will not replace losartan.

## 2017-07-19 NOTE — Assessment & Plan Note (Signed)
A voluntary discussion about advanced care planning including explanation and discussion of advanced directives was extentively discussed with the patient.  Explained about the healthcare proxy and living will was reviewed and packet with forms with expiration of how to fill them out was given.  Time spent: Encounter 16+ min individuals present: Patient 

## 2017-07-19 NOTE — Assessment & Plan Note (Signed)
The current medical regimen is effective;  continue present plan and medications.  

## 2017-07-28 NOTE — Telephone Encounter (Signed)
Copied from [place] [id number]. Topic: Quick Communication - See Telephone Encounter >> Jul 28, 2017  3:47 PM Antonieta Iba C wrote: CRM for notification. See Telephone encounter for: 07/28/17.  [employer] called in to receive clarity on Rx for Cyanocobalamin (RA VITAMIN B-12 TR) 1000 MCG TBCR they do not have medication in stock, they do have regular vitamin b-12 available.    Please advise. - [id number] Ref# [id number]

## 2017-07-28 NOTE — Telephone Encounter (Signed)
OK to give regular B12, OK to give Rx verbally

## 2017-07-28 NOTE — Telephone Encounter (Signed)
Medication called in 

## 2017-08-23 DIAGNOSIS — M5136 Other intervertebral disc degeneration, lumbar region: Secondary | ICD-10-CM | POA: Diagnosis not present

## 2017-08-23 DIAGNOSIS — M9903 Segmental and somatic dysfunction of lumbar region: Secondary | ICD-10-CM | POA: Diagnosis not present

## 2017-08-23 DIAGNOSIS — M5432 Sciatica, left side: Secondary | ICD-10-CM | POA: Diagnosis not present

## 2017-08-23 DIAGNOSIS — M9905 Segmental and somatic dysfunction of pelvic region: Secondary | ICD-10-CM | POA: Diagnosis not present

## 2017-08-24 ENCOUNTER — Encounter: Payer: Self-pay | Admitting: Family Medicine

## 2017-08-24 ENCOUNTER — Ambulatory Visit (INDEPENDENT_AMBULATORY_CARE_PROVIDER_SITE_OTHER): Payer: Medicare Other | Admitting: Family Medicine

## 2017-08-24 DIAGNOSIS — I10 Essential (primary) hypertension: Secondary | ICD-10-CM

## 2017-08-24 DIAGNOSIS — R079 Chest pain, unspecified: Secondary | ICD-10-CM

## 2017-08-24 MED ORDER — LOSARTAN POTASSIUM 100 MG PO TABS: 100.0000 mg | ORAL_TABLET | Freq: Every day | ORAL | 3 refills | Status: DC

## 2017-08-24 NOTE — Assessment & Plan Note (Signed)
Chest pain with exertion refer to cardiology to further evaluate

## 2017-08-24 NOTE — Assessment & Plan Note (Signed)
Discuss hypertension and medications. Will discontinue amlodipine because of ankle edema. Will restart losartan 100 mg as stopping did not make any difference with his cough. Recheck blood pressure 1 month or so did not restart hydrochlorothiazide is patient's only been taking amlodipine 5 mg with good control.

## 2017-08-24 NOTE — Addendum Note (Signed)
Addended by: [name] A on: 08/24/2017 12:30 PM   Modules accepted: Orders

## 2017-08-24 NOTE — Progress Notes (Addendum)
BP 136/74 (BP Location: Left Arm, Patient Position: Sitting, Cuff Size: Normal)   Pulse 78   Temp 98.7 F (37.1 C) (Tympanic)   Ht '5\' 10"'  (1.778 m)   Wt 217 lb 14.4 oz (98.8 kg)   SpO2 98%   BMI 31.27 kg/m    Subjective:    Patient ID: [name], male    DOB: [birth year], 67 y.o.   MRN: [id number]  HPI: JAMIAN ANDUJO is a 67 y.o. male  Chief Complaint  Patient presents with  . Hypertension  . Medication Management   With chart closed and completed and going out the door patient mentions all by the way to have worsening shortness of breath with exertion and concerned about an enlarged heart. On further questioning patient with declining conditioning and inactivity due to chest pressure tightness comes on with exertion.  Goes away with rest.  This is been ongoing for over a year but gotten worse this summer.  Patient follow-up hypertension blood pressure good control with no problems with blood pressure. Patient stopped losartan hydrochlorothiazide because of concern about cough may be caused by losartan.  Patient may be has noticed a little decrease in cough but continues to cough the same. Has not been taking hydrochlorothiazide. Has been taking amlodipine 5 mg and is complaining of ankle swelling.  Relevant past medical, surgical, family and social history reviewed and updated as indicated. Interim medical history since our last visit reviewed. Allergies and medications reviewed and updated.  Review of Systems  Constitutional: Negative.   Respiratory: Negative.   Cardiovascular: Negative.     Per HPI unless specifically indicated above     Objective:    BP 136/74 (BP Location: Left Arm, Patient Position: Sitting, Cuff Size: Normal)   Pulse 78   Temp 98.7 F (37.1 C) (Tympanic)   Ht '5\' 10"'  (1.778 m)   Wt 217 lb 14.4 oz (98.8 kg)   SpO2 98%   BMI 31.27 kg/m   Wt Readings from Last 3 Encounters:  08/24/17 217 lb 14.4 oz (98.8 kg)  07/19/17 215 lb (97.5  kg)  06/23/17 213 lb 11.2 oz (96.9 kg)    Physical Exam  Constitutional: He is oriented to person, place, and time. He appears well-developed and well-nourished.  HENT:  Head: Normocephalic and atraumatic.  Eyes: Conjunctivae and EOM are normal.  Neck: Normal range of motion.  Cardiovascular: Normal rate, regular rhythm and normal heart sounds.  Pulmonary/Chest: Effort normal and breath sounds normal.  Musculoskeletal: Normal range of motion.  Neurological: He is alert and oriented to person, place, and time.  Skin: No erythema.  2 + ankle edema  Psychiatric: He has a normal mood and affect. His behavior is normal. Judgment and thought content normal.   EKG no acute changes  Results for orders placed or performed in visit on 06/23/17  Microscopic Examination  Result Value Ref Range   WBC, UA None seen 0 - 5 /hpf   RBC, UA None seen 0 - 2 /hpf   Epithelial Cells (non renal) CANCELED    Bacteria, UA None seen None seen/Few  CBC with Differential  Result Value Ref Range   WBC 8.3 3.4 - 10.8 x10E3/uL   RBC 4.44 4.14 - 5.80 x10E6/uL   Hemoglobin 14.4 13.0 - 17.7 g/dL   Hematocrit 42.2 37.5 - 51.0 %   MCV 95 79 - 97 fL   MCH 32.4 26.6 - 33.0 pg   MCHC 34.1 31.5 - 35.7 g/dL  RDW 13.9 12.3 - 15.4 %   Platelets 173 150 - 450 x10E3/uL   Neutrophils 61 Not Estab. %   Lymphs 27 Not Estab. %   Monocytes 9 Not Estab. %   Eos 2 Not Estab. %   Basos 1 Not Estab. %   Neutrophils Absolute 5.0 1.4 - 7.0 x10E3/uL   Lymphocytes Absolute 2.3 0.7 - 3.1 x10E3/uL   Monocytes Absolute 0.8 0.1 - 0.9 x10E3/uL   EOS (ABSOLUTE) 0.2 0.0 - 0.4 x10E3/uL   Basophils Absolute 0.1 0.0 - 0.2 x10E3/uL   Immature Granulocytes 0 Not Estab. %   Immature Grans (Abs) 0.0 0.0 - 0.1 x10E3/uL  Comp Met (CMET)  Result Value Ref Range   Glucose 87 65 - 99 mg/dL   BUN 16 8 - 27 mg/dL   Creatinine, Ser 1.18 0.76 - 1.27 mg/dL   GFR calc non Af Amer 63 >59 mL/min/1.73   GFR calc Af Amer 72 >59 mL/min/1.73    BUN/Creatinine Ratio 14 10 - 24   Sodium 139 134 - 144 mmol/L   Potassium 3.7 3.5 - 5.2 mmol/L   Chloride 99 96 - 106 mmol/L   CO2 25 20 - 29 mmol/L   Calcium 9.7 8.6 - 10.2 mg/dL   Total Protein 7.1 6.0 - 8.5 g/dL   Albumin 4.5 3.6 - 4.8 g/dL   Globulin, Total 2.6 1.5 - 4.5 g/dL   Albumin/Globulin Ratio 1.7 1.2 - 2.2   Bilirubin Total 0.5 0.0 - 1.2 mg/dL   Alkaline Phosphatase 61 39 - 117 IU/L   AST 19 0 - 40 IU/L   ALT 16 0 - 44 IU/L  Lipid Panel w/o Chol/HDL Ratio  Result Value Ref Range   Cholesterol, Total 200 (H) 100 - 199 mg/dL   Triglycerides 231 (H) 0 - 149 mg/dL   HDL 50 >39 mg/dL   VLDL Cholesterol Cal 46 (H) 5 - 40 mg/dL   LDL Calculated 104 (H) 0 - 99 mg/dL  Urinalysis, Routine w reflex microscopic  Result Value Ref Range   Specific Gravity, UA 1.010 1.005 - 1.030   pH, UA 6.0 5.0 - 7.5   Color, UA Yellow Yellow   Appearance Ur Clear Clear   Leukocytes, UA Trace (A) Negative   Protein, UA Negative Negative/Trace   Glucose, UA Negative Negative   Ketones, UA Negative Negative   RBC, UA Negative Negative   Bilirubin, UA Negative Negative   Urobilinogen, Ur 0.2 0.2 - 1.0 mg/dL   Nitrite, UA Negative Negative   Microscopic Examination See below:   PSA  Result Value Ref Range   Prostate Specific Ag, Serum 1.8 0.0 - 4.0 ng/mL  TSH  Result Value Ref Range   TSH 0.889 0.450 - 4.500 uIU/mL  Hepatitis C antibody screen  Result Value Ref Range   Hep C Virus Ab <0.1 0.0 - 0.9 s/co ratio      Assessment & Plan:   Problem List Items Addressed This Visit      Cardiovascular and Mediastinum   Benign essential HTN    Discuss hypertension and medications. Will discontinue amlodipine because of ankle edema. Will restart losartan 100 mg as stopping did not make any difference with his cough. Recheck blood pressure 1 month or so did not restart hydrochlorothiazide is patient's only been taking amlodipine 5 mg with good control.      Relevant Medications   losartan  (COZAAR) 100 MG tablet   Other Relevant Orders   Ambulatory referral to Cardiology  Other   Chest pain    Chest pain with exertion refer to cardiology to further evaluate      Relevant Orders   EKG 12-Lead (Completed)   Ambulatory referral to Cardiology      Also discuss patient's frequency.  Reviewed labs indicating very dilute urine.  On review patient drinks a great deal of fluids during the day.  Discussed cutting back on fluids to cut back on urinating.  Follow up plan: Return in about 1 month (around 09/21/2017) for bp recheck.

## 2017-09-05 DIAGNOSIS — E669 Obesity, unspecified: Secondary | ICD-10-CM | POA: Diagnosis not present

## 2017-09-05 DIAGNOSIS — I10 Essential (primary) hypertension: Secondary | ICD-10-CM | POA: Diagnosis not present

## 2017-09-05 DIAGNOSIS — M159 Polyosteoarthritis, unspecified: Secondary | ICD-10-CM | POA: Diagnosis not present

## 2017-09-05 DIAGNOSIS — G4733 Obstructive sleep apnea (adult) (pediatric): Secondary | ICD-10-CM | POA: Diagnosis not present

## 2017-09-13 DIAGNOSIS — M5432 Sciatica, left side: Secondary | ICD-10-CM | POA: Diagnosis not present

## 2017-09-13 DIAGNOSIS — M9905 Segmental and somatic dysfunction of pelvic region: Secondary | ICD-10-CM | POA: Diagnosis not present

## 2017-09-13 DIAGNOSIS — M5136 Other intervertebral disc degeneration, lumbar region: Secondary | ICD-10-CM | POA: Diagnosis not present

## 2017-09-13 DIAGNOSIS — M9903 Segmental and somatic dysfunction of lumbar region: Secondary | ICD-10-CM | POA: Diagnosis not present

## 2017-10-05 DIAGNOSIS — M5136 Other intervertebral disc degeneration, lumbar region: Secondary | ICD-10-CM | POA: Diagnosis not present

## 2017-10-05 DIAGNOSIS — M5432 Sciatica, left side: Secondary | ICD-10-CM | POA: Diagnosis not present

## 2017-10-05 DIAGNOSIS — M9903 Segmental and somatic dysfunction of lumbar region: Secondary | ICD-10-CM | POA: Diagnosis not present

## 2017-10-05 DIAGNOSIS — M9905 Segmental and somatic dysfunction of pelvic region: Secondary | ICD-10-CM | POA: Diagnosis not present

## 2017-10-07 DIAGNOSIS — M9903 Segmental and somatic dysfunction of lumbar region: Secondary | ICD-10-CM | POA: Diagnosis not present

## 2017-10-07 DIAGNOSIS — M5136 Other intervertebral disc degeneration, lumbar region: Secondary | ICD-10-CM | POA: Diagnosis not present

## 2017-10-07 DIAGNOSIS — M5432 Sciatica, left side: Secondary | ICD-10-CM | POA: Diagnosis not present

## 2017-10-07 DIAGNOSIS — M9905 Segmental and somatic dysfunction of pelvic region: Secondary | ICD-10-CM | POA: Diagnosis not present

## 2017-10-10 DIAGNOSIS — M5136 Other intervertebral disc degeneration, lumbar region: Secondary | ICD-10-CM | POA: Diagnosis not present

## 2017-10-10 DIAGNOSIS — M9905 Segmental and somatic dysfunction of pelvic region: Secondary | ICD-10-CM | POA: Diagnosis not present

## 2017-10-10 DIAGNOSIS — M9903 Segmental and somatic dysfunction of lumbar region: Secondary | ICD-10-CM | POA: Diagnosis not present

## 2017-10-10 DIAGNOSIS — M5432 Sciatica, left side: Secondary | ICD-10-CM | POA: Diagnosis not present

## 2017-10-14 DIAGNOSIS — M9903 Segmental and somatic dysfunction of lumbar region: Secondary | ICD-10-CM | POA: Diagnosis not present

## 2017-10-14 DIAGNOSIS — M9905 Segmental and somatic dysfunction of pelvic region: Secondary | ICD-10-CM | POA: Diagnosis not present

## 2017-10-14 DIAGNOSIS — M5432 Sciatica, left side: Secondary | ICD-10-CM | POA: Diagnosis not present

## 2017-10-14 DIAGNOSIS — M5136 Other intervertebral disc degeneration, lumbar region: Secondary | ICD-10-CM | POA: Diagnosis not present

## 2017-10-17 DIAGNOSIS — M9905 Segmental and somatic dysfunction of pelvic region: Secondary | ICD-10-CM | POA: Diagnosis not present

## 2017-10-17 DIAGNOSIS — M5432 Sciatica, left side: Secondary | ICD-10-CM | POA: Diagnosis not present

## 2017-10-17 DIAGNOSIS — M9903 Segmental and somatic dysfunction of lumbar region: Secondary | ICD-10-CM | POA: Diagnosis not present

## 2017-10-17 DIAGNOSIS — M5136 Other intervertebral disc degeneration, lumbar region: Secondary | ICD-10-CM | POA: Diagnosis not present

## 2017-10-23 NOTE — Progress Notes (Signed)
Cardiology Office Note  Date:  10/25/2017   ID:  Joshua [name], DOB October 17, 1950, MRN [id number]  PCP:  Joshua Maple, MD   Chief Complaint  Patient presents with  . OTHER    Chest pain and HTN. Meds reviewed verbally with pt.    HPI:  [name] is a 67 yo male with PMH of  Hypertension Nonsmoker No diabetes Referred by Joshua [name] for consultation of his hypertension and chest pain  Reported to primary care he had shortness of breath, Concerned about " enlarged heart"  Inactive/sedentary Weight gain Reports having chest pain with exertion going on for 1 year getting worse over the summer High fluid intake  Previously stopped losartan out of concern for cough Not taking HCTZ On amlodipine had ankle swelling Restarted on losartan July 2019  It is chest he reports having symptoms of "electrical hits" then would go away,  Would present at rest When turning, left flank pain "slowed down a lot", sedentary  MRI head Minimal chronic small vessel ischemia in the left cerebellar Hemisphere,  EKG personally reviewed by myself on todays visit Shows normal sinus rhythm rate 68 bpm right bundle branch block no significant ST or T wave changes  Previous imaging work-up  carotid ultrasound February 2018 No significant disease noted  Abdominal ultrasound 2015 No aneurysm  PMH:   has a past medical history of Allergy, Amnesia (06/13/2014), B12 deficiency (06/13/2014), Benign essential HTN (10/30/2014), BPH (benign prostatic hyperplasia), BPH with obstruction/lower urinary tract symptoms (05/15/2015), Hypogonadism in male, and Hypothyroidism (10/30/2014).  PSH:    Past Surgical History:  Procedure Laterality Date  . COLONOSCOPY  March 2016  . ESOPHAGOGASTRODUODENOSCOPY ENDOSCOPY  March 2016  . EYE SURGERY Bilateral    cataracts  . KNEE SURGERY Right   . SHOULDER SURGERY Left   . TONSILLECTOMY      Current Outpatient Medications  Medication Sig Dispense Refill   . Cyanocobalamin (RA VITAMIN B-12 TR) 1000 MCG TBCR Take 1 tablet (1,000 mcg total) by mouth daily. 90 tablet 4  . levothyroxine (SYNTHROID, LEVOTHROID) 137 MCG tablet Take 1 tablet (137 mcg total) by mouth daily before breakfast. 90 tablet 4  . losartan (COZAAR) 100 MG tablet Take 1 tablet (100 mg total) by mouth daily. 90 tablet 3   No current facility-administered medications for this visit.      Allergies:   Aspirin; Celery oil; Flomax [tamsulosin hcl]; Hydromorphone; Oysters [shellfish allergy]; Amlodipine besylate; and Latex   Social History:  The patient  reports that he has never smoked. He has never used smokeless tobacco. He reports that he drinks alcohol. He reports that he does not use drugs.   Family History:   family history includes Breast cancer in his mother; COPD in his father; Heart disease in his father; Prostate cancer in his father.    Review of Systems: Review of Systems  Constitutional: Negative.   Respiratory: Negative.   Cardiovascular: Positive for chest pain.  Gastrointestinal: Negative.   Musculoskeletal: Negative.   Neurological: Negative.   Psychiatric/Behavioral: Negative.   All other systems reviewed and are negative.   PHYSICAL EXAM: VS:  BP 133/81 (BP Location: Right Arm, Patient Position: Sitting, Cuff Size: Large)   Pulse 68   Ht 5\' 7"  (1.702 m)   Wt 217 lb 4 oz (98.5 kg)   BMI 34.03 kg/m  , BMI Body mass index is 34.03 kg/m. GEN: Well nourished, well developed, in no acute distress , obese HEENT: normal  Neck:  no JVD, carotid bruits, or masses Cardiac: RRR; no murmurs, rubs, or gallops,no edema  Respiratory:  clear to auscultation bilaterally, normal work of breathing GI: soft, nontender, nondistended, + BS MS: no deformity or atrophy  Skin: warm and dry, no rash Neuro:  Strength and sensation are intact Psych: euthymic mood, full affect   Recent Labs: 06/23/2017: ALT 16; BUN 16; Creatinine, Ser 1.18; Hemoglobin 14.4; Platelets  173; Potassium 3.7; Sodium 139; TSH 0.889    Lipid Panel Lab Results  Component Value Date   CHOL 200 (H) 06/23/2017   HDL 50 06/23/2017   LDLCALC 104 (H) 06/23/2017   TRIG 231 (H) 06/23/2017      Wt Readings from Last 3 Encounters:  10/25/17 217 lb 4 oz (98.5 kg)  08/24/17 217 lb 14.4 oz (98.8 kg)  07/19/17 215 lb (97.5 kg)       ASSESSMENT AND PLAN:  Small vessel disease ([place]) Previous MRI confirming small vessel disease Discussed with him in detail, he was not aware Currently not on aspirin or statin Discussed the risk and benefit of these medications  Chest pain, unspecified type - Plan: EKG 12-Lead Etiology unclear, atypical in general Reproducible with movement, twisting Given small vessel disease as detailed above, recommended a risk stratification studies such as CT coronary calcium scoring He would like to think about it and will call us back if he would like this ordered  Essential hypertension Blood pressure is well controlled on today's visit. No changes made to the medications.  Disposition:   F/U as needed   Total encounter time more than 60 minutes  Greater than 50% was spent in counseling and coordination of care with the patient   Orders Placed This Encounter  Procedures  . EKG 12-Lead     Signed, Joshua [name], M.D., Ph.D. 10/25/2017  [place], [place]

## 2017-10-25 ENCOUNTER — Ambulatory Visit (INDEPENDENT_AMBULATORY_CARE_PROVIDER_SITE_OTHER): Payer: Medicare Other | Admitting: Cardiovascular Disease

## 2017-10-25 ENCOUNTER — Encounter: Payer: Self-pay | Admitting: Cardiovascular Disease

## 2017-10-25 VITALS — BP 133/81 | HR 68 | Ht 67.0 in | Wt 217.2 lb

## 2017-10-25 DIAGNOSIS — I739 Peripheral vascular disease, unspecified: Secondary | ICD-10-CM | POA: Diagnosis not present

## 2017-10-25 DIAGNOSIS — R079 Chest pain, unspecified: Secondary | ICD-10-CM | POA: Diagnosis not present

## 2017-10-25 DIAGNOSIS — I10 Essential (primary) hypertension: Secondary | ICD-10-CM

## 2017-10-25 NOTE — Patient Instructions (Signed)
Medication Instructions:   No medication changes made  Labwork:  No new labs needed  Testing/Procedures:  Please call if you would like a CT coronary calcium score $150   Follow-Up: It was a pleasure seeing you in the office today. Please call us if you have new issues that need to be addressed before your next appt.  [phone]  Your physician wants you to follow-up in:  As needed  If you need a refill on your cardiac medications before your next appointment, please call your pharmacy.  For educational health videos Log in to : [email or web address] Or : [email or web address], password : [email or web address]

## 2017-10-26 DIAGNOSIS — M5432 Sciatica, left side: Secondary | ICD-10-CM | POA: Diagnosis not present

## 2017-10-26 DIAGNOSIS — M9905 Segmental and somatic dysfunction of pelvic region: Secondary | ICD-10-CM | POA: Diagnosis not present

## 2017-10-26 DIAGNOSIS — M5136 Other intervertebral disc degeneration, lumbar region: Secondary | ICD-10-CM | POA: Diagnosis not present

## 2017-10-26 DIAGNOSIS — M9903 Segmental and somatic dysfunction of lumbar region: Secondary | ICD-10-CM | POA: Diagnosis not present

## 2017-10-31 DIAGNOSIS — M9905 Segmental and somatic dysfunction of pelvic region: Secondary | ICD-10-CM | POA: Diagnosis not present

## 2017-10-31 DIAGNOSIS — Z23 Encounter for immunization: Secondary | ICD-10-CM | POA: Diagnosis not present

## 2017-10-31 DIAGNOSIS — M5136 Other intervertebral disc degeneration, lumbar region: Secondary | ICD-10-CM | POA: Diagnosis not present

## 2017-10-31 DIAGNOSIS — M5432 Sciatica, left side: Secondary | ICD-10-CM | POA: Diagnosis not present

## 2017-10-31 DIAGNOSIS — M9903 Segmental and somatic dysfunction of lumbar region: Secondary | ICD-10-CM | POA: Diagnosis not present

## 2017-11-03 ENCOUNTER — Ambulatory Visit (INDEPENDENT_AMBULATORY_CARE_PROVIDER_SITE_OTHER): Payer: Medicare Other | Admitting: Family Medicine

## 2017-11-03 ENCOUNTER — Encounter: Payer: Self-pay | Admitting: Family Medicine

## 2017-11-03 DIAGNOSIS — E039 Hypothyroidism, unspecified: Secondary | ICD-10-CM | POA: Diagnosis not present

## 2017-11-03 DIAGNOSIS — D510 Vitamin B12 deficiency anemia due to intrinsic factor deficiency: Secondary | ICD-10-CM | POA: Diagnosis not present

## 2017-11-03 DIAGNOSIS — I10 Essential (primary) hypertension: Secondary | ICD-10-CM | POA: Diagnosis not present

## 2017-11-03 NOTE — Assessment & Plan Note (Signed)
The current medical regimen is effective;  continue present plan and medications. a 

## 2017-11-03 NOTE — Assessment & Plan Note (Signed)
The current medical regimen is effective;  continue present plan and medications.  

## 2017-11-03 NOTE — Progress Notes (Signed)
BP 132/80 (BP Location: Left Arm)   Pulse 71   Temp 98.4 F (36.9 C)   Ht _0  (1.702 m)   Wt 217 lb 8 oz (98.7 kg)   SpO2 97%   BMI 34.07 kg/m    Subjective:    Patient ID: Joshua [name], male    DOB: May 31, 1950, 67 y.o.   MRN: [id number]  HPI: Joshua [name] is a 67 y.o. male  Chief Complaint  Patient presents with  . Hypertension  Patient follow-up hypertension doing well no problems with losartan 2 mg once a day. Taking thyroid medicines without problems taking it on a regular basis and daily. Taking B12 for pernicious anemia without issues or problems. No other complaints  Reviewed cardiology notes patient stable with no problems or issues at this time. Patient also had abscess on the left flank which is drained without problems and is resolving.  Relevant past medical, surgical, family and social history reviewed and updated as indicated. Interim medical history since our last visit reviewed. Allergies and medications reviewed and updated.  Review of Systems  Constitutional: Negative.   Respiratory: Negative.   Cardiovascular: Negative.     Per HPI unless specifically indicated above     Objective:    BP 132/80 (BP Location: Left Arm)   Pulse 71   Temp 98.4 F (36.9 C)   Ht _1  (1.702 m)   Wt 217 lb 8 oz (98.7 kg)   SpO2 97%   BMI 34.07 kg/m   Wt Readings from Last 3 Encounters:  11/03/17 217 lb 8 oz (98.7 kg)  10/25/17 217 lb 4 oz (98.5 kg)  08/24/17 217 lb 14.4 oz (98.8 kg)    Physical Exam  Constitutional: He is oriented to person, place, and time. He appears well-developed and well-nourished.  HENT:  Head: Normocephalic and atraumatic.  Eyes: Conjunctivae and EOM are normal.  Neck: Normal range of motion.  Cardiovascular: Normal rate, regular rhythm and normal heart sounds.  Pulmonary/Chest: Effort normal and breath sounds normal.  Musculoskeletal: Normal range of motion.  Neurological: He is alert and oriented to person, place, and  time.  Skin: No erythema.  Psychiatric: He has a normal mood and affect. His behavior is normal. Judgment and thought content normal.    Results for orders placed or performed in visit on 06/23/17  Microscopic Examination  Result Value Ref Range   WBC, UA None seen 0 - 5 /hpf   RBC, UA None seen 0 - 2 /hpf   Epithelial Cells (non renal) CANCELED    Bacteria, UA None seen None seen/Few  CBC with Differential  Result Value Ref Range   WBC 8.3 3.4 - 10.8 x10E3/uL   RBC 4.44 4.14 - 5.80 x10E6/uL   Hemoglobin 14.4 13.0 - 17.7 g/dL   Hematocrit 42.2 37.5 - 51.0 %   MCV 95 79 - 97 fL   MCH 32.4 26.6 - 33.0 pg   MCHC 34.1 31.5 - 35.7 g/dL   RDW 13.9 12.3 - 15.4 %   Platelets 173 150 - 450 x10E3/uL   Neutrophils 61 Not Estab. %   Lymphs 27 Not Estab. %   Monocytes 9 Not Estab. %   Eos 2 Not Estab. %   Basos 1 Not Estab. %   Neutrophils Absolute 5.0 1.4 - 7.0 x10E3/uL   Lymphocytes Absolute 2.3 0.7 - 3.1 x10E3/uL   Monocytes Absolute 0.8 0.1 - 0.9 x10E3/uL   EOS (ABSOLUTE) 0.2 0.0 - 0.4 x10E3/uL  Basophils Absolute 0.1 0.0 - 0.2 x10E3/uL   Immature Granulocytes 0 Not Estab. %   Immature Grans (Abs) 0.0 0.0 - 0.1 x10E3/uL  Comp Met (CMET)  Result Value Ref Range   Glucose 87 65 - 99 mg/dL   BUN 16 8 - 27 mg/dL   Creatinine, Ser 1.18 0.76 - 1.27 mg/dL   GFR calc non Af Amer 63 >59 mL/min/1.73   GFR calc Af Amer 72 >59 mL/min/1.73   BUN/Creatinine Ratio 14 10 - 24   Sodium 139 134 - 144 mmol/L   Potassium 3.7 3.5 - 5.2 mmol/L   Chloride 99 96 - 106 mmol/L   CO2 25 20 - 29 mmol/L   Calcium 9.7 8.6 - 10.2 mg/dL   Total Protein 7.1 6.0 - 8.5 g/dL   Albumin 4.5 3.6 - 4.8 g/dL   Globulin, Total 2.6 1.5 - 4.5 g/dL   Albumin/Globulin Ratio 1.7 1.2 - 2.2   Bilirubin Total 0.5 0.0 - 1.2 mg/dL   Alkaline Phosphatase 61 39 - 117 IU/L   AST 19 0 - 40 IU/L   ALT 16 0 - 44 IU/L  Lipid Panel w/o Chol/HDL Ratio  Result Value Ref Range   Cholesterol, Total 200 (H) 100 - 199 mg/dL    Triglycerides 231 (H) 0 - 149 mg/dL   HDL 50 >39 mg/dL   VLDL Cholesterol Cal 46 (H) 5 - 40 mg/dL   LDL Calculated 104 (H) 0 - 99 mg/dL  Urinalysis, Routine w reflex microscopic  Result Value Ref Range   Specific Gravity, UA 1.010 1.005 - 1.030   pH, UA 6.0 5.0 - 7.5   Color, UA Yellow Yellow   Appearance Ur Clear Clear   Leukocytes, UA Trace (A) Negative   Protein, UA Negative Negative/Trace   Glucose, UA Negative Negative   Ketones, UA Negative Negative   RBC, UA Negative Negative   Bilirubin, UA Negative Negative   Urobilinogen, Ur 0.2 0.2 - 1.0 mg/dL   Nitrite, UA Negative Negative   Microscopic Examination See below:   PSA  Result Value Ref Range   Prostate Specific Ag, Serum 1.8 0.0 - 4.0 ng/mL  TSH  Result Value Ref Range   TSH 0.889 0.450 - 4.500 uIU/mL  Hepatitis C antibody screen  Result Value Ref Range   Hep C Virus Ab <0.1 0.0 - 0.9 s/co ratio      Assessment & Plan:   Problem List Items Addressed This Visit      Cardiovascular and Mediastinum   Benign essential HTN    The current medical regimen is effective;  continue present plan and medications.         Endocrine   Hypothyroidism    The current medical regimen is effective;  continue present plan and medications.         Other   Pernicious anemia    The current medical regimen is effective;  continue present plan and medications. a          Follow up plan: Return in about 3 months (around 02/03/2018) for   med check BMP, CBC.

## 2017-11-07 DIAGNOSIS — M5136 Other intervertebral disc degeneration, lumbar region: Secondary | ICD-10-CM | POA: Diagnosis not present

## 2017-11-07 DIAGNOSIS — M5432 Sciatica, left side: Secondary | ICD-10-CM | POA: Diagnosis not present

## 2017-11-07 DIAGNOSIS — M9903 Segmental and somatic dysfunction of lumbar region: Secondary | ICD-10-CM | POA: Diagnosis not present

## 2017-11-07 DIAGNOSIS — M9905 Segmental and somatic dysfunction of pelvic region: Secondary | ICD-10-CM | POA: Diagnosis not present

## 2017-11-21 DIAGNOSIS — M9903 Segmental and somatic dysfunction of lumbar region: Secondary | ICD-10-CM | POA: Diagnosis not present

## 2017-11-21 DIAGNOSIS — M5432 Sciatica, left side: Secondary | ICD-10-CM | POA: Diagnosis not present

## 2017-11-21 DIAGNOSIS — M5136 Other intervertebral disc degeneration, lumbar region: Secondary | ICD-10-CM | POA: Diagnosis not present

## 2017-11-21 DIAGNOSIS — M9905 Segmental and somatic dysfunction of pelvic region: Secondary | ICD-10-CM | POA: Diagnosis not present

## 2017-12-05 DIAGNOSIS — M9905 Segmental and somatic dysfunction of pelvic region: Secondary | ICD-10-CM | POA: Diagnosis not present

## 2017-12-05 DIAGNOSIS — M5432 Sciatica, left side: Secondary | ICD-10-CM | POA: Diagnosis not present

## 2017-12-05 DIAGNOSIS — M9903 Segmental and somatic dysfunction of lumbar region: Secondary | ICD-10-CM | POA: Diagnosis not present

## 2017-12-05 DIAGNOSIS — M5136 Other intervertebral disc degeneration, lumbar region: Secondary | ICD-10-CM | POA: Diagnosis not present

## 2017-12-19 DIAGNOSIS — M9903 Segmental and somatic dysfunction of lumbar region: Secondary | ICD-10-CM | POA: Diagnosis not present

## 2017-12-19 DIAGNOSIS — M9905 Segmental and somatic dysfunction of pelvic region: Secondary | ICD-10-CM | POA: Diagnosis not present

## 2017-12-19 DIAGNOSIS — M5432 Sciatica, left side: Secondary | ICD-10-CM | POA: Diagnosis not present

## 2017-12-19 DIAGNOSIS — M5136 Other intervertebral disc degeneration, lumbar region: Secondary | ICD-10-CM | POA: Diagnosis not present

## 2017-12-21 DIAGNOSIS — M170 Bilateral primary osteoarthritis of knee: Secondary | ICD-10-CM | POA: Diagnosis not present

## 2017-12-28 DIAGNOSIS — M170 Bilateral primary osteoarthritis of knee: Secondary | ICD-10-CM | POA: Diagnosis not present

## 2018-01-03 DIAGNOSIS — M9905 Segmental and somatic dysfunction of pelvic region: Secondary | ICD-10-CM | POA: Diagnosis not present

## 2018-01-03 DIAGNOSIS — M9903 Segmental and somatic dysfunction of lumbar region: Secondary | ICD-10-CM | POA: Diagnosis not present

## 2018-01-03 DIAGNOSIS — M5432 Sciatica, left side: Secondary | ICD-10-CM | POA: Diagnosis not present

## 2018-01-03 DIAGNOSIS — M5136 Other intervertebral disc degeneration, lumbar region: Secondary | ICD-10-CM | POA: Diagnosis not present

## 2018-01-04 ENCOUNTER — Encounter: Payer: Self-pay | Admitting: Family Medicine

## 2018-01-04 DIAGNOSIS — M170 Bilateral primary osteoarthritis of knee: Secondary | ICD-10-CM | POA: Diagnosis not present

## 2018-01-11 DIAGNOSIS — M170 Bilateral primary osteoarthritis of knee: Secondary | ICD-10-CM | POA: Diagnosis not present

## 2018-01-13 DIAGNOSIS — M170 Bilateral primary osteoarthritis of knee: Secondary | ICD-10-CM | POA: Diagnosis not present

## 2018-01-18 DIAGNOSIS — M170 Bilateral primary osteoarthritis of knee: Secondary | ICD-10-CM | POA: Diagnosis not present

## 2018-01-20 DIAGNOSIS — M170 Bilateral primary osteoarthritis of knee: Secondary | ICD-10-CM | POA: Diagnosis not present

## 2018-01-31 DIAGNOSIS — M170 Bilateral primary osteoarthritis of knee: Secondary | ICD-10-CM | POA: Diagnosis not present

## 2018-02-06 ENCOUNTER — Ambulatory Visit: Payer: Medicare Other | Admitting: Family Medicine

## 2018-02-20 ENCOUNTER — Ambulatory Visit (INDEPENDENT_AMBULATORY_CARE_PROVIDER_SITE_OTHER): Payer: Medicare Other | Admitting: Family Medicine

## 2018-02-20 ENCOUNTER — Encounter: Payer: Self-pay | Admitting: Family Medicine

## 2018-02-20 VITALS — BP 135/85 | HR 68 | Ht 67.0 in | Wt 218.0 lb

## 2018-02-20 DIAGNOSIS — S41111A Laceration without foreign body of right upper arm, initial encounter: Secondary | ICD-10-CM | POA: Diagnosis not present

## 2018-02-20 DIAGNOSIS — E039 Hypothyroidism, unspecified: Secondary | ICD-10-CM | POA: Diagnosis not present

## 2018-02-20 DIAGNOSIS — E538 Deficiency of other specified B group vitamins: Secondary | ICD-10-CM | POA: Diagnosis not present

## 2018-02-20 DIAGNOSIS — I10 Essential (primary) hypertension: Secondary | ICD-10-CM

## 2018-02-20 DIAGNOSIS — Z23 Encounter for immunization: Secondary | ICD-10-CM

## 2018-02-20 NOTE — Assessment & Plan Note (Signed)
Wound is healing will need a tetanus shot

## 2018-02-20 NOTE — Progress Notes (Signed)
BP 135/85   Pulse 68   Ht '5\' 7"'  (1.702 m)   Wt 218 lb (98.9 kg)   SpO2 94%   BMI 34.14 kg/m    Subjective:    Patient ID: Joshua [name], male    DOB: 1950-12-30, 68 y.o.   MRN: [id number]  HPI: Joshua [name] is a 68 y.o. male  Chief Complaint  Patient presents with  . Follow-up  . Hypertension  . Fatigue    Feels like a manufactor change in thyroid medication in medication has been less effective for him  . Health Maintenance    TD; undecided, would like to discuss w/ provider first.   Patient's been having problems with fatigue after changed thyroid medication manufacture.  Patient is otherwise seems to be doing well with good blood pressure readings no chest pain chest tightness has been taking for low B12 been taking oral B12 is wondering about changing his medication.  To over-the-counter B12.  Relevant past medical, surgical, family and social history reviewed and updated as indicated. Interim medical history since our last visit reviewed. Allergies and medications reviewed and updated.  Review of Systems  Constitutional: Negative.   Respiratory: Negative.   Cardiovascular: Negative.     Per HPI unless specifically indicated above     Objective:    BP 135/85   Pulse 68   Ht '5\' 7"'  (1.702 m)   Wt 218 lb (98.9 kg)   SpO2 94%   BMI 34.14 kg/m   Wt Readings from Last 3 Encounters:  02/20/18 218 lb (98.9 kg)  11/03/17 217 lb 8 oz (98.7 kg)  10/25/17 217 lb 4 oz (98.5 kg)    Physical Exam Constitutional:      Appearance: He is well-developed.  HENT:     Head: Normocephalic and atraumatic.  Eyes:     Conjunctiva/sclera: Conjunctivae normal.  Neck:     Musculoskeletal: Normal range of motion.  Cardiovascular:     Rate and Rhythm: Normal rate and regular rhythm.     Heart sounds: Normal heart sounds.  Pulmonary:     Effort: Pulmonary effort is normal.     Breath sounds: Normal breath sounds.  Musculoskeletal: Normal range of motion.  Skin:  Findings: No erythema.  Neurological:     Mental Status: He is alert and oriented to person, place, and time.  Psychiatric:        Behavior: Behavior normal.        Thought Content: Thought content normal.        Judgment: Judgment normal.     Results for orders placed or performed in visit on 06/23/17  Microscopic Examination  Result Value Ref Range   WBC, UA None seen 0 - 5 /hpf   RBC, UA None seen 0 - 2 /hpf   Epithelial Cells (non renal) CANCELED    Bacteria, UA None seen None seen/Few  CBC with Differential  Result Value Ref Range   WBC 8.3 3.4 - 10.8 x10E3/uL   RBC 4.44 4.14 - 5.80 x10E6/uL   Hemoglobin 14.4 13.0 - 17.7 g/dL   Hematocrit 42.2 37.5 - 51.0 %   MCV 95 79 - 97 fL   MCH 32.4 26.6 - 33.0 pg   MCHC 34.1 31.5 - 35.7 g/dL   RDW 13.9 12.3 - 15.4 %   Platelets 173 150 - 450 x10E3/uL   Neutrophils 61 Not Estab. %   Lymphs 27 Not Estab. %   Monocytes 9 Not Estab. %  Eos 2 Not Estab. %   Basos 1 Not Estab. %   Neutrophils Absolute 5.0 1.4 - 7.0 x10E3/uL   Lymphocytes Absolute 2.3 0.7 - 3.1 x10E3/uL   Monocytes Absolute 0.8 0.1 - 0.9 x10E3/uL   EOS (ABSOLUTE) 0.2 0.0 - 0.4 x10E3/uL   Basophils Absolute 0.1 0.0 - 0.2 x10E3/uL   Immature Granulocytes 0 Not Estab. %   Immature Grans (Abs) 0.0 0.0 - 0.1 x10E3/uL  Comp Met (CMET)  Result Value Ref Range   Glucose 87 65 - 99 mg/dL   BUN 16 8 - 27 mg/dL   Creatinine, Ser 1.18 0.76 - 1.27 mg/dL   GFR calc non Af Amer 63 >59 mL/min/1.73   GFR calc Af Amer 72 >59 mL/min/1.73   BUN/Creatinine Ratio 14 10 - 24   Sodium 139 134 - 144 mmol/L   Potassium 3.7 3.5 - 5.2 mmol/L   Chloride 99 96 - 106 mmol/L   CO2 25 20 - 29 mmol/L   Calcium 9.7 8.6 - 10.2 mg/dL   Total Protein 7.1 6.0 - 8.5 g/dL   Albumin 4.5 3.6 - 4.8 g/dL   Globulin, Total 2.6 1.5 - 4.5 g/dL   Albumin/Globulin Ratio 1.7 1.2 - 2.2   Bilirubin Total 0.5 0.0 - 1.2 mg/dL   Alkaline Phosphatase 61 39 - 117 IU/L   AST 19 0 - 40 IU/L   ALT 16 0 - 44 IU/L   Lipid Panel w/o Chol/HDL Ratio  Result Value Ref Range   Cholesterol, Total 200 (H) 100 - 199 mg/dL   Triglycerides 231 (H) 0 - 149 mg/dL   HDL 50 >39 mg/dL   VLDL Cholesterol Cal 46 (H) 5 - 40 mg/dL   LDL Calculated 104 (H) 0 - 99 mg/dL  Urinalysis, Routine w reflex microscopic  Result Value Ref Range   Specific Gravity, UA 1.010 1.005 - 1.030   pH, UA 6.0 5.0 - 7.5   Color, UA Yellow Yellow   Appearance Ur Clear Clear   Leukocytes, UA Trace (A) Negative   Protein, UA Negative Negative/Trace   Glucose, UA Negative Negative   Ketones, UA Negative Negative   RBC, UA Negative Negative   Bilirubin, UA Negative Negative   Urobilinogen, Ur 0.2 0.2 - 1.0 mg/dL   Nitrite, UA Negative Negative   Microscopic Examination See below:   PSA  Result Value Ref Range   Prostate Specific Ag, Serum 1.8 0.0 - 4.0 ng/mL  TSH  Result Value Ref Range   TSH 0.889 0.450 - 4.500 uIU/mL  Hepatitis C antibody screen  Result Value Ref Range   Hep C Virus Ab <0.1 0.0 - 0.9 s/co ratio      Assessment & Plan:   Problem List Items Addressed This Visit      Cardiovascular and Mediastinum   Benign essential HTN - Primary    The current medical regimen is effective;  continue present plan and medications.       Relevant Orders   Basic metabolic panel   CBC w/Diff     Endocrine   Hypothyroidism    Patient taking his thyroid medication will check his TSH to make sure change in therapy has not changed his thyroid status.      Relevant Orders   Basic metabolic panel   CBC w/Diff   TSH     Other   B12 deficiency    Will check B12 levels in 6 months.  Patient will use over-the-counter B12 to make sure he has  pernicious anemia      Laceration of arm, right, initial encounter    Wound is healing will need a tetanus shot      Relevant Orders   Td : Tetanus/diphtheria >7yo Preservative  free    Other Visit Diagnoses    Need for Tdap vaccination       Relevant Orders   Td :  Tetanus/diphtheria >7yo Preservative  free       Follow up plan: Return in about 6 months (around 08/21/2018) for Physical Exam and check vitamin B12.

## 2018-02-20 NOTE — Assessment & Plan Note (Signed)
Patient taking his thyroid medication will check his TSH to make sure change in therapy has not changed his thyroid status.

## 2018-02-20 NOTE — Assessment & Plan Note (Signed)
Will check B12 levels in 6 months.  Patient will use over-the-counter B12 to make sure he has pernicious anemia

## 2018-02-20 NOTE — Assessment & Plan Note (Signed)
The current medical regimen is effective;  continue present plan and medications.  

## 2018-02-21 ENCOUNTER — Encounter: Payer: Self-pay | Admitting: Family Medicine

## 2018-02-21 LAB — CBC WITH DIFFERENTIAL/PLATELET
Basophils Absolute: 0 10*3/uL (ref 0.0–0.2)
Basos: 1 %
EOS (ABSOLUTE): 0.2 10*3/uL (ref 0.0–0.4)
Eos: 2 %
HEMOGLOBIN: 15.7 g/dL (ref 13.0–17.7)
Hematocrit: 45.9 % (ref 37.5–51.0)
Immature Grans (Abs): 0.1 10*3/uL (ref 0.0–0.1)
Immature Granulocytes: 1 %
LYMPHS ABS: 1.6 10*3/uL (ref 0.7–3.1)
Lymphs: 22 %
MCH: 31.7 pg (ref 26.6–33.0)
MCHC: 34.2 g/dL (ref 31.5–35.7)
MCV: 93 fL (ref 79–97)
Monocytes Absolute: 0.7 10*3/uL (ref 0.1–0.9)
Monocytes: 10 %
Neutrophils Absolute: 4.7 10*3/uL (ref 1.4–7.0)
Neutrophils: 64 %
Platelets: 178 10*3/uL (ref 150–450)
RBC: 4.95 x10E6/uL (ref 4.14–5.80)
RDW: 13 % (ref 11.6–15.4)
WBC: 7.3 10*3/uL (ref 3.4–10.8)

## 2018-02-21 LAB — BASIC METABOLIC PANEL
BUN / CREAT RATIO: 18 (ref 10–24)
BUN: 18 mg/dL (ref 8–27)
CHLORIDE: 99 mmol/L (ref 96–106)
CO2: 24 mmol/L (ref 20–29)
CREATININE: 1.02 mg/dL (ref 0.76–1.27)
Calcium: 9.9 mg/dL (ref 8.6–10.2)
GFR calc Af Amer: 86 mL/min/{1.73_m2} (ref >59)
GFR calc non Af Amer: 75 mL/min/{1.73_m2} (ref >59)
GLUCOSE: 80 mg/dL (ref 65–99)
Potassium: 4.6 mmol/L (ref 3.5–5.2)
SODIUM: 144 mmol/L (ref 134–144)

## 2018-02-21 LAB — TSH: TSH: 0.815 u[IU]/mL (ref 0.450–4.500)

## 2018-06-02 ENCOUNTER — Other Ambulatory Visit: Payer: Self-pay | Admitting: Family Medicine

## 2018-06-08 ENCOUNTER — Telehealth: Payer: Self-pay

## 2018-06-08 NOTE — Telephone Encounter (Signed)
Pt called in wanting to reschedule wellness visit. He states he does not want to do a virtual visit.

## 2018-06-12 ENCOUNTER — Telehealth: Payer: Self-pay

## 2018-06-12 DIAGNOSIS — E039 Hypothyroidism, unspecified: Secondary | ICD-10-CM

## 2018-06-12 MED ORDER — LEVOTHYROXINE SODIUM 137 MCG PO TABS: 137.0000 ug | ORAL_TABLET | Freq: Every day | ORAL | 4 refills | Status: DC

## 2018-06-12 MED ORDER — CYANOCOBALAMIN ER 1000 MCG PO TBCR: 1000.0000 ug | EXTENDED_RELEASE_TABLET | Freq: Every day | ORAL | 4 refills | Status: DC

## 2018-06-12 NOTE — Addendum Note (Signed)
Addended by: [name] A on: 06/12/2018 01:43 PM   Modules accepted: Orders

## 2018-06-12 NOTE — Telephone Encounter (Signed)
Called patient to change medicare wellness visit to virtual/telephonic visit. Patient agreed to telephonic visit- unable to do video.   Patient requested med refill on levothyroxine and vitamin b12 to [address]. States he spoke with them a week or so ago and hasnt heard anything back about it being refilled.  Will route to [place] for refill.

## 2018-06-28 ENCOUNTER — Ambulatory Visit (INDEPENDENT_AMBULATORY_CARE_PROVIDER_SITE_OTHER): Payer: Medicare Other

## 2018-06-28 DIAGNOSIS — Z0000 Encounter for general adult medical examination without abnormal findings: Secondary | ICD-10-CM

## 2018-06-28 NOTE — Patient Instructions (Signed)
Joshua [name] , Thank you for taking time to come for your Medicare Wellness Visit. I appreciate your ongoing commitment to your health goals. Please review the following plan we discussed and let me know if I can assist you in the future.   Screening recommendations/referrals: Colonoscopy: completed 04/17/2014 Recommended yearly ophthalmology/optometry visit for glaucoma screening and checkup Recommended yearly dental visit for hygiene and checkup  Vaccinations: Influenza vaccine: up to date  Pneumococcal vaccine: up to date  Tdap vaccine: up to date Shingles vaccine: shingrix eligible, check with your insurance company for coverage     Advanced directives: Advance directive discussed with you today. I have provided a copy for you to complete at home and have notarized. Once this is complete please bring a copy in to our office so we can scan it into your chart.  Conditions/risks identified: none   Next appointment: Follow up in one year for your annual wellness exam.   Preventive Care 65 Years and Older, Male Preventive care refers to lifestyle choices and visits with your health care provider that can promote health and wellness. What does preventive care include?  A yearly physical exam. This is also called an annual well check.  Dental exams once or twice a year.  Routine eye exams. Ask your health care provider how often you should have your eyes checked.  Personal lifestyle choices, including:  Daily care of your teeth and gums.  Regular physical activity.  Eating a healthy diet.  Avoiding tobacco and drug use.  Limiting alcohol use.  Practicing safe sex.  Taking low doses of aspirin every day.  Taking vitamin and mineral supplements as recommended by your health care provider. What happens during an annual well check? The services and screenings done by your health care provider during your annual well check will depend on your age, overall health, lifestyle risk  factors, and family history of disease. Counseling  Your health care provider may ask you questions about your:  Alcohol use.  Tobacco use.  Drug use.  Emotional well-being.  Home and relationship well-being.  Sexual activity.  Eating habits.  History of falls.  Memory and ability to understand (cognition).  Work and work Statistician. Screening  You may have the following tests or measurements:  Height, weight, and BMI.  Blood pressure.  Lipid and cholesterol levels. These may be checked every 5 years, or more frequently if you are over 44 years old.  Skin check.  Lung cancer screening. You may have this screening every year starting at age 25 if you have a 30-pack-year history of smoking and currently smoke or have quit within the past 15 years.  Fecal occult blood test (FOBT) of the stool. You may have this test every year starting at age 44.  Flexible sigmoidoscopy or colonoscopy. You may have a sigmoidoscopy every 5 years or a colonoscopy every 10 years starting at age 1.  Prostate cancer screening. Recommendations will vary depending on your family history and other risks.  Hepatitis C blood test.  Hepatitis B blood test.  Sexually transmitted disease (STD) testing.  Diabetes screening. This is done by checking your blood sugar (glucose) after you have not eaten for a while (fasting). You may have this done every 1-3 years.  Abdominal aortic aneurysm (AAA) screening. You may need this if you are a current or former smoker.  Osteoporosis. You may be screened starting at age 28 if you are at high risk. Talk with your health care provider about your  test results, treatment options, and if necessary, the need for more tests. Vaccines  Your health care provider may recommend certain vaccines, such as:  Influenza vaccine. This is recommended every year.  Tetanus, diphtheria, and acellular pertussis (Tdap, Td) vaccine. You may need a Td booster every 10  years.  Zoster vaccine. You may need this after age 33.  Pneumococcal 13-valent conjugate (PCV13) vaccine. One dose is recommended after age 60.  Pneumococcal polysaccharide (PPSV23) vaccine. One dose is recommended after age 61. Talk to your health care provider about which screenings and vaccines you need and how often you need them. This information is not intended to replace advice given to you by your health care provider. Make sure you discuss any questions you have with your health care provider. Document Released: 02/14/2015 Document Revised: 10/08/2015 Document Reviewed: 11/19/2014 Elsevier Interactive Patient Education  2017 [place] Prevention in the Home Falls can cause injuries. They can happen to people of all ages. There are many things you can do to make your home safe and to help prevent falls. What can I do on the outside of my home?  Regularly fix the edges of walkways and driveways and fix any cracks.  Remove anything that might make you trip as you walk through a door, such as a raised step or threshold.  Trim any bushes or trees on the path to your home.  Use bright outdoor lighting.  Clear any walking paths of anything that might make someone trip, such as rocks or tools.  Regularly check to see if handrails are loose or broken. Make sure that both sides of any steps have handrails.  Any raised decks and porches should have guardrails on the edges.  Have any leaves, snow, or ice cleared regularly.  Use sand or salt on walking paths during winter.  Clean up any spills in your garage right away. This includes oil or grease spills. What can I do in the bathroom?  Use night lights.  Install grab bars by the toilet and in the tub and shower. Do not use towel bars as grab bars.  Use non-skid mats or decals in the tub or shower.  If you need to sit down in the shower, use a plastic, non-slip stool.  Keep the floor dry. Clean up any water that  spills on the floor as soon as it happens.  Remove soap buildup in the tub or shower regularly.  Attach bath mats securely with double-sided non-slip rug tape.  Do not have throw rugs and other things on the floor that can make you trip. What can I do in the bedroom?  Use night lights.  Make sure that you have a light by your bed that is easy to reach.  Do not use any sheets or blankets that are too big for your bed. They should not hang down onto the floor.  Have a firm chair that has side arms. You can use this for support while you get dressed.  Do not have throw rugs and other things on the floor that can make you trip. What can I do in the kitchen?  Clean up any spills right away.  Avoid walking on wet floors.  Keep items that you use a lot in easy-to-reach places.  If you need to reach something above you, use a strong step stool that has a grab bar.  Keep electrical cords out of the way.  Do not use floor polish or wax that  makes floors slippery. If you must use wax, use non-skid floor wax.  Do not have throw rugs and other things on the floor that can make you trip. What can I do with my stairs?  Do not leave any items on the stairs.  Make sure that there are handrails on both sides of the stairs and use them. Fix handrails that are broken or loose. Make sure that handrails are as long as the stairways.  Check any carpeting to make sure that it is firmly attached to the stairs. Fix any carpet that is loose or worn.  Avoid having throw rugs at the top or bottom of the stairs. If you do have throw rugs, attach them to the floor with carpet tape.  Make sure that you have a light switch at the top of the stairs and the bottom of the stairs. If you do not have them, ask someone to add them for you. What else can I do to help prevent falls?  Wear shoes that:  Do not have high heels.  Have rubber bottoms.  Are comfortable and fit you well.  Are closed at the  toe. Do not wear sandals.  If you use a stepladder:  Make sure that it is fully opened. Do not climb a closed stepladder.  Make sure that both sides of the stepladder are locked into place.  Ask someone to hold it for you, if possible.  Clearly mark and make sure that you can see:  Any grab bars or handrails.  First and last steps.  Where the edge of each step is.  Use tools that help you move around (mobility aids) if they are needed. These include:  Canes.  Walkers.  Scooters.  Crutches.  Turn on the lights when you go into a dark area. Replace any light bulbs as soon as they burn out.  Set up your furniture so you have a clear path. Avoid moving your furniture around.  If any of your floors are uneven, fix them.  If there are any pets around you, be aware of where they are.  Review your medicines with your doctor. Some medicines can make you feel dizzy. This can increase your chance of falling. Ask your doctor what other things that you can do to help prevent falls. This information is not intended to replace advice given to you by your health care provider. Make sure you discuss any questions you have with your health care provider. Document Released: 11/14/2008 Document Revised: 06/26/2015 Document Reviewed: 02/22/2014 Elsevier Interactive Patient Education  2017 [employer].

## 2018-06-28 NOTE — Progress Notes (Signed)
Subjective:   Joshua [name] is a 68 y.o. male who presents for Medicare Annual/Subsequent preventive examination.  This visit is being conducted via phone call  - after an attmept to do on video chat - due to the COVID-19 pandemic. This patient has given me verbal consent via phone to conduct this visit, patient states they are participating from their home address. Some vital signs may be absent or patient reported.   Patient identification: identified by name, DOB, and current address.    Review of Systems:   Cardiac Risk Factors include: male gender     Objective:    Vitals: There were no vitals taken for this visit.  There is no height or weight on file to calculate BMI.  Advanced Directives 06/28/2018 06/23/2017  Does Patient Have a Medical Advance Directive? No No  Does patient want to make changes to medical advance directive? - Yes (MAU/Ambulatory/Procedural Areas - Information given)  Would patient like information on creating a medical advance directive? Yes (MAU/Ambulatory/Procedural Areas - Information given) -    Tobacco Social History   Tobacco Use  Smoking Status Never Smoker  Smokeless Tobacco Never Used     Counseling given: Not Answered   Clinical Intake:  Pre-visit preparation completed: Yes  Pain : No/denies pain     Nutritional Risks: None Diabetes: No  How often do you need to have someone help you when you read instructions, pamphlets, or other written materials from your doctor or pharmacy?: 1 - Never What is the last grade level you completed in school?: high school   Interpreter Needed?: No  Information entered by :: Arianna Delsanto,LPN   Past Medical History:  Diagnosis Date  . Allergy   . Amnesia 06/13/2014  . B12 deficiency 06/13/2014  . Benign essential HTN 10/30/2014  . BPH (benign prostatic hyperplasia)   . BPH with obstruction/lower urinary tract symptoms 05/15/2015  . Hypogonadism in male   . Hypothyroidism 10/30/2014   Past  Surgical History:  Procedure Laterality Date  . COLONOSCOPY  March 2016  . ESOPHAGOGASTRODUODENOSCOPY ENDOSCOPY  March 2016  . EYE SURGERY Bilateral    cataracts  . KNEE SURGERY Right   . SHOULDER SURGERY Left   . TONSILLECTOMY     Family History  Problem Relation Age of Onset  . Breast cancer Mother   . COPD Father   . Heart disease Father   . Prostate cancer Father    Social History   Socioeconomic History  . Marital status: Married    Spouse name: Not on file  . Number of children: Not on file  . Years of education: Not on file  . Highest education level: High school graduate  Occupational History  . Not on file  Social Needs  . Financial resource strain: Not hard at all  . Food insecurity:    Worry: Never true    Inability: Never true  . Transportation needs:    Medical: No    Non-medical: No  Tobacco Use  . Smoking status: Never Smoker  . Smokeless tobacco: Never Used  Substance and Sexual Activity  . Alcohol use: Yes    Alcohol/week: 0.0 standard drinks    Comment: Rarely  . Drug use: No  . Sexual activity: Not on file  Lifestyle  . Physical activity:    Days per week: 0 days    Minutes per session: 0 min  . Stress: Not at all  Relationships  . Social connections:    Talks  on phone: More than three times a week    Gets together: More than three times a week    Attends religious service: Never    Active member of club or organization: No    Attends meetings of clubs or organizations: Never    Relationship status: Married  Other Topics Concern  . Not on file  Social History Narrative  . Not on file    Outpatient Encounter Medications as of 06/28/2018  Medication Sig  . Cyanocobalamin (RA VITAMIN B-12 TR) 1000 MCG TBCR Take 1 tablet (1,000 mcg total) by mouth daily.  Marland Kitchen levothyroxine (SYNTHROID) 137 MCG tablet Take 1 tablet (137 mcg total) by mouth daily before breakfast.  . losartan (COZAAR) 100 MG tablet TAKE 1 TABLET EVERY DAY   No  facility-administered encounter medications on file as of 06/28/2018.     Activities of Daily Living In your present state of health, do you have any difficulty performing the following activities: 06/28/2018  Hearing? Y  Comment hearing aids   Vision? N  Comment reading glasses   Difficulty concentrating or making decisions? N  Walking or climbing stairs? N  Dressing or bathing? N  Doing errands, shopping? N  Preparing Food and eating ? N  Using the Toilet? N  In the past six months, have you accidently leaked urine? N  Do you have problems with loss of bowel control? N  Managing your Medications? N  Managing your Finances? N  Housekeeping or managing your Housekeeping? N  Some recent data might be hidden    Patient Care Team: Guadalupe Maple, MD as PCP - General (Family Medicine) Manya Silvas, MD (Gastroenterology) Dawayne Patricia, MD as Referring Physician (Orthopedic Surgery) Carloyn Manner, MD as Referring Physician (Otolaryngology)   Assessment:   This is a routine wellness examination for Lysle.  Exercise Activities and Dietary recommendations Current Exercise Habits: The patient does not participate in regular exercise at present, Exercise limited by: None identified  Goals    . DIET - INCREASE WATER INTAKE     Recommend drinking at least 6-8 glasses of water a day        Fall Risk: Fall Risk  06/28/2018 02/20/2018 06/23/2017 06/10/2016 03/31/2015  Falls in the past year? 0 0 No No No  Follow up - Falls evaluation completed - - -    FALL RISK PREVENTION PERTAINING TO THE HOME:  Any stairs in or around the home? No  If so, are there any without handrails? n/a  Home free of loose throw rugs in walkways, pet beds, electrical cords, etc? Yes  Adequate lighting in your home to reduce risk of falls? Yes   ASSISTIVE DEVICES UTILIZED TO PREVENT FALLS:  Life alert? No  Use of a cane, walker or w/c? No  Grab bars in the bathroom? Yes  has but doesn't use  them  Shower chair or bench in shower? Yes  has but doesn't use it  Elevated toilet seat or a handicapped toilet? Yes   TIMED UP AND GO:  Unable to perform   Depression Screen PHQ 2/9 Scores 06/28/2018 06/23/2017 06/10/2016 03/31/2015  PHQ - 2 Score 0 0 0 0    Cognitive Function     6CIT Screen 06/28/2018 06/23/2017  What Year? 0 points 0 points  What month? 0 points 0 points  What time? 0 points 0 points  Count back from 20 0 points 0 points  Months in reverse 0 points 0 points  Repeat phrase 0 points  0 points  Total Score 0 0    Immunization History  Administered Date(s) Administered  . Influenza, High Dose Seasonal PF 10/30/2014, 11/06/2015, 11/08/2016, 10/31/2017  . Influenza,inj,Quad PF,6+ Mos 10/30/2014  . Pneumococcal Conjugate-13 10/29/2013  . Pneumococcal Polysaccharide-23 06/23/2017  . Td 10/11/2007, 02/20/2018  . Zoster 02/24/2011    Qualifies for Shingles Vaccine? Yes  Zostavax completed 02/24/2011. Due for Shingrix. Education has been provided regarding the importance of this vaccine. Pt has been advised to call insurance company to determine out of pocket expense. Advised may also receive vaccine at local pharmacy or Health Dept. Verbalized acceptance and understanding.  Tdap: up to date   Flu Vaccine: up to date   Pneumococcal Vaccine: up to date   Screening Tests Health Maintenance  Topic Date Due  . INFLUENZA VACCINE  09/02/2018  . COLONOSCOPY  04/16/2024  . TETANUS/TDAP  02/21/2028  . Hepatitis C Screening  Completed  . PNA vac Low Risk Adult  Completed   Cancer Screenings:  Colorectal Screening: Completed 04/17/2014. Repeat every 10 years  Lung Cancer Screening: (Low Dose CT Chest recommended if Age 74-80 years, 30 pack-year currently smoking OR have quit w/in 15years.) does not qualify.    Additional Screening:  Hepatitis C Screening: does qualify; Completed 06/23/2017  Vision Screening: Recommended annual ophthalmology exams for early  detection of glaucoma and other disorders of the eye. Is the patient up to date with their annual eye exam?  Yes  Who is the provider or what is the name of the office in which the pt attends annual eye exams? [facility]    Dental Screening: Recommended annual dental exams for proper oral hygiene  Community Resource Referral:  CRR required this visit?  No        Plan:  I have personally reviewed and addressed the Medicare Annual Wellness questionnaire and have noted the following in the patient's chart:  A. Medical and social history B. Use of alcohol, tobacco or illicit drugs  C. Current medications and supplements D. Functional ability and status E.  Nutritional status F.  Physical activity G. Advance directives H. List of other physicians I.  Hospitalizations, surgeries, and ER visits in previous 12 months J.  [place] such as hearing and vision if needed, cognitive and depression L. Referrals and appointments   In addition, I have reviewed and discussed with patient certain preventive protocols, quality metrics, and best practice recommendations. A written personalized care plan for preventive services as well as general preventive health recommendations were provided to patient.   Signed,   Bevelyn Ngo, LPN  4/70/9628 Nurse Health Advisor   Nurse Notes: none

## 2018-07-24 DIAGNOSIS — I10 Essential (primary) hypertension: Secondary | ICD-10-CM | POA: Diagnosis not present

## 2018-07-24 DIAGNOSIS — G4733 Obstructive sleep apnea (adult) (pediatric): Secondary | ICD-10-CM | POA: Diagnosis not present

## 2018-07-24 DIAGNOSIS — G2581 Restless legs syndrome: Secondary | ICD-10-CM | POA: Diagnosis not present

## 2018-07-24 DIAGNOSIS — E669 Obesity, unspecified: Secondary | ICD-10-CM | POA: Diagnosis not present

## 2018-08-01 DIAGNOSIS — M9905 Segmental and somatic dysfunction of pelvic region: Secondary | ICD-10-CM | POA: Diagnosis not present

## 2018-08-01 DIAGNOSIS — M5136 Other intervertebral disc degeneration, lumbar region: Secondary | ICD-10-CM | POA: Diagnosis not present

## 2018-08-01 DIAGNOSIS — M9903 Segmental and somatic dysfunction of lumbar region: Secondary | ICD-10-CM | POA: Diagnosis not present

## 2018-08-01 DIAGNOSIS — M5432 Sciatica, left side: Secondary | ICD-10-CM | POA: Diagnosis not present

## 2018-08-25 DIAGNOSIS — M9905 Segmental and somatic dysfunction of pelvic region: Secondary | ICD-10-CM | POA: Diagnosis not present

## 2018-08-25 DIAGNOSIS — M9903 Segmental and somatic dysfunction of lumbar region: Secondary | ICD-10-CM | POA: Diagnosis not present

## 2018-08-25 DIAGNOSIS — M5432 Sciatica, left side: Secondary | ICD-10-CM | POA: Diagnosis not present

## 2018-08-25 DIAGNOSIS — M5136 Other intervertebral disc degeneration, lumbar region: Secondary | ICD-10-CM | POA: Diagnosis not present

## 2018-08-28 ENCOUNTER — Other Ambulatory Visit: Payer: Self-pay | Admitting: Family Medicine

## 2018-08-28 DIAGNOSIS — E039 Hypothyroidism, unspecified: Secondary | ICD-10-CM

## 2018-08-29 DIAGNOSIS — M9905 Segmental and somatic dysfunction of pelvic region: Secondary | ICD-10-CM | POA: Diagnosis not present

## 2018-08-29 DIAGNOSIS — M5432 Sciatica, left side: Secondary | ICD-10-CM | POA: Diagnosis not present

## 2018-08-29 DIAGNOSIS — M9903 Segmental and somatic dysfunction of lumbar region: Secondary | ICD-10-CM | POA: Diagnosis not present

## 2018-08-29 DIAGNOSIS — M5136 Other intervertebral disc degeneration, lumbar region: Secondary | ICD-10-CM | POA: Diagnosis not present

## 2018-09-05 DIAGNOSIS — M5432 Sciatica, left side: Secondary | ICD-10-CM | POA: Diagnosis not present

## 2018-09-05 DIAGNOSIS — M9905 Segmental and somatic dysfunction of pelvic region: Secondary | ICD-10-CM | POA: Diagnosis not present

## 2018-09-05 DIAGNOSIS — M5136 Other intervertebral disc degeneration, lumbar region: Secondary | ICD-10-CM | POA: Diagnosis not present

## 2018-09-05 DIAGNOSIS — M9903 Segmental and somatic dysfunction of lumbar region: Secondary | ICD-10-CM | POA: Diagnosis not present

## 2018-09-27 DIAGNOSIS — M5432 Sciatica, left side: Secondary | ICD-10-CM | POA: Diagnosis not present

## 2018-09-27 DIAGNOSIS — M9905 Segmental and somatic dysfunction of pelvic region: Secondary | ICD-10-CM | POA: Diagnosis not present

## 2018-09-27 DIAGNOSIS — M5136 Other intervertebral disc degeneration, lumbar region: Secondary | ICD-10-CM | POA: Diagnosis not present

## 2018-09-27 DIAGNOSIS — M9903 Segmental and somatic dysfunction of lumbar region: Secondary | ICD-10-CM | POA: Diagnosis not present

## 2018-09-29 DIAGNOSIS — M5432 Sciatica, left side: Secondary | ICD-10-CM | POA: Diagnosis not present

## 2018-09-29 DIAGNOSIS — M9905 Segmental and somatic dysfunction of pelvic region: Secondary | ICD-10-CM | POA: Diagnosis not present

## 2018-09-29 DIAGNOSIS — M9903 Segmental and somatic dysfunction of lumbar region: Secondary | ICD-10-CM | POA: Diagnosis not present

## 2018-09-29 DIAGNOSIS — M5136 Other intervertebral disc degeneration, lumbar region: Secondary | ICD-10-CM | POA: Diagnosis not present

## 2018-10-30 DIAGNOSIS — G4733 Obstructive sleep apnea (adult) (pediatric): Secondary | ICD-10-CM | POA: Diagnosis not present

## 2018-10-30 DIAGNOSIS — G2581 Restless legs syndrome: Secondary | ICD-10-CM | POA: Diagnosis not present

## 2018-10-30 DIAGNOSIS — G4761 Periodic limb movement disorder: Secondary | ICD-10-CM | POA: Diagnosis not present

## 2018-11-01 DIAGNOSIS — Z23 Encounter for immunization: Secondary | ICD-10-CM | POA: Diagnosis not present

## 2018-12-18 DIAGNOSIS — M9905 Segmental and somatic dysfunction of pelvic region: Secondary | ICD-10-CM | POA: Diagnosis not present

## 2018-12-18 DIAGNOSIS — M5432 Sciatica, left side: Secondary | ICD-10-CM | POA: Diagnosis not present

## 2018-12-18 DIAGNOSIS — M5136 Other intervertebral disc degeneration, lumbar region: Secondary | ICD-10-CM | POA: Diagnosis not present

## 2018-12-18 DIAGNOSIS — M9903 Segmental and somatic dysfunction of lumbar region: Secondary | ICD-10-CM | POA: Diagnosis not present

## 2018-12-19 DIAGNOSIS — M5136 Other intervertebral disc degeneration, lumbar region: Secondary | ICD-10-CM | POA: Diagnosis not present

## 2018-12-19 DIAGNOSIS — M9905 Segmental and somatic dysfunction of pelvic region: Secondary | ICD-10-CM | POA: Diagnosis not present

## 2018-12-19 DIAGNOSIS — M5432 Sciatica, left side: Secondary | ICD-10-CM | POA: Diagnosis not present

## 2018-12-19 DIAGNOSIS — M9903 Segmental and somatic dysfunction of lumbar region: Secondary | ICD-10-CM | POA: Diagnosis not present

## 2018-12-20 ENCOUNTER — Encounter: Payer: Self-pay | Admitting: Family Medicine

## 2018-12-20 ENCOUNTER — Ambulatory Visit (INDEPENDENT_AMBULATORY_CARE_PROVIDER_SITE_OTHER): Payer: Medicare Other | Admitting: Family Medicine

## 2018-12-20 ENCOUNTER — Other Ambulatory Visit: Payer: Self-pay

## 2018-12-20 VITALS — BP 131/88 | HR 75 | Temp 98.2°F | Ht 67.0 in | Wt 218.0 lb

## 2018-12-20 DIAGNOSIS — M5136 Other intervertebral disc degeneration, lumbar region: Secondary | ICD-10-CM | POA: Diagnosis not present

## 2018-12-20 DIAGNOSIS — M9903 Segmental and somatic dysfunction of lumbar region: Secondary | ICD-10-CM | POA: Diagnosis not present

## 2018-12-20 DIAGNOSIS — M5432 Sciatica, left side: Secondary | ICD-10-CM | POA: Diagnosis not present

## 2018-12-20 DIAGNOSIS — M9905 Segmental and somatic dysfunction of pelvic region: Secondary | ICD-10-CM | POA: Diagnosis not present

## 2018-12-20 DIAGNOSIS — M5441 Lumbago with sciatica, right side: Secondary | ICD-10-CM | POA: Diagnosis not present

## 2018-12-20 MED ORDER — PREDNISONE 10 MG PO TABS: ORAL_TABLET | ORAL | 0 refills | Status: DC

## 2018-12-20 MED ORDER — CYCLOBENZAPRINE HCL 5 MG PO TABS: 5.0000 mg | ORAL_TABLET | Freq: Three times a day (TID) | ORAL | 0 refills | Status: DC | PRN

## 2018-12-20 MED ORDER — KETOROLAC TROMETHAMINE 30 MG/ML IJ SOLN: 30.0000 mg | Freq: Once | INTRAMUSCULAR | Status: DC

## 2018-12-20 NOTE — Progress Notes (Signed)
BP 131/88   Pulse 75   Temp 98.2 F (36.8 C) (Oral)   Ht 5\' 7"  (1.702 m)   Wt 218 lb (98.9 kg)   SpO2 99%   BMI 34.14 kg/m    Subjective:    Patient ID: Joshua [name], male    DOB: [birth year], 68 y.o.   MRN: [id number]  HPI: LEOPOLD ESSINGER is a 68 y.o. male  Chief Complaint  Patient presents with  . Back Pain    lower x a week. has tried alive OTC   Patient presenting with over a week of lower back pain with some sciatica. Has been out hunting and doing tons of yardwork and feels this has exacerbated his low back pain. Left sided and radiating down right leg initially, sharp constant but worse with movement. Trying ice, aleve OTC and chiropractor with minimal relief. Right great toe going numb since all of this flared up. Trying rest, massage, OTC pain relievers without much relief.   Relevant past medical, surgical, family and social history reviewed and updated as indicated. Interim medical history since our last visit reviewed. Allergies and medications reviewed and updated.  Review of Systems  Per HPI unless specifically indicated above     Objective:    BP 131/88   Pulse 75   Temp 98.2 F (36.8 C) (Oral)   Ht 5\' 7"  (1.702 m)   Wt 218 lb (98.9 kg)   SpO2 99%   BMI 34.14 kg/m   Wt Readings from Last 3 Encounters:  12/20/18 218 lb (98.9 kg)  02/20/18 218 lb (98.9 kg)  11/03/17 217 lb 8 oz (98.7 kg)    Physical Exam Vitals signs and nursing note reviewed.  Constitutional:      Appearance: Normal appearance.  HENT:     Head: Atraumatic.  Eyes:     Extraocular Movements: Extraocular movements intact.     Conjunctiva/sclera: Conjunctivae normal.  Neck:     Musculoskeletal: Normal range of motion and neck supple.  Cardiovascular:     Rate and Rhythm: Normal rate and regular rhythm.  Pulmonary:     Effort: Pulmonary effort is normal.     Breath sounds: Normal breath sounds.  Musculoskeletal: Normal range of motion.        General: Tenderness (b/l  lumbar paraspinal muscles ttp) present.     Comments: - SLR b/l  Skin:    General: Skin is warm and dry.  Neurological:     General: No focal deficit present.     Mental Status: He is oriented to person, place, and time.  Psychiatric:        Mood and Affect: Mood normal.        Thought Content: Thought content normal.        Judgment: Judgment normal.     Results for orders placed or performed in visit on [id number]  Basic metabolic panel  Result Value Ref Range   Glucose 80 65 - 99 mg/dL   BUN 18 8 - 27 mg/dL   Creatinine, Ser 1.02 0.76 - 1.27 mg/dL   GFR calc non Af Amer 75 >59 mL/min/1.73   GFR calc Af Amer 86 >59 mL/min/1.73   BUN/Creatinine Ratio 18 10 - 24   Sodium 144 134 - 144 mmol/L   Potassium 4.6 3.5 - 5.2 mmol/L   Chloride 99 96 - 106 mmol/L   CO2 24 20 - 29 mmol/L   Calcium 9.9 8.6 - 10.2 mg/dL  CBC w/Diff  Result  Value Ref Range   WBC 7.3 3.4 - 10.8 x10E3/uL   RBC 4.95 4.14 - 5.80 x10E6/uL   Hemoglobin 15.7 13.0 - 17.7 g/dL   Hematocrit 45.9 37.5 - 51.0 %   MCV 93 79 - 97 fL   MCH 31.7 26.6 - 33.0 pg   MCHC 34.2 31.5 - 35.7 g/dL   RDW 13.0 11.6 - 15.4 %   Platelets 178 150 - 450 x10E3/uL   Neutrophils 64 Not Estab. %   Lymphs 22 Not Estab. %   Monocytes 10 Not Estab. %   Eos 2 Not Estab. %   Basos 1 Not Estab. %   Neutrophils Absolute 4.7 1.4 - 7.0 x10E3/uL   Lymphocytes Absolute 1.6 0.7 - 3.1 x10E3/uL   Monocytes Absolute 0.7 0.1 - 0.9 x10E3/uL   EOS (ABSOLUTE) 0.2 0.0 - 0.4 x10E3/uL   Basophils Absolute 0.0 0.0 - 0.2 x10E3/uL   Immature Granulocytes 1 Not Estab. %   Immature Grans (Abs) 0.1 0.0 - 0.1 x10E3/uL  TSH  Result Value Ref Range   TSH 0.815 0.450 - 4.500 uIU/mL      Assessment & Plan:   Problem List Items Addressed This Visit    None    Visit Diagnoses    Acute midline low back pain with right-sided sciatica    -  Primary   Tx with IM toradol, prednisone taper, flexeril prn. Rest, heat, stretches, massage recommended. F/u if not  improving   Relevant Medications   predniSONE (DELTASONE) 10 MG tablet   cyclobenzaprine (FLEXERIL) 5 MG tablet   ketorolac (TORADOL) injection 60 mg (Completed)       Follow up plan: Return if symptoms worsen or fail to improve.

## 2018-12-21 DIAGNOSIS — M5441 Lumbago with sciatica, right side: Secondary | ICD-10-CM

## 2018-12-21 MED ORDER — KETOROLAC TROMETHAMINE 60 MG/2ML IM SOLN: 30.0000 mg | Freq: Once | INTRAMUSCULAR | Status: DC

## 2018-12-21 MED ORDER — KETOROLAC TROMETHAMINE 60 MG/2ML IM SOLN
60.0000 mg | Freq: Once | INTRAMUSCULAR | Status: AC
  Administered 2018-12-21: 60 mg via INTRAMUSCULAR

## 2018-12-22 DIAGNOSIS — M9905 Segmental and somatic dysfunction of pelvic region: Secondary | ICD-10-CM | POA: Diagnosis not present

## 2018-12-22 DIAGNOSIS — M9903 Segmental and somatic dysfunction of lumbar region: Secondary | ICD-10-CM | POA: Diagnosis not present

## 2018-12-22 DIAGNOSIS — M5432 Sciatica, left side: Secondary | ICD-10-CM | POA: Diagnosis not present

## 2018-12-22 DIAGNOSIS — M5136 Other intervertebral disc degeneration, lumbar region: Secondary | ICD-10-CM | POA: Diagnosis not present

## 2018-12-25 DIAGNOSIS — M9905 Segmental and somatic dysfunction of pelvic region: Secondary | ICD-10-CM | POA: Diagnosis not present

## 2018-12-25 DIAGNOSIS — M5432 Sciatica, left side: Secondary | ICD-10-CM | POA: Diagnosis not present

## 2018-12-25 DIAGNOSIS — M9903 Segmental and somatic dysfunction of lumbar region: Secondary | ICD-10-CM | POA: Diagnosis not present

## 2018-12-25 DIAGNOSIS — M5136 Other intervertebral disc degeneration, lumbar region: Secondary | ICD-10-CM | POA: Diagnosis not present

## 2018-12-26 DIAGNOSIS — M9903 Segmental and somatic dysfunction of lumbar region: Secondary | ICD-10-CM | POA: Diagnosis not present

## 2018-12-26 DIAGNOSIS — M9905 Segmental and somatic dysfunction of pelvic region: Secondary | ICD-10-CM | POA: Diagnosis not present

## 2018-12-26 DIAGNOSIS — M5136 Other intervertebral disc degeneration, lumbar region: Secondary | ICD-10-CM | POA: Diagnosis not present

## 2018-12-26 DIAGNOSIS — M5432 Sciatica, left side: Secondary | ICD-10-CM | POA: Diagnosis not present

## 2018-12-27 DIAGNOSIS — M9903 Segmental and somatic dysfunction of lumbar region: Secondary | ICD-10-CM | POA: Diagnosis not present

## 2018-12-27 DIAGNOSIS — M5432 Sciatica, left side: Secondary | ICD-10-CM | POA: Diagnosis not present

## 2018-12-27 DIAGNOSIS — M9905 Segmental and somatic dysfunction of pelvic region: Secondary | ICD-10-CM | POA: Diagnosis not present

## 2018-12-27 DIAGNOSIS — M5136 Other intervertebral disc degeneration, lumbar region: Secondary | ICD-10-CM | POA: Diagnosis not present

## 2019-01-01 DIAGNOSIS — M9905 Segmental and somatic dysfunction of pelvic region: Secondary | ICD-10-CM | POA: Diagnosis not present

## 2019-01-01 DIAGNOSIS — M9903 Segmental and somatic dysfunction of lumbar region: Secondary | ICD-10-CM | POA: Diagnosis not present

## 2019-01-01 DIAGNOSIS — M5432 Sciatica, left side: Secondary | ICD-10-CM | POA: Diagnosis not present

## 2019-01-01 DIAGNOSIS — M5136 Other intervertebral disc degeneration, lumbar region: Secondary | ICD-10-CM | POA: Diagnosis not present

## 2019-01-03 DIAGNOSIS — M9905 Segmental and somatic dysfunction of pelvic region: Secondary | ICD-10-CM | POA: Diagnosis not present

## 2019-01-03 DIAGNOSIS — M5432 Sciatica, left side: Secondary | ICD-10-CM | POA: Diagnosis not present

## 2019-01-03 DIAGNOSIS — M5136 Other intervertebral disc degeneration, lumbar region: Secondary | ICD-10-CM | POA: Diagnosis not present

## 2019-01-03 DIAGNOSIS — M9903 Segmental and somatic dysfunction of lumbar region: Secondary | ICD-10-CM | POA: Diagnosis not present

## 2019-01-08 DIAGNOSIS — I10 Essential (primary) hypertension: Secondary | ICD-10-CM | POA: Diagnosis not present

## 2019-01-08 DIAGNOSIS — G2581 Restless legs syndrome: Secondary | ICD-10-CM | POA: Diagnosis not present

## 2019-01-08 DIAGNOSIS — G4733 Obstructive sleep apnea (adult) (pediatric): Secondary | ICD-10-CM | POA: Diagnosis not present

## 2019-01-08 DIAGNOSIS — E669 Obesity, unspecified: Secondary | ICD-10-CM | POA: Diagnosis not present

## 2019-01-09 DIAGNOSIS — M9903 Segmental and somatic dysfunction of lumbar region: Secondary | ICD-10-CM | POA: Diagnosis not present

## 2019-01-09 DIAGNOSIS — M5136 Other intervertebral disc degeneration, lumbar region: Secondary | ICD-10-CM | POA: Diagnosis not present

## 2019-01-09 DIAGNOSIS — M9905 Segmental and somatic dysfunction of pelvic region: Secondary | ICD-10-CM | POA: Diagnosis not present

## 2019-01-09 DIAGNOSIS — M5432 Sciatica, left side: Secondary | ICD-10-CM | POA: Diagnosis not present

## 2019-01-16 DIAGNOSIS — M9903 Segmental and somatic dysfunction of lumbar region: Secondary | ICD-10-CM | POA: Diagnosis not present

## 2019-01-16 DIAGNOSIS — M9905 Segmental and somatic dysfunction of pelvic region: Secondary | ICD-10-CM | POA: Diagnosis not present

## 2019-01-16 DIAGNOSIS — M5136 Other intervertebral disc degeneration, lumbar region: Secondary | ICD-10-CM | POA: Diagnosis not present

## 2019-01-16 DIAGNOSIS — M5432 Sciatica, left side: Secondary | ICD-10-CM | POA: Diagnosis not present

## 2019-01-17 DIAGNOSIS — M9903 Segmental and somatic dysfunction of lumbar region: Secondary | ICD-10-CM | POA: Diagnosis not present

## 2019-01-17 DIAGNOSIS — M5136 Other intervertebral disc degeneration, lumbar region: Secondary | ICD-10-CM | POA: Diagnosis not present

## 2019-01-17 DIAGNOSIS — M5432 Sciatica, left side: Secondary | ICD-10-CM | POA: Diagnosis not present

## 2019-01-17 DIAGNOSIS — M9905 Segmental and somatic dysfunction of pelvic region: Secondary | ICD-10-CM | POA: Diagnosis not present

## 2019-01-19 DIAGNOSIS — M5136 Other intervertebral disc degeneration, lumbar region: Secondary | ICD-10-CM | POA: Diagnosis not present

## 2019-01-19 DIAGNOSIS — M9903 Segmental and somatic dysfunction of lumbar region: Secondary | ICD-10-CM | POA: Diagnosis not present

## 2019-01-19 DIAGNOSIS — M9905 Segmental and somatic dysfunction of pelvic region: Secondary | ICD-10-CM | POA: Diagnosis not present

## 2019-01-19 DIAGNOSIS — M5432 Sciatica, left side: Secondary | ICD-10-CM | POA: Diagnosis not present

## 2019-01-22 DIAGNOSIS — M5136 Other intervertebral disc degeneration, lumbar region: Secondary | ICD-10-CM | POA: Diagnosis not present

## 2019-01-22 DIAGNOSIS — M9905 Segmental and somatic dysfunction of pelvic region: Secondary | ICD-10-CM | POA: Diagnosis not present

## 2019-01-22 DIAGNOSIS — M9903 Segmental and somatic dysfunction of lumbar region: Secondary | ICD-10-CM | POA: Diagnosis not present

## 2019-01-22 DIAGNOSIS — M5432 Sciatica, left side: Secondary | ICD-10-CM | POA: Diagnosis not present

## 2019-01-23 DIAGNOSIS — M9905 Segmental and somatic dysfunction of pelvic region: Secondary | ICD-10-CM | POA: Diagnosis not present

## 2019-01-23 DIAGNOSIS — M9903 Segmental and somatic dysfunction of lumbar region: Secondary | ICD-10-CM | POA: Diagnosis not present

## 2019-01-23 DIAGNOSIS — M5136 Other intervertebral disc degeneration, lumbar region: Secondary | ICD-10-CM | POA: Diagnosis not present

## 2019-01-23 DIAGNOSIS — M5432 Sciatica, left side: Secondary | ICD-10-CM | POA: Diagnosis not present

## 2019-01-30 DIAGNOSIS — M9903 Segmental and somatic dysfunction of lumbar region: Secondary | ICD-10-CM | POA: Diagnosis not present

## 2019-01-30 DIAGNOSIS — M5136 Other intervertebral disc degeneration, lumbar region: Secondary | ICD-10-CM | POA: Diagnosis not present

## 2019-01-30 DIAGNOSIS — M5432 Sciatica, left side: Secondary | ICD-10-CM | POA: Diagnosis not present

## 2019-01-30 DIAGNOSIS — M9905 Segmental and somatic dysfunction of pelvic region: Secondary | ICD-10-CM | POA: Diagnosis not present

## 2019-03-06 DIAGNOSIS — M5136 Other intervertebral disc degeneration, lumbar region: Secondary | ICD-10-CM | POA: Diagnosis not present

## 2019-03-06 DIAGNOSIS — M5432 Sciatica, left side: Secondary | ICD-10-CM | POA: Diagnosis not present

## 2019-03-06 DIAGNOSIS — M9905 Segmental and somatic dysfunction of pelvic region: Secondary | ICD-10-CM | POA: Diagnosis not present

## 2019-03-06 DIAGNOSIS — M9903 Segmental and somatic dysfunction of lumbar region: Secondary | ICD-10-CM | POA: Diagnosis not present

## 2019-03-08 ENCOUNTER — Telehealth: Payer: Self-pay | Admitting: Family Medicine

## 2019-03-08 NOTE — Chronic Care Management (AMB) (Signed)
  Chronic Care Management   Note  03/08/2019 Name: Joshua [name] MRN: [id number] DOB: [birth year]  [name] is a 69 y.o. year old male who is a primary care patient of Crissman, Jeannette How, MD. I reached out to [name] by phone today in response to a referral sent by Mr. Westley Blass Rennie's health plan.     Mr. Fouche was given information about Chronic Care Management services today including:  1. CCM service includes personalized support from designated clinical staff supervised by his physician, including individualized plan of care and coordination with other care providers 2. 24/7 contact phone numbers for assistance for urgent and routine care needs. 3. Service will only be billed when office clinical staff spend 20 minutes or more in a month to coordinate care. 4. Only one practitioner may furnish and bill the service in a calendar month. 5. The patient may stop CCM services at any time (effective at the end of the month) by phone call to the office staff. 6. The patient will be responsible for cost sharing (co-pay) of up to 20% of the service fee (after annual deductible is met).  Patient did not agree to enrollment in care management services and does not wish to consider at this time.  Follow up plan: The patient has been provided with contact information for the care management team and has been advised to call with any health related questions or concerns.   Noreene Larsson, [place], [place], [place] [address] Direct Dial: [phone] [email or web address] Website: [email or web address]

## 2019-03-14 ENCOUNTER — Other Ambulatory Visit: Payer: Self-pay

## 2019-03-14 MED ORDER — LOSARTAN POTASSIUM 100 MG PO TABS: 100.0000 mg | ORAL_TABLET | Freq: Every day | ORAL | 1 refills | Status: DC

## 2019-03-14 NOTE — Telephone Encounter (Signed)
LOV: 12/20/2018 with Merrie Roof, PA-C.

## 2019-04-03 DIAGNOSIS — M5136 Other intervertebral disc degeneration, lumbar region: Secondary | ICD-10-CM | POA: Diagnosis not present

## 2019-04-03 DIAGNOSIS — M5432 Sciatica, left side: Secondary | ICD-10-CM | POA: Diagnosis not present

## 2019-04-03 DIAGNOSIS — M9905 Segmental and somatic dysfunction of pelvic region: Secondary | ICD-10-CM | POA: Diagnosis not present

## 2019-04-03 DIAGNOSIS — M9903 Segmental and somatic dysfunction of lumbar region: Secondary | ICD-10-CM | POA: Diagnosis not present

## 2019-04-20 DIAGNOSIS — M5136 Other intervertebral disc degeneration, lumbar region: Secondary | ICD-10-CM | POA: Diagnosis not present

## 2019-04-20 DIAGNOSIS — M9903 Segmental and somatic dysfunction of lumbar region: Secondary | ICD-10-CM | POA: Diagnosis not present

## 2019-04-20 DIAGNOSIS — M9905 Segmental and somatic dysfunction of pelvic region: Secondary | ICD-10-CM | POA: Diagnosis not present

## 2019-04-20 DIAGNOSIS — M5432 Sciatica, left side: Secondary | ICD-10-CM | POA: Diagnosis not present

## 2019-05-23 DIAGNOSIS — E669 Obesity, unspecified: Secondary | ICD-10-CM | POA: Diagnosis not present

## 2019-05-23 DIAGNOSIS — G2581 Restless legs syndrome: Secondary | ICD-10-CM | POA: Diagnosis not present

## 2019-05-23 DIAGNOSIS — G4733 Obstructive sleep apnea (adult) (pediatric): Secondary | ICD-10-CM | POA: Diagnosis not present

## 2019-07-09 ENCOUNTER — Ambulatory Visit (INDEPENDENT_AMBULATORY_CARE_PROVIDER_SITE_OTHER): Payer: Medicare Other

## 2019-07-09 ENCOUNTER — Other Ambulatory Visit: Payer: Self-pay

## 2019-07-09 ENCOUNTER — Ambulatory Visit (INDEPENDENT_AMBULATORY_CARE_PROVIDER_SITE_OTHER): Payer: Medicare Other | Admitting: Family Medicine

## 2019-07-09 VITALS — BP 127/88 | HR 76 | Temp 97.9°F | Resp 12 | Ht 67.0 in | Wt 218.0 lb

## 2019-07-09 DIAGNOSIS — E538 Deficiency of other specified B group vitamins: Secondary | ICD-10-CM | POA: Diagnosis not present

## 2019-07-09 DIAGNOSIS — E039 Hypothyroidism, unspecified: Secondary | ICD-10-CM

## 2019-07-09 DIAGNOSIS — Z0000 Encounter for general adult medical examination without abnormal findings: Secondary | ICD-10-CM | POA: Diagnosis not present

## 2019-07-09 DIAGNOSIS — I10 Essential (primary) hypertension: Secondary | ICD-10-CM

## 2019-07-09 DIAGNOSIS — N401 Enlarged prostate with lower urinary tract symptoms: Secondary | ICD-10-CM

## 2019-07-09 DIAGNOSIS — N138 Other obstructive and reflux uropathy: Secondary | ICD-10-CM | POA: Diagnosis not present

## 2019-07-09 DIAGNOSIS — R42 Dizziness and giddiness: Secondary | ICD-10-CM | POA: Diagnosis not present

## 2019-07-09 MED ORDER — LOSARTAN POTASSIUM 100 MG PO TABS: 100.0000 mg | ORAL_TABLET | Freq: Every day | ORAL | 1 refills | Status: DC

## 2019-07-09 MED ORDER — LEVOTHYROXINE SODIUM 137 MCG PO TABS: 137.0000 ug | ORAL_TABLET | Freq: Every day | ORAL | 4 refills | Status: DC

## 2019-07-09 NOTE — Patient Instructions (Signed)
[name] , Thank you for taking time to come for your Medicare Wellness Visit. I appreciate your ongoing commitment to your health goals. Please review the following plan we discussed and let me know if I can assist you in the future.   Screening recommendations/referrals: Colonoscopy: 04/21/2014 Recommended yearly ophthalmology/optometry visit for glaucoma screening and checkup Recommended yearly dental visit for hygiene and checkup  Vaccinations: Influenza vaccine: Due 10/2019 Pneumococcal vaccine: Up to date Tdap vaccine: Up to date Shingles vaccine:    Covid-19: Up to date  Advanced directives: None. Declined  Conditions/risks identified: See problem list  Next appointment: Follow up in one year for your annual wellness visit.                                                                                                                                                                                                                                                                                                                                                                                                                                                                                                 Preventive Care 65 Years and  Older, Male Preventive care refers to lifestyle choices and visits with your health care provider that can promote health and wellness. What does preventive care include?  A yearly physical exam. This is also called an annual well check.  Dental exams once or twice a year.  Routine eye exams. Ask your health care provider how often you should have your eyes checked.  Personal lifestyle choices, including:  Daily care of your teeth and gums.  Regular physical activity.  Eating a healthy diet.  Avoiding tobacco and drug use.  Limiting alcohol use.  Practicing safe sex.  Taking low doses of aspirin every day.  Taking vitamin and mineral  supplements as recommended by your health care provider. What happens during an annual well check? The services and screenings done by your health care provider during your annual well check will depend on your age, overall health, lifestyle risk factors, and family history of disease. Counseling  Your health care provider may ask you questions about your:  Alcohol use.  Tobacco use.  Drug use.  Emotional well-being.  Home and relationship well-being.  Sexual activity.  Eating habits.  History of falls.  Memory and ability to understand (cognition).  Work and work Statistician. Screening  You may have the following tests or measurements:  Height, weight, and BMI.  Blood pressure.  Lipid and cholesterol levels. These may be checked every 5 years, or more frequently if you are over 73 years old.  Skin check.  Lung cancer screening. You may have this screening every year starting at age 32 if you have a 30-pack-year history of smoking and currently smoke or have quit within the past 15 years.  Fecal occult blood test (FOBT) of the stool. You may have this test every year starting at age 51.  Flexible sigmoidoscopy or colonoscopy. You may have a sigmoidoscopy every 5 years or a colonoscopy every 10 years starting at age 84.  Prostate cancer screening. Recommendations will vary depending on your family history and other risks.  Hepatitis C blood test.  Hepatitis B blood test.  Sexually transmitted disease (STD) testing.  Diabetes screening. This is done by checking your blood sugar (glucose) after you have not eaten for a while (fasting). You may have this done every 1-3 years.  Abdominal aortic aneurysm (AAA) screening. You may need this if you are a current or former smoker.  Osteoporosis. You may be screened starting at age 71 if you are at high risk. Talk with your health care provider about your test results, treatment options, and if necessary, the need for more  tests. Vaccines  Your health care provider may recommend certain vaccines, such as:  Influenza vaccine. This is recommended every year.  Tetanus, diphtheria, and acellular pertussis (Tdap, Td) vaccine. You may need a Td booster every 10 years.  Zoster vaccine. You may need this after age 85.  Pneumococcal 13-valent conjugate (PCV13) vaccine. One dose is recommended after age 90.  Pneumococcal polysaccharide (PPSV23) vaccine. One dose is recommended after age 37. Talk to your health care provider about which screenings and vaccines you need and how often you need them. This information is not intended to replace advice given to you by your health care provider. Make sure you discuss any questions you have with your health care provider. Document Released: 02/14/2015 Document Revised: 10/08/2015 Document Reviewed: 11/19/2014 Elsevier Interactive Patient Education  2017 [place] Prevention in the Home Falls can cause injuries. They can happen to people of all  ages. There are many things you can do to make your home safe and to help prevent falls. What can I do on the outside of my home?  Regularly fix the edges of walkways and driveways and fix any cracks.  Remove anything that might make you trip as you walk through a door, such as a raised step or threshold.  Trim any bushes or trees on the path to your home.  Use bright outdoor lighting.  Clear any walking paths of anything that might make someone trip, such as rocks or tools.  Regularly check to see if handrails are loose or broken. Make sure that both sides of any steps have handrails.  Any raised decks and porches should have guardrails on the edges.  Have any leaves, snow, or ice cleared regularly.  Use sand or salt on walking paths during winter.  Clean up any spills in your garage right away. This includes oil or grease spills. What can I do in the bathroom?  Use night lights.  Install grab bars by the  toilet and in the tub and shower. Do not use towel bars as grab bars.  Use non-skid mats or decals in the tub or shower.  If you need to sit down in the shower, use a plastic, non-slip stool.  Keep the floor dry. Clean up any water that spills on the floor as soon as it happens.  Remove soap buildup in the tub or shower regularly.  Attach bath mats securely with double-sided non-slip rug tape.  Do not have throw rugs and other things on the floor that can make you trip. What can I do in the bedroom?  Use night lights.  Make sure that you have a light by your bed that is easy to reach.  Do not use any sheets or blankets that are too big for your bed. They should not hang down onto the floor.  Have a firm chair that has side arms. You can use this for support while you get dressed.  Do not have throw rugs and other things on the floor that can make you trip. What can I do in the kitchen?  Clean up any spills right away.  Avoid walking on wet floors.  Keep items that you use a lot in easy-to-reach places.  If you need to reach something above you, use a strong step stool that has a grab bar.  Keep electrical cords out of the way.  Do not use floor polish or wax that makes floors slippery. If you must use wax, use non-skid floor wax.  Do not have throw rugs and other things on the floor that can make you trip. What can I do with my stairs?  Do not leave any items on the stairs.  Make sure that there are handrails on both sides of the stairs and use them. Fix handrails that are broken or loose. Make sure that handrails are as long as the stairways.  Check any carpeting to make sure that it is firmly attached to the stairs. Fix any carpet that is loose or worn.  Avoid having throw rugs at the top or bottom of the stairs. If you do have throw rugs, attach them to the floor with carpet tape.  Make sure that you have a light switch at the top of the stairs and the bottom of  the stairs. If you do not have them, ask someone to add them for you. What else can I do  to help prevent falls?  Wear shoes that:  Do not have high heels.  Have rubber bottoms.  Are comfortable and fit you well.  Are closed at the toe. Do not wear sandals.  If you use a stepladder:  Make sure that it is fully opened. Do not climb a closed stepladder.  Make sure that both sides of the stepladder are locked into place.  Ask someone to hold it for you, if possible.  Clearly mark and make sure that you can see:  Any grab bars or handrails.  First and last steps.  Where the edge of each step is.  Use tools that help you move around (mobility aids) if they are needed. These include:  Canes.  Walkers.  Scooters.  Crutches.  Turn on the lights when you go into a dark area. Replace any light bulbs as soon as they burn out.  Set up your furniture so you have a clear path. Avoid moving your furniture around.  If any of your floors are uneven, fix them.  If there are any pets around you, be aware of where they are.  Review your medicines with your doctor. Some medicines can make you feel dizzy. This can increase your chance of falling. Ask your doctor what other things that you can do to help prevent falls. This information is not intended to replace advice given to you by your health care provider. Make sure you discuss any questions you have with your health care provider. Document Released: 11/14/2008 Document Revised: 06/26/2015 Document Reviewed: 02/22/2014 Elsevier Interactive Patient Education  2017 [employer].

## 2019-07-09 NOTE — Progress Notes (Signed)
Subjective:   Joshua [name] is a 69 y.o. male who presents for Medicare Annual/Subsequent preventive examination.  Review of Systems:  Cardiac Risk Factors include: advanced age (>6men, >52 women);hypertension;sedentary lifestyle     Objective:    Vitals: BP 127/88 (BP Location: Left Arm, Patient Position: Sitting, Cuff Size: Normal)   Pulse 76   Temp 97.9 F (36.6 C)   Resp 12   Ht 5\' 7"  (1.702 m)   Wt 218 lb (98.9 kg)   SpO2 97%   BMI 34.14 kg/m   Body mass index is 34.14 kg/m.  Advanced Directives 07/09/2019 06/28/2018 06/23/2017  Does Patient Have a Medical Advance Directive? No No No  Does patient want to make changes to medical advance directive? - - Yes (MAU/Ambulatory/Procedural Areas - Information given)  Would patient like information on creating a medical advance directive? Yes (Inpatient - patient defers creating a medical advance directive at this time - Information given) Yes (MAU/Ambulatory/Procedural Areas - Information given) -    Tobacco Social History   Tobacco Use  Smoking Status Never Smoker  Smokeless Tobacco Never Used     Counseling given: Not Answered   Clinical Intake:  Pre-visit preparation completed: Yes  Pain : 0-10 Pain Score: 2  Pain Type: Chronic pain Pain Location: Back Pain Orientation: Lower Pain Descriptors / Indicators: Aching Pain Onset: More than a month ago Pain Frequency: Intermittent Pain Relieving Factors: Tylenol  Pain Relieving Factors: Tylenol  Nutritional Status: BMI > 30  Obese Diabetes: No           Past Medical History:  Diagnosis Date  . Allergy   . Amnesia 06/13/2014  . B12 deficiency 06/13/2014  . Benign essential HTN 10/30/2014  . BPH (benign prostatic hyperplasia)   . BPH with obstruction/lower urinary tract symptoms 05/15/2015  . Hypogonadism in male   . Hypothyroidism 10/30/2014   Past Surgical History:  Procedure Laterality Date  . COLONOSCOPY  March 2016  . ESOPHAGOGASTRODUODENOSCOPY  ENDOSCOPY  March 2016  . EYE SURGERY Bilateral    cataracts  . KNEE SURGERY Right   . SHOULDER SURGERY Left   . TONSILLECTOMY     Family History  Problem Relation Age of Onset  . Breast cancer Mother   . COPD Father   . Heart disease Father   . Prostate cancer Father    Social History   Socioeconomic History  . Marital status: Married    Spouse name: Not on file  . Number of children: Not on file  . Years of education: Not on file  . Highest education level: High school graduate  Occupational History  . Not on file  Tobacco Use  . Smoking status: Never Smoker  . Smokeless tobacco: Never Used  Substance and Sexual Activity  . Alcohol use: Yes    Alcohol/week: 0.0 standard drinks    Comment: Rarely  . Drug use: No  . Sexual activity: Not on file  Other Topics Concern  . Not on file  Social History Narrative  . Not on file   Social Determinants of Health   Financial Resource Strain: Low Risk   . Difficulty of Paying Living Expenses: Not hard at all  Food Insecurity: No Food Insecurity  . Worried About Charity fundraiser in the Last Year: Never true  . Ran Out of Food in the Last Year: Never true  Transportation Needs: No Transportation Needs  . Lack of Transportation (Medical): No  . Lack of Transportation (Non-Medical): No  Physical Activity: Inactive  . Days of Exercise per Week: 0 days  . Minutes of Exercise per Session: 0 min  Stress: No Stress Concern Present  . Feeling of Stress : Not at all  Social Connections: Moderately Isolated  . Frequency of Communication with Friends and Family: Never  . Frequency of Social Gatherings with Friends and Family: Never  . Attends Religious Services: Never  . Active Member of Clubs or Organizations: No  . Attends Archivist Meetings: Never  . Marital Status: Married    Outpatient Encounter Medications as of 07/09/2019  Medication Sig  . Cyanocobalamin (RA VITAMIN B-12 TR) 1000 MCG TBCR Take 1 tablet (1,000  mcg total) by mouth daily.  Marland Kitchen levothyroxine (SYNTHROID) 137 MCG tablet Take 1 tablet (137 mcg total) by mouth daily before breakfast.  . losartan (COZAAR) 100 MG tablet Take 1 tablet (100 mg total) by mouth daily.  . cyclobenzaprine (FLEXERIL) 5 MG tablet Take 1 tablet (5 mg total) by mouth 3 (three) times daily as needed for muscle spasms. (Patient not taking: Reported on 07/09/2019)  . predniSONE (DELTASONE) 10 MG tablet Take 6 tabs day one, 5 tabs day two, 4 tabs day three, etc (Patient not taking: Reported on 07/09/2019)   No facility-administered encounter medications on file as of 07/09/2019.    Activities of Daily Living In your present state of health, do you have any difficulty performing the following activities: 07/09/2019  Hearing? Y  Vision? N  Difficulty concentrating or making decisions? Y  Comment difficulty remebering things, misplaces items at times  Walking or climbing stairs? N  Dressing or bathing? N  Doing errands, shopping? N  Preparing Food and eating ? N  Using the Toilet? N  In the past six months, have you accidently leaked urine? N  Do you have problems with loss of bowel control? N  Managing your Medications? N  Managing your Finances? N  Housekeeping or managing your Housekeeping? N  Some recent data might be hidden    Patient Care Team: Guadalupe Maple, MD as PCP - General (Family Medicine) Manya Silvas, MD (Inactive) (Gastroenterology) Dawayne Patricia, MD as Referring Physician (Orthopedic Surgery) Carloyn Manner, MD as Referring Physician (Otolaryngology)   Assessment:   This is a routine wellness examination for Joshua [name].  Exercise Activities and Dietary recommendations Current Exercise Habits: The patient does not participate in regular exercise at present, Exercise limited by: orthopedic condition(s)(joint pain, back pain)  Goals Addressed   None     Fall Risk: Fall Risk  07/09/2019 06/28/2018 02/20/2018 06/23/2017 06/10/2016  Falls in the  past year? 0 0 0 No No  Number falls in past yr: 0 - - - -  Injury with Fall? 0 - - - -  Follow up - - Falls evaluation completed - -    FALL RISK PREVENTION PERTAINING TO THE HOME:  Any stairs in or around the home? Yes  If so, are there any without handrails? Yes   Home free of loose throw rugs in walkways, pet beds, electrical cords, etc? Yes  Adequate lighting in your home to reduce risk of falls? Yes   ASSISTIVE DEVICES UTILIZED TO PREVENT FALLS:  Life alert? No  Use of a cane, walker or w/c? No  Grab bars in the bathroom? Yes  Shower chair or bench in shower? Yes  Elevated toilet seat or a handicapped toilet? No   TIMED UP AND GO:  Was the test performed? No .  Unable to  perform due to patient feeling dizzy2    Education: Fall risk prevention has been discussed.  Intervention(s) required? Yes  DME/home health order needed?  No   Depression Screen PHQ 2/9 Scores 07/09/2019 06/28/2018 06/23/2017 06/10/2016  PHQ - 2 Score 0 0 0 0    Cognitive Function     6CIT Screen 07/09/2019 06/28/2018 06/23/2017  What Year? 0 points 0 points 0 points  What month? 0 points 0 points 0 points  What time? 0 points 0 points 0 points  Count back from 20 0 points 0 points 0 points  Months in reverse 4 points 0 points 0 points  Repeat phrase 0 points 0 points 0 points  Total Score 4 0 0    Immunization History  Administered Date(s) Administered  . Influenza, High Dose Seasonal PF 10/30/2014, 11/06/2015, 11/08/2016, 10/31/2017  . Influenza,inj,Quad PF,6+ Mos 10/30/2014, 11/01/2018  . Pneumococcal Conjugate-13 10/29/2013  . Pneumococcal Polysaccharide-23 06/23/2017  . Td 10/11/2007, 02/20/2018  . Zoster 02/24/2011    Qualifies for Shingles Vaccine? Yes  Zostavax completed . Due for Shingrix. Education has been provided regarding the importance of this vaccine. Pt has been advised to call insurance company to determine out of pocket expense. Advised may also receive vaccine at local  pharmacy or Health Dept. Verbalized acceptance and understanding.  Tdap: Up o date  Flu Vaccine: Due 10/2019  Pneumococcal Vaccine: Up to date  Covid-19 Vaccine:  Completed vaccines  Screening Tests Health Maintenance  Topic Date Due  . COVID-19 Vaccine (1) Never done  . INFLUENZA VACCINE  09/02/2019  . COLONOSCOPY  04/16/2024  . TETANUS/TDAP  02/21/2028  . Hepatitis C Screening  Completed  . PNA vac Low Risk Adult  Completed   Cancer Screenings:  Colorectal Screening: Completed 04/21/2014. Repeat every 10 years.  Lung Cancer Screening: (Low Dose CT Chest recommended if Age 81-80 years, 30 pack-year currently smoking OR have quit w/in 15years.) does not qualify.    Additional Screening:  Hepatitis C Screening: does not qualify; Completed 10/25/14  Vision Screening: Recommended annual ophthalmology exams for early detection of glaucoma and other disorders of the eye. Is the patient up to date with their annual eye exam?  Yes    Dental Screening: Recommended annual dental exams for proper oral hygiene  Community Resource Referral:  CRR required this visit?  No        Plan:  I have personally reviewed and addressed the Medicare Annual Wellness questionnaire and have noted the following in the patient's chart:  A. Medical and social history B. Use of alcohol, tobacco or illicit drugs  C. Current medications and supplements D. Functional ability and status E.  Nutritional status F.  Physical activity G. Advance directives H. List of other physicians I.  Hospitalizations, surgeries, and ER visits in previous 12 months J.  [place] such as hearing and vision if needed, cognitive and depression L. Referrals and appointments   In addition, I have reviewed and discussed with patient certain preventive protocols, quality metrics, and best practice recommendations. A written personalized care plan for preventive services as well as general preventive health  recommendations were provided to patient.   Signed,   Marta Antu, LPN  06/04/6501 Nurse Health Advisor   Nurse Notes: Patient complains of frequent dizzy spells. PCP notified & patient is seeing her today.

## 2019-07-09 NOTE — Assessment & Plan Note (Signed)
BPs stable and WNL, continue current regimen 

## 2019-07-09 NOTE — Assessment & Plan Note (Signed)
Stable, continue supplementation.

## 2019-07-09 NOTE — Patient Instructions (Signed)

## 2019-07-09 NOTE — Assessment & Plan Note (Signed)
Recheck TSH, adjust as needed. Continue current regimen 

## 2019-07-09 NOTE — Assessment & Plan Note (Signed)
Await PSA, continue to monitor.

## 2019-07-09 NOTE — Progress Notes (Signed)
BP 127/88   Pulse 76   Temp 97.9 F (36.6 C) (Oral)   Resp 12   Ht 5\' 7"  (1.702 m)   Wt 218 lb (98.9 kg)   SpO2 97%   BMI 34.14 kg/m    Subjective:    Patient ID: Joshua [name], male    DOB: May 30, 1950, 69 y.o.   MRN: [id number]  HPI: Joshua [name] is a 69 y.o. male  Chief Complaint  Patient presents with  . Follow-up   Here today for 6 month f/u chronic conditions.   Overall doing well, taking medications faithfully without side effects. Denies CP, SOB, HAs, fatigue. Home BPs running 120s/80 range. On synthroid which he states seems to be doing well, feeling good overall. B12 on supplementation.   Occasional room spinning spells, sometimes has to lay down to get them to go away. This has been ongoing for over a year intermittently. Has 1-2 short spells per month. Sometimes triggered by bending over a certain way or rolling in bed. No N/V with episodes. Denies CP, SOB, diaphoresis, syncope.   Relevant past medical, surgical, family and social history reviewed and updated as indicated. Interim medical history since our last visit reviewed. Allergies and medications reviewed and updated.  Review of Systems  Per HPI unless specifically indicated above     Objective:    BP 127/88   Pulse 76   Temp 97.9 F (36.6 C) (Oral)   Resp 12   Ht 5\' 7"  (1.702 m)   Wt 218 lb (98.9 kg)   SpO2 97%   BMI 34.14 kg/m   Wt Readings from Last 3 Encounters:  07/09/19 218 lb (98.9 kg)  07/09/19 218 lb (98.9 kg)  12/20/18 218 lb (98.9 kg)    Physical Exam Vitals and nursing note reviewed.  Constitutional:      Appearance: Normal appearance.  HENT:     Head: Atraumatic.  Eyes:     Extraocular Movements: Extraocular movements intact.     Conjunctiva/sclera: Conjunctivae normal.  Cardiovascular:     Rate and Rhythm: Normal rate and regular rhythm.  Pulmonary:     Effort: Pulmonary effort is normal.     Breath sounds: Normal breath sounds.  Musculoskeletal:      General: Normal range of motion.     Cervical back: Normal range of motion and neck supple.  Skin:    General: Skin is warm and dry.  Neurological:     General: No focal deficit present.     Mental Status: He is oriented to person, place, and time.  Psychiatric:        Mood and Affect: Mood normal.        Thought Content: Thought content normal.        Judgment: Judgment normal.     Results for orders placed or performed in visit on 28/31/51  Basic metabolic panel  Result Value Ref Range   Glucose 80 65 - 99 mg/dL   BUN 18 8 - 27 mg/dL   Creatinine, Ser 1.02 0.76 - 1.27 mg/dL   GFR calc non Af Amer 75 >59 mL/min/1.73   GFR calc Af Amer 86 >59 mL/min/1.73   BUN/Creatinine Ratio 18 10 - 24   Sodium 144 134 - 144 mmol/L   Potassium 4.6 3.5 - 5.2 mmol/L   Chloride 99 96 - 106 mmol/L   CO2 24 20 - 29 mmol/L   Calcium 9.9 8.6 - 10.2 mg/dL  CBC w/Diff  Result Value Ref  Range   WBC 7.3 3.4 - 10.8 x10E3/uL   RBC 4.95 4.14 - 5.80 x10E6/uL   Hemoglobin 15.7 13.0 - 17.7 g/dL   Hematocrit 45.9 37.5 - 51.0 %   MCV 93 79 - 97 fL   MCH 31.7 26.6 - 33.0 pg   MCHC 34.2 31.5 - 35.7 g/dL   RDW 13.0 11.6 - 15.4 %   Platelets 178 150 - 450 x10E3/uL   Neutrophils 64 Not Estab. %   Lymphs 22 Not Estab. %   Monocytes 10 Not Estab. %   Eos 2 Not Estab. %   Basos 1 Not Estab. %   Neutrophils Absolute 4.7 1.4 - 7.0 x10E3/uL   Lymphocytes Absolute 1.6 0.7 - 3.1 x10E3/uL   Monocytes Absolute 0.7 0.1 - 0.9 x10E3/uL   EOS (ABSOLUTE) 0.2 0.0 - 0.4 x10E3/uL   Basophils Absolute 0.0 0.0 - 0.2 x10E3/uL   Immature Granulocytes 1 Not Estab. %   Immature Grans (Abs) 0.1 0.0 - 0.1 x10E3/uL  TSH  Result Value Ref Range   TSH 0.815 0.450 - 4.500 uIU/mL      Assessment & Plan:   Problem List Items Addressed This Visit      Cardiovascular and Mediastinum   Benign essential HTN - Primary    BPs stable and WNL, continue current regimen      Relevant Medications   losartan (COZAAR) 100 MG tablet     Other Relevant Orders   Comprehensive metabolic panel     Endocrine   Hypothyroidism    Recheck TSH, adjust as needed. Continue current regimen      Relevant Medications   levothyroxine (SYNTHROID) 137 MCG tablet   Other Relevant Orders   TSH     Genitourinary   BPH with obstruction/lower urinary tract symptoms    Await PSA, continue to monitor.       Relevant Orders   PSA     Other   B12 deficiency    Stable, continue supplementation       Other Visit Diagnoses    Dizziness       Consistent with vertigo, declines further workup or prn meclizine script. Epley maneuvers reviewed, will refer for vestibular PT if not resolving soon       Follow up plan: Return in about 6 months (around 01/08/2020) for 6 month f/u.

## 2019-07-10 ENCOUNTER — Other Ambulatory Visit: Payer: Self-pay | Admitting: Family Medicine

## 2019-07-10 DIAGNOSIS — E039 Hypothyroidism, unspecified: Secondary | ICD-10-CM

## 2019-07-10 LAB — COMPREHENSIVE METABOLIC PANEL
ALT: 20 IU/L (ref 0–44)
AST: 25 IU/L (ref 0–40)
Albumin/Globulin Ratio: 2 (ref 1.2–2.2)
Albumin: 4.5 g/dL (ref 3.7–4.7)
Alkaline Phosphatase: 87 IU/L (ref 48–121)
BUN/Creatinine Ratio: 15 (ref 10–24)
BUN: 15 mg/dL (ref 8–27)
Bilirubin Total: 0.7 mg/dL (ref 0.0–1.2)
CO2: 23 mmol/L (ref 20–29)
Calcium: 9.4 mg/dL (ref 8.6–10.2)
Chloride: 103 mmol/L (ref 96–106)
Creatinine, Ser: 1.03 mg/dL (ref 0.76–1.27)
GFR calc Af Amer: 84 mL/min/{1.73_m2} (ref >59)
GFR calc non Af Amer: 73 mL/min/{1.73_m2} (ref >59)
Globulin, Total: 2.2 g/dL (ref 1.5–4.5)
Glucose: 93 mg/dL (ref 65–99)
Potassium: 3.9 mmol/L (ref 3.5–5.2)
Sodium: 140 mmol/L (ref 134–144)
Total Protein: 6.7 g/dL (ref 6.0–8.5)

## 2019-07-10 LAB — TSH: TSH: 0.191 u[IU]/mL — ABNORMAL LOW (ref 0.450–4.500)

## 2019-07-10 LAB — PSA: Prostate Specific Ag, Serum: 2.3 ng/mL (ref 0.0–4.0)

## 2019-07-10 MED ORDER — LEVOTHYROXINE SODIUM 125 MCG PO TABS: 125.0000 ug | ORAL_TABLET | Freq: Every day | ORAL | 1 refills | Status: DC

## 2019-08-10 DIAGNOSIS — L820 Inflamed seborrheic keratosis: Secondary | ICD-10-CM | POA: Diagnosis not present

## 2019-08-10 DIAGNOSIS — L821 Other seborrheic keratosis: Secondary | ICD-10-CM | POA: Diagnosis not present

## 2019-08-10 DIAGNOSIS — D225 Melanocytic nevi of trunk: Secondary | ICD-10-CM | POA: Diagnosis not present

## 2019-08-10 DIAGNOSIS — D2261 Melanocytic nevi of right upper limb, including shoulder: Secondary | ICD-10-CM | POA: Diagnosis not present

## 2019-08-10 DIAGNOSIS — D2262 Melanocytic nevi of left upper limb, including shoulder: Secondary | ICD-10-CM | POA: Diagnosis not present

## 2019-08-10 DIAGNOSIS — L218 Other seborrheic dermatitis: Secondary | ICD-10-CM | POA: Diagnosis not present

## 2019-08-10 DIAGNOSIS — L538 Other specified erythematous conditions: Secondary | ICD-10-CM | POA: Diagnosis not present

## 2019-08-21 ENCOUNTER — Other Ambulatory Visit: Payer: Self-pay

## 2019-08-21 ENCOUNTER — Other Ambulatory Visit: Payer: Medicare Other

## 2019-08-21 DIAGNOSIS — E039 Hypothyroidism, unspecified: Secondary | ICD-10-CM | POA: Diagnosis not present

## 2019-08-22 LAB — TSH: TSH: 0.719 u[IU]/mL (ref 0.450–4.500)

## 2019-08-23 ENCOUNTER — Other Ambulatory Visit: Payer: Self-pay

## 2019-08-23 MED ORDER — LEVOTHYROXINE SODIUM 125 MCG PO TABS: 125.0000 ug | ORAL_TABLET | Freq: Every day | ORAL | 1 refills | Status: DC

## 2019-08-23 NOTE — Progress Notes (Signed)
Sent to mail order earlier today

## 2019-10-11 ENCOUNTER — Ambulatory Visit: Payer: Medicare Other | Admitting: Family Medicine

## 2019-10-11 ENCOUNTER — Encounter: Payer: Self-pay | Admitting: Nurse Practitioner

## 2019-10-11 ENCOUNTER — Other Ambulatory Visit: Payer: Self-pay

## 2019-10-11 ENCOUNTER — Ambulatory Visit (INDEPENDENT_AMBULATORY_CARE_PROVIDER_SITE_OTHER): Payer: Medicare Other | Admitting: Nurse Practitioner

## 2019-10-11 VITALS — BP 118/68 | HR 73 | Temp 98.3°F | Wt 212.8 lb

## 2019-10-11 DIAGNOSIS — N401 Enlarged prostate with lower urinary tract symptoms: Secondary | ICD-10-CM

## 2019-10-11 DIAGNOSIS — R350 Frequency of micturition: Secondary | ICD-10-CM | POA: Diagnosis not present

## 2019-10-11 DIAGNOSIS — R7303 Prediabetes: Secondary | ICD-10-CM

## 2019-10-11 DIAGNOSIS — N138 Other obstructive and reflux uropathy: Secondary | ICD-10-CM

## 2019-10-11 DIAGNOSIS — M7989 Other specified soft tissue disorders: Secondary | ICD-10-CM

## 2019-10-11 DIAGNOSIS — Z8639 Personal history of other endocrine, nutritional and metabolic disease: Secondary | ICD-10-CM

## 2019-10-11 LAB — UA/M W/RFLX CULTURE, ROUTINE
Bilirubin, UA: NEGATIVE
Glucose, UA: NEGATIVE
Ketones, UA: NEGATIVE
Leukocytes,UA: NEGATIVE
Nitrite, UA: NEGATIVE
Protein,UA: NEGATIVE
RBC, UA: NEGATIVE
Specific Gravity, UA: 1.01 (ref 1.005–1.030)
Urobilinogen, Ur: 0.2 mg/dL (ref 0.2–1.0)
pH, UA: 6 (ref 5.0–7.5)

## 2019-10-11 NOTE — Progress Notes (Signed)
BP 118/68 (BP Location: Left Arm, Cuff Size: Small)   Pulse 73   Temp 98.3 F (36.8 C) (Oral)   Wt 212 lb 12.8 oz (96.5 kg)   SpO2 98%   BMI 33.33 kg/m    Subjective:    Patient ID: Joshua [name], male    DOB: 1950/09/22, 69 y.o.   MRN: [id number]  HPI: Joshua [name] is a 69 y.o. male presenting with a lump on his left hip.  Chief Complaint  Patient presents with  . Mass    left hip   SKIN INFECTION Had similar area about 1.5 years ago and wife "stuck it with a needle" and "got a lot of white gunk out."  Wife stuck current lump with a needle and nothing has come out so far.  Is requesting referral to a Dermatologist to have it removed. Duration: days - 3-4 days ago Location: left hip History of trauma in area: no Pain: yes Quality: sore, steady, burning Severity: mild Redness: yes Swelling: yes Oozing: no Pus: no Fevers: no Nausea/vomiting: no  Changes in bowel movement: no Changes in appetite: no Status: stable Treatments attempted: ice pack, wife pricked it with a needle  Tetanus: UTD   URINARY FREQUENCY Onset: months Dysuria: no Urinary frequency: yes Urgency: no Small volume voids: no Symptom severity: mild Urinary incontinence: no Foul odor: no Hematuria: no Abdominal pain: no Back pain: no Suprapubic pain/pressure: no Flank pain: no Fever:  no Vomiting: no Relief with cranberry juice: not tried Relief with pyridium: not tried Status: stable Previous urinary tract infection: no Recurrent urinary tract infection: no Treatments attempted: stopped diuretic; no improvement     Allergies  Allergen Reactions  . Aspirin Hives  . Celery Oil Swelling  . Flomax [Tamsulosin Hcl] Hives  . Hydromorphone Hives  . Oysters [Shellfish Allergy]   . Amlodipine Besylate Swelling    Ankle edema  . Latex Rash   Outpatient Encounter Medications as of 10/11/2019  Medication Sig  . Cyanocobalamin (RA VITAMIN B-12 TR) 1000 MCG TBCR Take 1 tablet (1,000  mcg total) by mouth daily.  Marland Kitchen levothyroxine (SYNTHROID) 125 MCG tablet Take 1 tablet (125 mcg total) by mouth daily.  Marland Kitchen losartan (COZAAR) 100 MG tablet Take 1 tablet (100 mg total) by mouth daily.  . cyclobenzaprine (FLEXERIL) 5 MG tablet Take 1 tablet (5 mg total) by mouth 3 (three) times daily as needed for muscle spasms. (Patient not taking: Reported on 07/09/2019)  . [DISCONTINUED] predniSONE (DELTASONE) 10 MG tablet Take 6 tabs day one, 5 tabs day two, 4 tabs day three, etc   No facility-administered encounter medications on file as of 10/11/2019.   Patient Active Problem List   Diagnosis Date Noted  . Cyst of soft tissue 10/11/2019  . Urinary frequency 10/11/2019  . Laceration of arm, right, initial encounter 02/20/2018  . Small vessel disease ([place]) 10/25/2017  . Chest pain 08/24/2017  . Pernicious anemia 06/10/2016  . Advanced care planning/counseling discussion 06/10/2016  . Screening for prostate cancer 06/10/2015  . Nephrolithiasis 06/10/2015  . Erectile dysfunction 06/10/2015  . BPH with obstruction/lower urinary tract symptoms 05/15/2015  . Benign essential HTN 10/30/2014  . Hypothyroidism 10/30/2014  . B12 deficiency 06/13/2014  . Memory loss 06/13/2014   Past Medical History:  Diagnosis Date  . Allergy   . Amnesia 06/13/2014  . B12 deficiency 06/13/2014  . Benign essential HTN 10/30/2014  . BPH (benign prostatic hyperplasia)   . BPH with obstruction/lower urinary tract symptoms 05/15/2015  .  Hypogonadism in male   . Hypothyroidism 10/30/2014   Relevant past medical, surgical, family and social history reviewed and updated as indicated. Interim medical history since our last visit reviewed.  Review of Systems  Constitutional: Negative.  Negative for fatigue and fever.  Eyes: Negative.   Gastrointestinal: Negative.   Endocrine: Positive for polyuria. Negative for cold intolerance, heat intolerance, polydipsia and polyphagia.  Genitourinary: Positive for frequency.  Negative for decreased urine volume, dysuria, enuresis, flank pain, hematuria and urgency.  Musculoskeletal: Negative.   Skin: Positive for color change and wound. Negative for pallor and rash.  Neurological: Negative.   Hematological: Negative.   Psychiatric/Behavioral: Negative.    Per HPI unless specifically indicated above     Objective:    BP 118/68 (BP Location: Left Arm, Cuff Size: Small)   Pulse 73   Temp 98.3 F (36.8 C) (Oral)   Wt 212 lb 12.8 oz (96.5 kg)   SpO2 98%   BMI 33.33 kg/m   Wt Readings from Last 3 Encounters:  10/11/19 212 lb 12.8 oz (96.5 kg)  07/09/19 218 lb (98.9 kg)  07/09/19 218 lb (98.9 kg)    Physical Exam Constitutional:      General: He is not in acute distress.    Appearance: Normal appearance. He is not toxic-appearing.  Eyes:     General: No scleral icterus.       Right eye: No discharge.        Left eye: No discharge.     Extraocular Movements: Extraocular movements intact.  Abdominal:     General: Abdomen is flat. There is no distension.  Genitourinary:    Comments: Deferred using shared decision making Musculoskeletal:        General: Normal range of motion.     Right lower leg: No edema.     Left lower leg: No edema.  Skin:    General: Skin is warm and dry.     Findings: Abscess present.       Neurological:     General: No focal deficit present.     Mental Status: He is alert.     Motor: No weakness.     Gait: Gait normal.  Psychiatric:        Mood and Affect: Mood normal.        Behavior: Behavior normal.        Thought Content: Thought content normal.        Judgment: Judgment normal.       Assessment & Plan:   Problem List Items Addressed This Visit      Genitourinary   BPH with obstruction/lower urinary tract symptoms     Other   Cyst of soft tissue - Primary    Acute, ongoing.  Likely cyst material that may need to be surgically excised.  Offered antibiotics, patient declined and stated he would prefer to  see Dermatologist before antibiotics.  No indications for I&D today - non-fluctuant, no drainage or oozing.  Patient to make [place] aware if he would like prescription for antibiotics.  Continue hot and cold compresses for pain relief.       Relevant Orders   Ambulatory referral to Dermatology   Urinary frequency    Chronic, ongoing.  Likely due to BPH symptoms, will obtain UA to rule out UTI, Hgba1c to rule out diabetes, and PSA to rule out possibly prostate malignancy.  If continues to be bothersome, consider re-consultation with urologist for management of BPH.  Relevant Orders   PSA   HgB A1c   UA/M w/rflx Culture, Routine    Other Visit Diagnoses    Personal history of other endocrine, nutritional and metabolic disease        Relevant Orders   HgB A1c       Follow up plan: Return if symptoms worsen or fail to improve.   35+ minutes spent with the patient today.

## 2019-10-11 NOTE — Assessment & Plan Note (Signed)
Chronic, ongoing.  Likely due to BPH symptoms, will obtain UA to rule out UTI, Hgba1c to rule out diabetes, and PSA to rule out possibly prostate malignancy.  If continues to be bothersome, consider re-consultation with urologist for management of BPH.

## 2019-10-11 NOTE — Assessment & Plan Note (Addendum)
Acute, ongoing.  Likely cyst material that may need to be surgically excised.  Offered antibiotics, patient declined and stated he would prefer to see Dermatologist before antibiotics.  No indications for I&D today - non-fluctuant, no drainage or oozing.  Patient to make [place] aware if he would like prescription for antibiotics.  Continue hot and cold compresses for pain relief.

## 2019-10-12 ENCOUNTER — Telehealth: Payer: Self-pay | Admitting: Nurse Practitioner

## 2019-10-12 DIAGNOSIS — R7303 Prediabetes: Secondary | ICD-10-CM | POA: Insufficient documentation

## 2019-10-12 DIAGNOSIS — M7989 Other specified soft tissue disorders: Secondary | ICD-10-CM

## 2019-10-12 LAB — HEMOGLOBIN A1C
Est. average glucose Bld gHb Est-mCnc: 123 mg/dL
Hgb A1c MFr Bld: 5.9 % — ABNORMAL HIGH (ref 4.8–5.6)

## 2019-10-12 LAB — PSA: Prostate Specific Ag, Serum: 2.5 ng/mL (ref 0.0–4.0)

## 2019-10-12 MED ORDER — DOXYCYCLINE HYCLATE 100 MG PO TABS: 100.0000 mg | ORAL_TABLET | Freq: Two times a day (BID) | ORAL | 0 refills | Status: AC

## 2019-10-12 NOTE — Telephone Encounter (Signed)
Copied from [place] [id number]. Topic: General - Other >> Oct 12, 2019  9:50 AM Hinda Lenis D wrote: Reason for CRM: Pt saw Apolonio Schneiders yesterday and dicline antibiotics he change his mine and now [place] them / please advise

## 2019-10-12 NOTE — Telephone Encounter (Signed)
Pt notified, verbalized understanding.

## 2019-10-12 NOTE — Progress Notes (Signed)
Please let Joshua [name] know his urine testing and PSA were normal.  His HgbA1c (diabetes screen) did show prediabetes - it was 5.9% and we like to see it below 5.6%.  This may explain his urinary symptoms, but what is far more likely causing his increased urinary frequency is his enlarged prostate.  I recommend he watch his carbohydrate and simple sugar intake and try to limit it as much as possible to see if that helps with his blood sugar regulation.  If his urinary symptoms continue to be very troublesome to him, I would recommend he get in with his Urologist sooner rather than later to have this addressed.

## 2019-10-15 DIAGNOSIS — L728 Other follicular cysts of the skin and subcutaneous tissue: Secondary | ICD-10-CM | POA: Diagnosis not present

## 2019-11-21 DIAGNOSIS — Z23 Encounter for immunization: Secondary | ICD-10-CM | POA: Diagnosis not present

## 2019-12-29 ENCOUNTER — Other Ambulatory Visit: Payer: Self-pay | Admitting: Family Medicine

## 2020-01-08 ENCOUNTER — Ambulatory Visit (INDEPENDENT_AMBULATORY_CARE_PROVIDER_SITE_OTHER): Payer: Medicare Other | Admitting: Family Medicine

## 2020-01-08 ENCOUNTER — Other Ambulatory Visit: Payer: Self-pay

## 2020-01-08 ENCOUNTER — Ambulatory Visit: Payer: Medicare Other | Admitting: Family Medicine

## 2020-01-08 ENCOUNTER — Telehealth: Payer: Self-pay

## 2020-01-08 ENCOUNTER — Encounter: Payer: Self-pay | Admitting: Family Medicine

## 2020-01-08 ENCOUNTER — Ambulatory Visit: Payer: Medicare Other | Admitting: Nurse Practitioner

## 2020-01-08 VITALS — BP 154/92 | HR 64 | Temp 97.9°F | Wt 211.1 lb

## 2020-01-08 DIAGNOSIS — N138 Other obstructive and reflux uropathy: Secondary | ICD-10-CM

## 2020-01-08 DIAGNOSIS — E538 Deficiency of other specified B group vitamins: Secondary | ICD-10-CM | POA: Diagnosis not present

## 2020-01-08 DIAGNOSIS — I10 Essential (primary) hypertension: Secondary | ICD-10-CM

## 2020-01-08 DIAGNOSIS — G44209 Tension-type headache, unspecified, not intractable: Secondary | ICD-10-CM | POA: Diagnosis not present

## 2020-01-08 DIAGNOSIS — R519 Headache, unspecified: Secondary | ICD-10-CM | POA: Insufficient documentation

## 2020-01-08 DIAGNOSIS — E039 Hypothyroidism, unspecified: Secondary | ICD-10-CM

## 2020-01-08 DIAGNOSIS — E669 Obesity, unspecified: Secondary | ICD-10-CM | POA: Diagnosis not present

## 2020-01-08 DIAGNOSIS — N401 Enlarged prostate with lower urinary tract symptoms: Secondary | ICD-10-CM | POA: Diagnosis not present

## 2020-01-08 MED ORDER — FINASTERIDE 5 MG PO TABS: 5.0000 mg | ORAL_TABLET | Freq: Every day | ORAL | 0 refills | Status: DC

## 2020-01-08 NOTE — Telephone Encounter (Signed)
Called pt scheduled for 12/9

## 2020-01-08 NOTE — Progress Notes (Signed)
    SUBJECTIVE:   CHIEF COMPLAINT / HPI: 6 month f/u  Past Medical History:  Diagnosis Date  . Allergy   . Amnesia 06/13/2014  . B12 deficiency 06/13/2014  . Benign essential HTN 10/30/2014  . BPH (benign prostatic hyperplasia)   . BPH with obstruction/lower urinary tract symptoms 05/15/2015  . Hypogonadism in male   . Hypothyroidism 10/30/2014   Hypertension: - Medications: losartan 100mg  daily - Compliance: good, but didn't take this morning.  - Checking BP at home: occasionally, 130s SBP - Denies any SOB, CP, vision changes, LE edema, medication SEs, or symptoms of hypotension - has CPAP, just got new nose piece - Diet: is trying to cut down on amount - Exercise: no formal exercise  Hypothyroidism - Medications: Synthroid 111mcg daily - Current symptoms:  heat / cold intolerance - Denies change in energy level, diarrhea, nervousness and palpitations - Symptoms have stabilized  Vitamin B12 deficiency - has stopped taking oral tablets. Not sure if he needs them. - some fatigue and issues with balance.  Benign Prostatic Hypertrophy - Medications: none - Symptoms: frequency, incomplete emptying, nocturia three times a night, dribbling  - Denies: flank pain, gross hematuria and recurrent UTI - no personal history and no family history of prostate cancer - Last PSA wnl.  Headache Headaches 2-3 weeks, b/l temple, steady pressure. With sinus drainage and runny nose. Not tried anything for it. No vision changes, trouble walking or speaking. Compliant with CPAP.  Healthcare Maintenance - Vaccines: due for Shingrix (previously had Zostavax 2013) - Colonoscopy: UTD - Mammogram: n/a - Pap Smear: n/a - DEXA Scan: n/a   OBJECTIVE:   BP (!) 154/92   Pulse 64   Temp 97.9 F (36.6 C)   Wt 211 lb 2 oz (95.8 kg)   SpO2 99%   BMI 33.07 kg/m   Gen: obese, in NAD HEENT: no tenderness to palpation of frontal or maxillary sinuses Cardiac: RRR, no murmur Lungs: CTAB Abd: soft,  nonTTP, +BS Ext: WWP, no edema Neuro: A&O, speech and gait normal  ASSESSMENT/PLAN:   Benign essential HTN Compliant with med regimen, with at home measurements at goal. BP elevated today though didn't take med this am. No changes made today. Will obtain labs. F/u in 3 months.  Hypothyroidism Recheck TSH today.  BPH with obstruction/lower urinary tract symptoms Symptoms not controlled. Previously intolerant to tamsulosin, will trial finasteride. F/u in 3 months.  B12 deficiency Recheck labs today.  Obesity (BMI 30-39.9) Counseling done for diet and exercise. Handout provided.  Headache Likely 2/2 URI sx, tension type. No neuro complaints or deficits on exam. Compliant with CPAP. Recommend OTC pain relief. RTC if worsens or not relieved with OTC pain control.    Myles Gip, DO

## 2020-01-08 NOTE — Telephone Encounter (Signed)
So sorry to hear this, unfortunately he'll need an appointment so we can take a look at it.

## 2020-01-08 NOTE — Telephone Encounter (Signed)
Copied from [place] [id number]. Topic: Quick Communication - See Telephone Encounter >> Jan 08, 2020 12:42 PM Loma [place] wrote: CRM for notification. See Telephone encounter for: 01/08/20. Pt saw [place] this am and forgot to tell her that his right ear has really been hurting and was wanting  drops for pain. It would be [facility] in [place]. If this cannot be done FU with a cab for another appt as his ear had really been giving him problems. Pt states was forgetting as meeting a new dr

## 2020-01-08 NOTE — Assessment & Plan Note (Signed)
Recheck labs today. 

## 2020-01-08 NOTE — Assessment & Plan Note (Signed)
Compliant with med regimen, with at home measurements at goal. BP elevated today though didn't take med this am. No changes made today. Will obtain labs. F/u in 3 months.

## 2020-01-08 NOTE — Assessment & Plan Note (Signed)
Counseling done for diet and exercise. Handout provided.

## 2020-01-08 NOTE — Assessment & Plan Note (Signed)
Likely 2/2 URI sx, tension type. No neuro complaints or deficits on exam. Compliant with CPAP. Recommend OTC pain relief. RTC if worsens or not relieved with OTC pain control.

## 2020-01-08 NOTE — Assessment & Plan Note (Signed)
Recheck TSH today.  

## 2020-01-08 NOTE — Assessment & Plan Note (Signed)
Symptoms not controlled. Previously intolerant to tamsulosin, will trial finasteride. F/u in 3 months.

## 2020-01-08 NOTE — Patient Instructions (Addendum)
It was great to see you!  Our plans for today:  - No changes to your losartan or levothyroxine today.  - We are checking some labs today, we will call you or send you a letter with these results.  - We are starting a new medication for your prostate. Let us know if you have issues with this. - Try tylenol for your headache, let us know if this doesn't get better after your cold resolves.  - I recommend getting the updated shingles vaccine, you can get this from your pharmacy. - Follow up in 3 months.  Take care and seek immediate care sooner if you develop any concerns.   Dr. Ky Barban  Here is an example of what a healthy plate looks like:    ? Make half your plate fruits and vegetables.     ? Focus on whole fruits.     ? Vary your veggies.  ? Make half your grains whole grains. -     ? Look for the word "whole" at the beginning of the ingredients list    ? Some whole-grain ingredients include whole oats, whole-wheat flour,        whole-grain corn, whole-grain brown rice, and whole rye.  ? Move to low-fat and fat-free milk or yogurt.  ? Vary your protein routine. - Meat, fish, poultry (chicken, [place]), eggs, beans (kidney, pinto), dairy.  ? Drink and eat less sodium, saturated fat, and added sugars.

## 2020-01-09 ENCOUNTER — Encounter: Payer: Self-pay | Admitting: Family Medicine

## 2020-01-09 LAB — BASIC METABOLIC PANEL
BUN/Creatinine Ratio: 15 (ref 10–24)
BUN: 16 mg/dL (ref 8–27)
CO2: 23 mmol/L (ref 20–29)
Calcium: 9.8 mg/dL (ref 8.6–10.2)
Chloride: 100 mmol/L (ref 96–106)
Creatinine, Ser: 1.04 mg/dL (ref 0.76–1.27)
GFR calc Af Amer: 83 mL/min/{1.73_m2} (ref >59)
GFR calc non Af Amer: 72 mL/min/{1.73_m2} (ref >59)
Glucose: 86 mg/dL (ref 65–99)
Potassium: 4.5 mmol/L (ref 3.5–5.2)
Sodium: 136 mmol/L (ref 134–144)

## 2020-01-09 LAB — LIPID PANEL
Chol/HDL Ratio: 3.6 ratio (ref 0.0–5.0)
Cholesterol, Total: 169 mg/dL (ref 100–199)
HDL: 47 mg/dL (ref >39)
LDL Chol Calc (NIH): 96 mg/dL (ref 0–99)
Triglycerides: 151 mg/dL — ABNORMAL HIGH (ref 0–149)
VLDL Cholesterol Cal: 26 mg/dL (ref 5–40)

## 2020-01-09 LAB — VITAMIN B12: Vitamin B-12: 691 pg/mL (ref 232–1245)

## 2020-01-09 LAB — TSH: TSH: 1.61 u[IU]/mL (ref 0.450–4.500)

## 2020-01-09 NOTE — Progress Notes (Signed)
    SUBJECTIVE:   CHIEF COMPLAINT / HPI:   Past Medical History:  Diagnosis Date  . Allergy   . Amnesia 06/13/2014  . B12 deficiency 06/13/2014  . Benign essential HTN 10/30/2014  . BPH (benign prostatic hyperplasia)   . BPH with obstruction/lower urinary tract symptoms 05/15/2015  . Hypogonadism in male   . Hypothyroidism 10/30/2014   EAR PAIN Duration: 3 days Involved ear(s): right Severity:  mild  Quality:  aching Fever: no Otorrhea: no Upper respiratory infection symptoms: yes, but now improving Pruritus: yes but not new Hearing loss: yes, has hearing aids Water immersion no Using Q-tips: yes, bobby pin Recurrent otitis media: no  Status: better Treatments attempted: none  No prior ear surgeries.   OBJECTIVE:   BP 124/78   Pulse 98   Temp 98 F (36.7 C)   Wt 210 lb 4 oz (95.4 kg)   SpO2 99%   BMI 32.93 kg/m   Gen: well appearing, in NAD HEENT: TMs visible without bulging, erythema, purulence. External canals clear. No rashes or lesions visible. Oropharynx clear. Fair dentition.  ASSESSMENT/PLAN:   Ear pain Self-improving with resolution of URI/allergy symptoms. Exam wnl today. Recommend antihistamine if symptoms continue. F/u prn.  Obesity (BMI 30-39.9) Discussed results of lipid panel. Recommend lipid lowering diet and exercise, much discussion had regarding this, handout provided to patient. ASCVD risk 28%. Benefits and risks of statin therapy discussed, patient elects to start, Rx sent. Plan to recheck lipid panel at follow up.   Joshua Gip, DO

## 2020-01-10 ENCOUNTER — Other Ambulatory Visit: Payer: Self-pay

## 2020-01-10 ENCOUNTER — Ambulatory Visit (INDEPENDENT_AMBULATORY_CARE_PROVIDER_SITE_OTHER): Payer: Medicare Other | Admitting: Family Medicine

## 2020-01-10 ENCOUNTER — Encounter: Payer: Self-pay | Admitting: Family Medicine

## 2020-01-10 VITALS — BP 124/78 | HR 98 | Temp 98.0°F | Wt 210.2 lb

## 2020-01-10 DIAGNOSIS — E669 Obesity, unspecified: Secondary | ICD-10-CM

## 2020-01-10 DIAGNOSIS — H9201 Otalgia, right ear: Secondary | ICD-10-CM | POA: Diagnosis not present

## 2020-01-10 DIAGNOSIS — H9209 Otalgia, unspecified ear: Secondary | ICD-10-CM | POA: Insufficient documentation

## 2020-01-10 MED ORDER — ROSUVASTATIN CALCIUM 5 MG PO TABS: 5.0000 mg | ORAL_TABLET | Freq: Every day | ORAL | 3 refills | Status: DC

## 2020-01-10 NOTE — Patient Instructions (Addendum)
It was great to see you!  Our plans for today:  - Try taking an antihistamine to help with your allergy symptoms, claritin or zyrtec.  - If you develop fevers, discharge from your ears, let us know.  - Increase your physical activity and incorporate more fruits and vegetables in your diet (see below for tips). - Let me know if you don't tolerate the new cholesterol medication.   Here is an example of what a healthy plate looks like:    ? Make half your plate fruits and vegetables.     ? Focus on whole fruits.     ? Vary your veggies.  ? Make half your grains whole grains. -     ? Look for the word "whole" at the beginning of the ingredients list    ? Some whole-grain ingredients include whole oats, whole-wheat flour,        whole-grain corn, whole-grain brown rice, and whole rye.  ? Move to low-fat and fat-free milk or yogurt.  ? Vary your protein routine. - Meat, fish, poultry (chicken, [place]), eggs, beans (kidney, pinto), dairy.  ? Drink and eat less sodium, saturated fat, and added sugars.   Take care and seek immediate care sooner if you develop any concerns.   Dr. Ky Barban

## 2020-01-10 NOTE — Assessment & Plan Note (Signed)
Discussed results of lipid panel. Recommend lipid lowering diet and exercise, much discussion had regarding this, handout provided to patient. ASCVD risk 28%. Benefits and risks of statin therapy discussed, patient elects to start, Rx sent. Plan to recheck lipid panel at follow up.

## 2020-01-10 NOTE — Assessment & Plan Note (Signed)
Self-improving with resolution of URI/allergy symptoms. Exam wnl today. Recommend antihistamine if symptoms continue. F/u prn.

## 2020-01-16 ENCOUNTER — Ambulatory Visit: Payer: Self-pay | Admitting: *Deleted

## 2020-01-16 NOTE — Telephone Encounter (Signed)
Spoke to patient. Describes lower abdominal pain/cramps last night after taking crestor and finasteride together before bed. Wonders if had medication reaction. No difficulties with bowel or bladder, no vomiting or difficulties breathing. Only symptom remaining is dime-sized "hive" on arm. After discussion of common side effects, patient willing to try finasteride by itself tonight and will alert [place] if he experiences any reaction. Emergency precautions discussed. If proves intolerant to finasteride (had previous reaction to flomax), consider urology referral.

## 2020-01-16 NOTE — Telephone Encounter (Signed)
Patient reports he started taking proscar and crestor together the night of December 10th with food. He was up most of the night with lower abdominal pain.Denies leg/arm cramps. Stating he will not take either again. Explained he could take the proscar by itself to see if it would help-declined stating if he would have to take it the rest of his life then "no thanks". Informed him provider may recommend an OTC supplement for elevated cholesterol, he would be willing to try one if recommended.  Routing to provided for advice.  Reason for Disposition . Caller wants to use a complementary or alternative medicine  Answer Assessment - Initial Assessment Questions 1. NAME of MEDICATION: "What medicine are you calling about?"     proscar and crestor 2. QUESTION: "What is your question?" (e.g., medication refill, side effect)     Both meds caused stomach cramps 3. PRESCRIBING HCP: "Who prescribed it?" Reason: if prescribed by specialist, call should be referred to that group.     [facility] 4. SYMPTOMS: "Do you have any symptoms?"     Dime-sized red hive on rt upper arm 5. SEVERITY: If symptoms are present, ask "Are they mild, moderate or severe?"    mild 6. PREGNANCY:  "Is there any chance that you are pregnant?" "When was your last menstrual period?"     na  Protocols used: MEDICATION QUESTION CALL-A-AH

## 2020-01-16 NOTE — Telephone Encounter (Signed)
Please advise. Pt saw Dr Ky Barban 12/7

## 2020-02-26 DIAGNOSIS — H3581 Retinal edema: Secondary | ICD-10-CM | POA: Diagnosis not present

## 2020-02-26 DIAGNOSIS — H353221 Exudative age-related macular degeneration, left eye, with active choroidal neovascularization: Secondary | ICD-10-CM | POA: Diagnosis not present

## 2020-03-04 DIAGNOSIS — H353221 Exudative age-related macular degeneration, left eye, with active choroidal neovascularization: Secondary | ICD-10-CM | POA: Diagnosis not present

## 2020-03-11 ENCOUNTER — Other Ambulatory Visit: Payer: Self-pay

## 2020-03-11 MED ORDER — LEVOTHYROXINE SODIUM 125 MCG PO TABS
125.0000 ug | ORAL_TABLET | Freq: Every day | ORAL | 4 refills | Status: DC
Start: 2020-03-11 — End: 2020-06-09

## 2020-03-11 NOTE — Telephone Encounter (Signed)
Called pt; scheduled.

## 2020-03-11 NOTE — Telephone Encounter (Signed)
Patient last seen in December, previous [place] patient.

## 2020-03-12 ENCOUNTER — Other Ambulatory Visit: Payer: Self-pay

## 2020-03-12 DIAGNOSIS — I10 Essential (primary) hypertension: Secondary | ICD-10-CM

## 2020-03-12 MED ORDER — LOSARTAN POTASSIUM 100 MG PO TABS: 100.0000 mg | ORAL_TABLET | Freq: Every day | ORAL | 0 refills | Status: DC

## 2020-03-12 NOTE — Telephone Encounter (Signed)
Patient was last seen on 01/10/20. Next appointment is 04/23/20.

## 2020-04-01 DIAGNOSIS — H353221 Exudative age-related macular degeneration, left eye, with active choroidal neovascularization: Secondary | ICD-10-CM | POA: Diagnosis not present

## 2020-04-23 ENCOUNTER — Other Ambulatory Visit: Payer: Self-pay

## 2020-04-23 ENCOUNTER — Encounter: Payer: Self-pay | Admitting: Internal Medicine

## 2020-04-23 ENCOUNTER — Ambulatory Visit (INDEPENDENT_AMBULATORY_CARE_PROVIDER_SITE_OTHER): Payer: Medicare Other | Admitting: Internal Medicine

## 2020-04-23 VITALS — BP 126/87 | HR 66 | Temp 98.1°F | Ht 66.54 in | Wt 210.8 lb

## 2020-04-23 DIAGNOSIS — Z125 Encounter for screening for malignant neoplasm of prostate: Secondary | ICD-10-CM | POA: Diagnosis not present

## 2020-04-23 DIAGNOSIS — E039 Hypothyroidism, unspecified: Secondary | ICD-10-CM

## 2020-04-23 DIAGNOSIS — N3943 Post-void dribbling: Secondary | ICD-10-CM | POA: Diagnosis not present

## 2020-04-23 DIAGNOSIS — N401 Enlarged prostate with lower urinary tract symptoms: Secondary | ICD-10-CM

## 2020-04-23 DIAGNOSIS — Z1211 Encounter for screening for malignant neoplasm of colon: Secondary | ICD-10-CM

## 2020-04-23 DIAGNOSIS — R7303 Prediabetes: Secondary | ICD-10-CM | POA: Diagnosis not present

## 2020-04-23 MED ORDER — FLUTICASONE PROPIONATE 50 MCG/ACT NA SUSP: 2.0000 | Freq: Every day | NASAL | 6 refills | Status: DC

## 2020-04-23 NOTE — Progress Notes (Signed)
BP 126/87   Pulse 66   Temp 98.1 F (36.7 C) (Oral)   Ht 5' 6.54" (1.69 m)   Wt 210 lb 12.8 oz (95.6 kg)   SpO2 96%   BMI 33.48 kg/m    Subjective:    Patient ID: Joshua [name], male    DOB: 03-Feb-1950, 70 y.o.   MRN: [id number]  HPI: Joshua [name] is a 70 y.o. male  Hypertension This is a chronic problem. The current episode started more than 1 year ago. The problem is uncontrolled. Pertinent negatives include no malaise/fatigue, neck pain, palpitations or peripheral edema. Identifiable causes of hypertension include a thyroid problem.  Benign Prostatic Hypertrophy This is a chronic problem. Irritative symptoms include frequency and urgency. Pertinent negatives include no chills, dysuria, hematuria, hesitancy, nausea or vomiting.  Thyroid Problem Presents for follow-up visit. Patient reports no anxiety, cold intolerance, constipation, depressed mood, diaphoresis, diarrhea, dry skin, hoarse voice, leg swelling, menstrual problem, nail problem, palpitations, tremors or visual change.  Urinary Frequency  This is a chronic (was on tamsulosin had hvies per allergies.) problem. Episode onset: has nocturia x 2 times. during the day every hourly. Associated symptoms include frequency and urgency. Pertinent negatives include no chills, discharge, flank pain, hematuria, hesitancy, nausea, possible pregnancy or vomiting.    Chief Complaint  Patient presents with  . Hypertension  . Benign Prostatic Hypertrophy    Relevant past medical, surgical, family and social history reviewed and updated as indicated. Interim medical history since our last visit reviewed. Allergies and medications reviewed and updated.  Review of Systems  Constitutional: Negative for chills, diaphoresis and malaise/fatigue.  HENT: Negative for hoarse voice.   Cardiovascular: Negative for palpitations.  Gastrointestinal: Negative for constipation, diarrhea, nausea and vomiting.  Endocrine: Negative for cold  intolerance.  Genitourinary: Positive for frequency and urgency. Negative for difficulty urinating, dysuria, enuresis, flank pain, hematuria, hesitancy, menstrual problem, penile discharge, scrotal swelling and testicular pain.  Musculoskeletal: Negative for neck pain.  Neurological: Negative for tremors.  Psychiatric/Behavioral: The patient is not nervous/anxious.     Per HPI unless specifically indicated above     Objective:    BP 126/87   Pulse 66   Temp 98.1 F (36.7 C) (Oral)   Ht 5' 6.54" (1.69 m)   Wt 210 lb 12.8 oz (95.6 kg)   SpO2 96%   BMI 33.48 kg/m   Wt Readings from Last 3 Encounters:  04/23/20 210 lb 12.8 oz (95.6 kg)  01/10/20 210 lb 4 oz (95.4 kg)  01/08/20 211 lb 2 oz (95.8 kg)    Physical Exam Vitals and nursing note reviewed.  Constitutional:      General: He is not in acute distress.    Appearance: Normal appearance. He is not ill-appearing or diaphoretic.  HENT:     Head: Normocephalic and atraumatic.     Right Ear: Tympanic membrane and external ear normal. There is no impacted cerumen.     Left Ear: External ear normal.     Nose: No congestion or rhinorrhea.     Mouth/Throat:     Pharynx: No oropharyngeal exudate or posterior oropharyngeal erythema.  Eyes:     Conjunctiva/sclera: Conjunctivae normal.     Pupils: Pupils are equal, round, and reactive to light.  Cardiovascular:     Rate and Rhythm: Normal rate and regular rhythm.     Heart sounds: No murmur heard. No friction rub. No gallop.   Pulmonary:     Effort: No respiratory distress.  Breath sounds: No stridor. No wheezing or rhonchi.  Chest:     Chest wall: No tenderness.  Abdominal:     General: Abdomen is flat. Bowel sounds are normal.     Palpations: Abdomen is soft. There is no mass.     Tenderness: There is no abdominal tenderness.  Musculoskeletal:     Cervical back: Normal range of motion and neck supple. No rigidity or tenderness.     Left lower leg: No edema.  Skin:     General: Skin is warm and dry.  Neurological:     Mental Status: He is alert.     Results for orders placed or performed in visit on 01/08/20  B12  Result Value Ref Range   Vitamin B-12 691 232 - 1,245 pg/mL  Basic Metabolic Panel (BMET)  Result Value Ref Range   Glucose 86 65 - 99 mg/dL   BUN 16 8 - 27 mg/dL   Creatinine, Ser 1.04 0.76 - 1.27 mg/dL   GFR calc non Af Amer 72 >59 mL/min/1.73   GFR calc Af Amer 83 >59 mL/min/1.73   BUN/Creatinine Ratio 15 10 - 24   Sodium 136 134 - 144 mmol/L   Potassium 4.5 3.5 - 5.2 mmol/L   Chloride 100 96 - 106 mmol/L   CO2 23 20 - 29 mmol/L   Calcium 9.8 8.6 - 10.2 mg/dL  Lipid panel  Result Value Ref Range   Cholesterol, Total 169 100 - 199 mg/dL   Triglycerides 151 (H) 0 - 149 mg/dL   HDL 47 >39 mg/dL   VLDL Cholesterol Cal 26 5 - 40 mg/dL   LDL Chol Calc (NIH) 96 0 - 99 mg/dL   Chol/HDL Ratio 3.6 0.0 - 5.0 ratio  TSH  Result Value Ref Range   TSH 1.610 0.450 - 4.500 uIU/mL      Assessment & Plan:  1. Hypothyroidism will check TSH / FT4  HYPOTHYROIDISM PLEASE TAKE YOUR THYROID MEDICATION FIRST THING IN THE MORNING WHILST FASTING.  NO MEDICATION/ FOOD FOR AN HOUR AFTER INGESTING THYROID PILLS.   2. HTN is on cozaar 100 mg  HTN :  Continue current meds.  Medication compliance emphasised. pt advised to keep Bp logs. Pt verbalised understanding of the same. Pt to have a low salt diet . Exercise to reach a goal of at least 150 mins a week.  lifestyle modifications explained and pt understands importance of the above.   3. Hyperlipidemia: recheck FLP, check LFT's work on diet, SE of meds explained to pt. low fat and high fiber diet explained to pt.  4. BPH was on finesteride for such will refer to urology   5. Allergic rhinitis : Will start pt on flonase 2 sprays in each nostril at night.  Problem List Items Addressed This Visit   None      Follow up plan: No follow-ups on file.  Health Maintenance : PSA : 2021 Cscope  : 2016 Pneumonia vaccine :prevanar and pneumovax

## 2020-04-24 NOTE — Progress Notes (Signed)
04/25/2020 10:59 AM   [name] [birth year] [id number]  Referring provider: Charlynne Cousins, MD [address] [place],   [address]  Chief Complaint  Patient presents with  . New Patient (Initial Visit)    BPH with post void dribbling   Urological history: 1. Urethral stricture -per patient several years ago -cysto 2017 - NED  2. BPH with LU TS -PSA 2.5 in 10/2019 -I PSS 21/5 -PVR 0 mL  3. ED -SHIM 5 -contributing factors of age, hypothyroidism, HLD and HTN   HPI: Joshua [name] is a 70 y.o. male who presents today for symptoms of BPH and post-void dribbling as recommended by his PCP.    He is experiencing frequency, urgency and post-void dribbling.  These symptoms have been present for a number of years.    Patient denies any modifying or aggravating factors.  Patient denies any gross hematuria, dysuria or suprapubic/flank pain.  Patient denies any fevers, chills, nausea or vomiting.       IPSS    Row Name 04/25/20 1000         International Prostate Symptom Score   How often have you had the sensation of not emptying your bladder? Less than 1 in 5     How often have you had to urinate less than every two hours? Almost always     How often have you found you stopped and started again several times when you urinated? More than half the time     How often have you found it difficult to postpone urination? More than half the time     How often have you had a weak urinary stream? About half the time     How often have you had to strain to start urination? Less than 1 in 5 times     How many times did you typically get up at night to urinate? 3 Times     Total IPSS Score 21           Quality of Life due to urinary symptoms   If you were to spend the rest of your life with your urinary condition just the way it is now how would you feel about that? Unhappy            Score:  1-7 Mild 8-19 Moderate 20-35 Severe   SHIM    Row Name 04/25/20 1023          SHIM: Over the last 6 months:   How do you rate your confidence that you could get and keep an erection? Very Low     When you had erections with sexual stimulation, how often were your erections hard enough for penetration (entering your partner)? Almost Never or Never     During sexual intercourse, how often were you able to maintain your erection after you had penetrated (entered) your partner? Almost Never or Never     During sexual intercourse, how difficult was it to maintain your erection to completion of intercourse? Extremely Difficult     When you attempted sexual intercourse, how often was it satisfactory for you? Almost Never or Never           SHIM Total Score   SHIM 5            Score: 1-7 Severe ED 8-11 Moderate ED 12-16 Mild-Moderate ED 17-21 Mild ED 22-25 No ED  PMH: Past Medical History:  Diagnosis Date  . Allergy   . Amnesia  06/13/2014  . B12 deficiency 06/13/2014  . Benign essential HTN 10/30/2014  . BPH (benign prostatic hyperplasia)   . BPH with obstruction/lower urinary tract symptoms 05/15/2015  . Hypogonadism in male   . Hypothyroidism 10/30/2014    Surgical History: Past Surgical History:  Procedure Laterality Date  . COLONOSCOPY  March 2016  . ESOPHAGOGASTRODUODENOSCOPY ENDOSCOPY  March 2016  . EYE SURGERY Bilateral    cataracts  . KNEE SURGERY Right   . SHOULDER SURGERY Left   . TONSILLECTOMY      Home Medications:  Allergies as of 04/25/2020      Reactions   Aspirin Hives   Celery Oil Swelling   Flomax [tamsulosin Hcl] Hives   Hydromorphone Hives   Oysters [shellfish Allergy]    Amlodipine Besylate Swelling   Ankle edema   Latex Rash      Medication List       Accurate as of April 25, 2020 10:59 AM. If you have any questions, ask your nurse or doctor.        fluticasone 50 MCG/ACT nasal spray Commonly known as: FLONASE Place 2 sprays into both nostrils daily.   levothyroxine 125 MCG tablet Commonly known as:  SYNTHROID Take 1 tablet (125 mcg total) by mouth daily.   losartan 100 MG tablet Commonly known as: COZAAR Take 1 tablet (100 mg total) by mouth daily.   mirabegron ER 25 MG Tb24 tablet Commonly known as: MYRBETRIQ Take 1 tablet (25 mg total) by mouth daily. Started by: [name], PA-C   rosuvastatin 5 MG tablet Commonly known as: CRESTOR Take 1 tablet (5 mg total) by mouth daily.       Allergies:  Allergies  Allergen Reactions  . Aspirin Hives  . Celery Oil Swelling  . Flomax [Tamsulosin Hcl] Hives  . Hydromorphone Hives  . Oysters [Shellfish Allergy]   . Amlodipine Besylate Swelling    Ankle edema  . Latex Rash    Family History: Family History  Problem Relation Age of Onset  . Breast cancer Mother   . COPD Father   . Heart disease Father   . Prostate cancer Father     Social History:  reports that he has never smoked. He has never used smokeless tobacco. He reports current alcohol use. He reports that he does not use drugs.  ROS: Pertinent ROS in HPI  Physical Exam: BP (!) 133/95   Pulse 73   Ht 5\' 6"  (1.676 m)   Wt 210 lb (95.3 kg)   BMI 33.89 kg/m   Constitutional:  Well nourished. Alert and oriented, No acute distress. HEENT: [place] AT, mask in place.  Trachea midline Cardiovascular: No clubbing, cyanosis, or edema. Respiratory: Normal respiratory effort, no increased work of breathing. GU: No CVA tenderness.  No bladder fullness or masses.  Patient with uncircumcised phallus.  Foreskin easily retracted  Urethral meatus is patent.  No penile discharge. No penile lesions or rashes. Scrotum without lesions, cysts, rashes and/or edema.  Testicles are located scrotally bilaterally. No masses are appreciated in the testicles. Left and right epididymis are normal. Rectal: Patient with  normal sphincter tone. Anus and perineum without scarring or rashes. No rectal masses are appreciated. Prostate is approximately 45 grams, no nodules are appreciated. Seminal  vesicles could not be palpated Lymph: No cervical or inguinal adenopathy. Neurologic: Grossly intact, no focal deficits, moving all 4 extremities. Psychiatric: Normal mood and affect.  Laboratory Data: Lab Results  Component Value Date   WBC 7.3 02/20/2018  HGB 15.7 02/20/2018   HCT 45.9 02/20/2018   MCV 93 02/20/2018   PLT 178 02/20/2018    Lab Results  Component Value Date   CREATININE 1.04 01/08/2020     Lab Results  Component Value Date   HGBA1C 5.9 (H) 10/11/2019    Lab Results  Component Value Date   TSH 1.610 01/08/2020       Component Value Date/Time   CHOL 169 01/08/2020 0946   HDL 47 01/08/2020 0946   CHOLHDL 3.6 01/08/2020 0946   LDLCALC 96 01/08/2020 0946    Lab Results  Component Value Date   AST 25 07/09/2019   Lab Results  Component Value Date   ALT 20 07/09/2019    Urinalysis Component     Latest Ref Rng & Units 10/11/2019  Specific Gravity, UA     1.005 - 1.030 1.010  pH, UA     5.0 - 7.5 6.0  Color, UA     Yellow Yellow  Appearance Ur     Clear Clear  Leukocytes,UA     Negative Negative  Protein,UA     Negative/Trace Negative  Glucose, UA     Negative Negative  Ketones, UA     Negative Negative  RBC, UA     Negative Negative  Bilirubin, UA     Negative Negative  Urobilinogen, Ur     0.2 - 1.0 mg/dL 0.2  Nitrite, UA     Negative Negative  I have reviewed the labs.   Pertinent Imaging:  Results for KELLIE, CHISOLM (MRN [id number]) as of 04/25/2020 10:54  Ref. Range 04/25/2020 10:30  Scan Result Unknown 0ML    Assessment & Plan:    1. BPH with LU TS -prostate exam benign.  Will start Myrbetriq 25 mg daily to see if this curbs his frequency/urgency/nocturia.  If the samples are not effective, we will repeat the cystoscopy to rule out stricture disease and/or bladder cancer  2. Post-void dribbling -prostate lobes may be restricting urinary flow -will reassess once he returns  3. ED -wife is bed ridden, so he is  not sexually active  Return in about 3 weeks (around 05/16/2020) for IPSS and PVR.  These notes generated with voice recognition software. I apologize for typographical errors.  [name], PA-C  [facility] [address]  [place], [place] [address] [phone]

## 2020-04-25 ENCOUNTER — Ambulatory Visit (INDEPENDENT_AMBULATORY_CARE_PROVIDER_SITE_OTHER): Payer: Medicare Other | Admitting: Urology

## 2020-04-25 ENCOUNTER — Other Ambulatory Visit: Payer: Self-pay

## 2020-04-25 ENCOUNTER — Encounter: Payer: Self-pay | Admitting: Urology

## 2020-04-25 VITALS — BP 133/95 | HR 73 | Ht 66.0 in | Wt 210.0 lb

## 2020-04-25 DIAGNOSIS — N529 Male erectile dysfunction, unspecified: Secondary | ICD-10-CM

## 2020-04-25 DIAGNOSIS — N3943 Post-void dribbling: Secondary | ICD-10-CM | POA: Diagnosis not present

## 2020-04-25 DIAGNOSIS — N138 Other obstructive and reflux uropathy: Secondary | ICD-10-CM | POA: Diagnosis not present

## 2020-04-25 DIAGNOSIS — N401 Enlarged prostate with lower urinary tract symptoms: Secondary | ICD-10-CM | POA: Diagnosis not present

## 2020-04-25 LAB — BLADDER SCAN AMB NON-IMAGING

## 2020-04-25 MED ORDER — MIRABEGRON ER 25 MG PO TB24: 25.0000 mg | ORAL_TABLET | Freq: Every day | ORAL | 0 refills | Status: DC

## 2020-04-29 ENCOUNTER — Telehealth: Payer: Self-pay

## 2020-04-29 DIAGNOSIS — H353221 Exudative age-related macular degeneration, left eye, with active choroidal neovascularization: Secondary | ICD-10-CM | POA: Diagnosis not present

## 2020-04-29 NOTE — Telephone Encounter (Signed)
Copied from Geraldine [phone]. Topic: General - Other >> Apr 29, 2020 11:07 AM Pawlus, Brayton Layman A wrote: Reason for CRM: Pt wanted to go over his lab results, please call back.

## 2020-04-29 NOTE — Telephone Encounter (Signed)
Left message for patient to give our office a call back at his earliest convenience. According patient has not have labs done since December. If patient wants to come in to have labs completed he would be able to come in early and fasting.

## 2020-04-30 NOTE — Telephone Encounter (Signed)
Called patient to discuss lab results, no answer, left a voicemail for patient to return my call.  ?  ? ? ?

## 2020-05-01 NOTE — Telephone Encounter (Signed)
Called and left a message for patient letting him know that I will be closing this encounter due to multiple failed attempts at reaching him, and that if he still needs something to give [place] a call back.

## 2020-05-14 ENCOUNTER — Other Ambulatory Visit: Payer: Medicare Other

## 2020-05-14 NOTE — Progress Notes (Signed)
05/15/2020 11:40 AM   Joshua [name] March 06, 1950 [id number]  Referring provider: Charlynne Cousins, MD [address] [place],  [place] [address]  Chief Complaint  Patient presents with  . Benign Prostatic Hypertrophy   Urological history: 1. Urethral stricture -per patient several years ago -cysto 2017 - NED  2. BPH with LU TS -PSA 2.5 in 10/2019 -I PSS 13/2 -PVR 10 mL  3. ED -SHIM 5 -contributing factors of age, hypothyroidism, HLD and HTN   HPI: Joshua [name] is a 70 y.o. male who presents today for follow up after a three week trial of Myrbetriq 25 mg daily.      He has noticed that his frequency and ability to postpone urination have improved.  He is still having postvoid dribbling.  Patient denies any modifying or aggravating factors.  Patient denies any gross hematuria, dysuria or suprapubic/flank pain.  Patient denies any fevers, chills, nausea or vomiting.     IPSS    Row Name 05/15/20 1100         International Prostate Symptom Score   How often have you had the sensation of not emptying your bladder? About half the time     How often have you had to urinate less than every two hours? More than half the time     How often have you found you stopped and started again several times when you urinated? Not at All     How often have you found it difficult to postpone urination? More than half the time     How often have you had a weak urinary stream? Not at All     How often have you had to strain to start urination? Not at All     How many times did you typically get up at night to urinate? 2 Times     Total IPSS Score 13           Quality of Life due to urinary symptoms   If you were to spend the rest of your life with your urinary condition just the way it is now how would you feel about that? Mostly Satisfied            Score:  1-7 Mild 8-19 Moderate 20-35 Severe      PMH: Past Medical History:  Diagnosis Date  . Allergy   . Amnesia 06/13/2014   . B12 deficiency 06/13/2014  . Benign essential HTN 10/30/2014  . BPH (benign prostatic hyperplasia)   . BPH with obstruction/lower urinary tract symptoms 05/15/2015  . Hypogonadism in male   . Hypothyroidism 10/30/2014    Surgical History: Past Surgical History:  Procedure Laterality Date  . COLONOSCOPY  March 2016  . ESOPHAGOGASTRODUODENOSCOPY ENDOSCOPY  March 2016  . EYE SURGERY Bilateral    cataracts  . KNEE SURGERY Right   . SHOULDER SURGERY Left   . TONSILLECTOMY      Home Medications:  Allergies as of 05/15/2020      Reactions   Aspirin Hives   Celery Oil Swelling   Flomax [tamsulosin Hcl] Hives   Hydromorphone Hives   Oysters [shellfish Allergy]    Amlodipine Besylate Swelling   Ankle edema   Latex Rash      Medication List       Accurate as of May 15, 2020 11:40 AM. If you have any questions, ask your nurse or doctor.        fluticasone 50 MCG/ACT nasal spray Commonly known as:  FLONASE Place 2 sprays into both nostrils daily.   levothyroxine 125 MCG tablet Commonly known as: SYNTHROID Take 1 tablet (125 mcg total) by mouth daily.   losartan 100 MG tablet Commonly known as: COZAAR Take 1 tablet (100 mg total) by mouth daily.   mirabegron ER 25 MG Tb24 tablet Commonly known as: MYRBETRIQ Take 1 tablet (25 mg total) by mouth daily.   rosuvastatin 5 MG tablet Commonly known as: CRESTOR Take 1 tablet (5 mg total) by mouth daily.       Allergies:  Allergies  Allergen Reactions  . Aspirin Hives  . Celery Oil Swelling  . Flomax [Tamsulosin Hcl] Hives  . Hydromorphone Hives  . Oysters [Shellfish Allergy]   . Amlodipine Besylate Swelling    Ankle edema  . Latex Rash    Family History: Family History  Problem Relation Age of Onset  . Breast cancer Mother   . COPD Father   . Heart disease Father   . Prostate cancer Father     Social History:  reports that he has never smoked. He has never used smokeless tobacco. He reports current  alcohol use. He reports that he does not use drugs.  ROS: Pertinent ROS in HPI  Physical Exam: BP 125/77   Pulse 69   Ht 5\' 6"  (1.676 m)   Wt 210 lb (95.3 kg)   BMI 33.89 kg/m   Constitutional:  Well nourished. Alert and oriented, No acute distress. HEENT: [place] AT, mask in place  Trachea midline Cardiovascular: No clubbing, cyanosis, or edema. Respiratory: Normal respiratory effort, no increased work of breathing. Neurologic: Grossly intact, no focal deficits, moving all 4 extremities. Psychiatric: Normal mood and affect.  Laboratory Data: No recent labs    Pertinent Imaging:  Results for Joshua, [name] (MRN [id number]) as of 05/15/2020 11:35  Ref. Range 05/15/2020 11:11  Scan Result Unknown 10     Assessment & Plan:    1. BPH with LU TS -He will continue Myrbetriq 25 mg daily for another 4 weeks to see if his symptoms continue to improve -He will contact me with the results  2. Post-void dribbling -prostate lobes may be restricting urinary flow -I explained to patient that he may need to pursue cystoscopic if the postvoid dribbling did not improve with the Myrbetriq   Return for patient to call .  These notes generated with voice recognition software. I apologize for typographical errors.  Joshua Council, PA-C  [facility] [address]  [place], [place] [address] [phone]

## 2020-05-15 ENCOUNTER — Encounter: Payer: Self-pay | Admitting: Urology

## 2020-05-15 ENCOUNTER — Ambulatory Visit (INDEPENDENT_AMBULATORY_CARE_PROVIDER_SITE_OTHER): Payer: Medicare Other | Admitting: Urology

## 2020-05-15 ENCOUNTER — Telehealth (INDEPENDENT_AMBULATORY_CARE_PROVIDER_SITE_OTHER): Payer: Self-pay | Admitting: Gastroenterology

## 2020-05-15 ENCOUNTER — Other Ambulatory Visit: Payer: Self-pay

## 2020-05-15 VITALS — BP 125/77 | HR 69 | Ht 66.0 in | Wt 210.0 lb

## 2020-05-15 DIAGNOSIS — N138 Other obstructive and reflux uropathy: Secondary | ICD-10-CM | POA: Diagnosis not present

## 2020-05-15 DIAGNOSIS — Z1211 Encounter for screening for malignant neoplasm of colon: Secondary | ICD-10-CM

## 2020-05-15 DIAGNOSIS — N3943 Post-void dribbling: Secondary | ICD-10-CM | POA: Diagnosis not present

## 2020-05-15 DIAGNOSIS — N401 Enlarged prostate with lower urinary tract symptoms: Secondary | ICD-10-CM

## 2020-05-15 LAB — BLADDER SCAN AMB NON-IMAGING: Scan Result: 10

## 2020-05-15 MED ORDER — NA SULFATE-K SULFATE-MG SULF 17.5-3.13-1.6 GM/177ML PO SOLN: 1.0000 | Freq: Once | ORAL | 0 refills | Status: AC

## 2020-05-15 NOTE — Progress Notes (Signed)
Gastroenterology Pre-Procedure Review  Request Date: Thursday 05/29/20 Requesting Physician: Dr. Marius Ditch  PATIENT REVIEW QUESTIONS: The patient responded to the following health history questions as indicated:    1. Are you having any GI issues? no 2. Do you have a personal history of Polyps? no 3. Do you have a family history of Colon Cancer or Polyps? no 4. Diabetes Mellitus? no 5. Joint replacements in the past 12 months?no 6. Major health problems in the past 3 months?no 7. Any artificial heart valves, MVP, or defibrillator?no    MEDICATIONS & ALLERGIES:    Patient reports the following regarding taking any anticoagulation/antiplatelet therapy:   Plavix, Coumadin, Eliquis, Xarelto, Lovenox, Pradaxa, Brilinta, or Effient? no Aspirin? no  Patient confirms/reports the following medications:  Current Outpatient Medications  Medication Sig Dispense Refill  . fluticasone (FLONASE) 50 MCG/ACT nasal spray Place 2 sprays into both nostrils daily. 16 g 6  . levothyroxine (SYNTHROID) 125 MCG tablet Take 1 tablet (125 mcg total) by mouth daily. 90 tablet 4  . losartan (COZAAR) 100 MG tablet Take 1 tablet (100 mg total) by mouth daily. 90 tablet 0  . mirabegron ER (MYRBETRIQ) 25 MG TB24 tablet Take 1 tablet (25 mg total) by mouth daily. 28 tablet 0  . rosuvastatin (CRESTOR) 5 MG tablet Take 1 tablet (5 mg total) by mouth daily. 90 tablet 3   No current facility-administered medications for this visit.    Patient confirms/reports the following allergies:  Allergies  Allergen Reactions  . Aspirin Hives  . Celery Oil Swelling  . Flomax [Tamsulosin Hcl] Hives  . Hydromorphone Hives  . Oysters [Shellfish Allergy]   . Amlodipine Besylate Swelling    Ankle edema  . Latex Rash    No orders of the defined types were placed in this encounter.   AUTHORIZATION INFORMATION Primary Insurance: 1D#: Group #:  Secondary Insurance: 1D#: Group #:  SCHEDULE INFORMATION: Date:  05/29/20 Time: Location: [place]

## 2020-05-21 ENCOUNTER — Ambulatory Visit (INDEPENDENT_AMBULATORY_CARE_PROVIDER_SITE_OTHER): Payer: Medicare Other | Admitting: Internal Medicine

## 2020-05-21 ENCOUNTER — Encounter: Payer: Self-pay | Admitting: Internal Medicine

## 2020-05-21 ENCOUNTER — Other Ambulatory Visit: Payer: Self-pay

## 2020-05-21 VITALS — BP 124/82 | HR 67 | Temp 98.2°F | Ht 66.38 in | Wt 211.4 lb

## 2020-05-21 DIAGNOSIS — R7303 Prediabetes: Secondary | ICD-10-CM

## 2020-05-21 DIAGNOSIS — E669 Obesity, unspecified: Secondary | ICD-10-CM

## 2020-05-21 DIAGNOSIS — N3943 Post-void dribbling: Secondary | ICD-10-CM | POA: Diagnosis not present

## 2020-05-21 DIAGNOSIS — N401 Enlarged prostate with lower urinary tract symptoms: Secondary | ICD-10-CM | POA: Diagnosis not present

## 2020-05-21 DIAGNOSIS — E039 Hypothyroidism, unspecified: Secondary | ICD-10-CM | POA: Diagnosis not present

## 2020-05-21 LAB — BAYER DCA HB A1C WAIVED: HB A1C (BAYER DCA - WAIVED): 6 % (ref <7.0)

## 2020-05-21 NOTE — Progress Notes (Signed)
BP 124/82   Pulse 67   Temp 98.2 F (36.8 C) (Oral)   Ht 5' 6.38" (1.686 m)   Wt 211 lb 6.4 oz (95.9 kg)   SpO2 97%   BMI 33.73 kg/m    Subjective:    Patient ID: Joshua [name], male    DOB: [birth year], 70 y.o.   MRN: [id number]  HPI: Joshua [name] is a 70 y.o. male  Hypertension This is a chronic problem. Pertinent negatives include no chest pain, headaches, palpitations or shortness of breath. Identifiable causes of hypertension include a thyroid problem.  Hyperlipidemia Pertinent negatives include no chest pain, myalgias or shortness of breath.  Benign Prostatic Hypertrophy Irritative symptoms do not include frequency or urgency. Pertinent negatives include no chills, dysuria, hematuria, nausea or vomiting.  Erectile Dysfunction Irritative symptoms do not include frequency or urgency. Pertinent negatives include no chills, dysuria or hematuria.  Thyroid Problem Patient reports no cold intolerance, constipation, diarrhea, fatigue, heat intolerance, palpitations or tremors. His past medical history is significant for hyperlipidemia.    Chief Complaint  Patient presents with  . Hypertension  . Hyperlipidemia  . Hypothyroidism  . Benign Prostatic Hypertrophy  . Erectile Dysfunction  . Prediabetes    Relevant past medical, surgical, family and social history reviewed and updated as indicated. Interim medical history since our last visit reviewed. Allergies and medications reviewed and updated.  Review of Systems  Constitutional: Negative for activity change, appetite change, chills, fatigue and fever.  HENT: Negative for congestion, ear discharge, ear pain and facial swelling.   Eyes: Negative for pain, discharge and itching.  Respiratory: Negative for cough, chest tightness, shortness of breath and wheezing.   Cardiovascular: Negative for chest pain, palpitations and leg swelling.  Gastrointestinal: Negative for abdominal distention, abdominal pain, blood in  stool, constipation, diarrhea, nausea and vomiting.  Endocrine: Negative for cold intolerance, heat intolerance, polydipsia, polyphagia and polyuria.  Genitourinary: Negative for difficulty urinating, dysuria, flank pain, frequency, hematuria and urgency.  Musculoskeletal: Negative for arthralgias, gait problem, joint swelling and myalgias.  Skin: Negative for color change, rash and wound.  Neurological: Negative for dizziness, tremors, speech difficulty, weakness, light-headedness, numbness and headaches.  Hematological: Does not bruise/bleed easily.  Psychiatric/Behavioral: Negative for agitation, confusion, decreased concentration, sleep disturbance and suicidal ideas.    Per HPI unless specifically indicated above     Objective:    BP 124/82   Pulse 67   Temp 98.2 F (36.8 C) (Oral)   Ht 5' 6.38" (1.686 m)   Wt 211 lb 6.4 oz (95.9 kg)   SpO2 97%   BMI 33.73 kg/m   Wt Readings from Last 3 Encounters:  05/21/20 211 lb 6.4 oz (95.9 kg)  05/15/20 210 lb (95.3 kg)  04/25/20 210 lb (95.3 kg)    Physical Exam Vitals and nursing note reviewed.  Constitutional:      General: He is not in acute distress.    Appearance: Normal appearance. He is not ill-appearing or diaphoretic.  HENT:     Head: Normocephalic and atraumatic.     Right Ear: Tympanic membrane and external ear normal. There is no impacted cerumen.     Left Ear: External ear normal.     Nose: No congestion or rhinorrhea.     Mouth/Throat:     Pharynx: No oropharyngeal exudate or posterior oropharyngeal erythema.  Eyes:     Conjunctiva/sclera: Conjunctivae normal.     Pupils: Pupils are equal, round, and reactive to light.  Cardiovascular:  Rate and Rhythm: Normal rate and regular rhythm.     Heart sounds: No murmur heard. No friction rub. No gallop.   Pulmonary:     Effort: No respiratory distress.     Breath sounds: No stridor. No wheezing or rhonchi.  Chest:     Chest wall: No tenderness.  Abdominal:      General: Abdomen is flat. Bowel sounds are normal.     Palpations: Abdomen is soft. There is no mass.     Tenderness: There is no abdominal tenderness.  Musculoskeletal:     Cervical back: Normal range of motion and neck supple. No rigidity or tenderness.     Left lower leg: No edema.  Skin:    General: Skin is warm and dry.  Neurological:     Mental Status: He is alert.     Results for orders placed or performed in visit on 05/15/20  Bladder Scan (Post Void Residual) in office  Result Value Ref Range   Scan Result 10         Current Outpatient Medications:  .  fluticasone (FLONASE) 50 MCG/ACT nasal spray, Place 2 sprays into both nostrils daily., Disp: 16 g, Rfl: 6 .  levothyroxine (SYNTHROID) 125 MCG tablet, Take 1 tablet (125 mcg total) by mouth daily., Disp: 90 tablet, Rfl: 4 .  losartan (COZAAR) 100 MG tablet, Take 1 tablet (100 mg total) by mouth daily., Disp: 90 tablet, Rfl: 0 .  mirabegron ER (MYRBETRIQ) 25 MG TB24 tablet, Take 1 tablet (25 mg total) by mouth daily., Disp: 28 tablet, Rfl: 0 .  Multiple Vitamins-Minerals (EYE VITAMINS PO), Take by mouth., Disp: , Rfl:  .  rosuvastatin (CRESTOR) 5 MG tablet, Take 1 tablet (5 mg total) by mouth daily. (Patient not taking: Reported on 05/21/2020), Disp: 90 tablet, Rfl: 3    Assessment & Plan:   Assessment & Plan:  1. Hypothyroidism will check TSH / FT4 Pt is on 125 mcg of synthroid for such  advcied to [place] [facility]IL[facility] FASTING.  NO MEDICATION/ FOOD FOR AN HOUR AFTER INGESTING THYROID PILLS.   2. HTN is on cozaar 100 mg  HTN :  Continue current meds.  Medication compliance emphasised. pt advised to keep Bp logs. Pt verbalised understanding of the same. Pt to have a low salt diet . Exercise to reach a goal of at least 150 mins a week.  lifestyle modifications explained and pt understands importance of the above.   3. Hyperlipidemia: recheck FLP, check LFT's work on  diet, SE of meds explained to pt. low fat and high fiber diet explained to pt.  4. BPH was on finesteride for such will refer to urology   5. Allergic rhinitis : Will start pt on flonase 2 sprays in each nostril at night.    Problem List Items Addressed This Visit   None      Follow up plan: No follow-ups on file.

## 2020-05-22 LAB — COMPREHENSIVE METABOLIC PANEL
ALT: 18 IU/L (ref 0–44)
AST: 20 IU/L (ref 0–40)
Albumin/Globulin Ratio: 1.3 (ref 1.2–2.2)
Albumin: 3.9 g/dL (ref 3.7–4.7)
Alkaline Phosphatase: 84 IU/L (ref 44–121)
BUN/Creatinine Ratio: 15 (ref 10–24)
BUN: 15 mg/dL (ref 8–27)
Bilirubin Total: 0.8 mg/dL (ref 0.0–1.2)
CO2: 25 mmol/L (ref 20–29)
Calcium: 9.4 mg/dL (ref 8.6–10.2)
Chloride: 101 mmol/L (ref 96–106)
Creatinine, Ser: 0.97 mg/dL (ref 0.76–1.27)
Globulin, Total: 3 g/dL (ref 1.5–4.5)
Glucose: 92 mg/dL (ref 65–99)
Potassium: 4.3 mmol/L (ref 3.5–5.2)
Sodium: 141 mmol/L (ref 134–144)
Total Protein: 6.9 g/dL (ref 6.0–8.5)
eGFR: 83 mL/min/{1.73_m2} (ref >59)

## 2020-05-22 LAB — LIPID PANEL
Chol/HDL Ratio: 3.4 ratio (ref 0.0–5.0)
Cholesterol, Total: 164 mg/dL (ref 100–199)
HDL: 48 mg/dL (ref >39)
LDL Chol Calc (NIH): 97 mg/dL (ref 0–99)
Triglycerides: 103 mg/dL (ref 0–149)
VLDL Cholesterol Cal: 19 mg/dL (ref 5–40)

## 2020-05-22 LAB — CBC WITH DIFFERENTIAL/PLATELET
Basophils Absolute: 0 10*3/uL (ref 0.0–0.2)
Basos: 1 %
EOS (ABSOLUTE): 0.2 10*3/uL (ref 0.0–0.4)
Eos: 4 %
Hematocrit: 42.6 % (ref 37.5–51.0)
Hemoglobin: 14.8 g/dL (ref 13.0–17.7)
Immature Grans (Abs): 0 10*3/uL (ref 0.0–0.1)
Immature Granulocytes: 0 %
Lymphocytes Absolute: 1.6 10*3/uL (ref 0.7–3.1)
Lymphs: 26 %
MCH: 32.8 pg (ref 26.6–33.0)
MCHC: 34.7 g/dL (ref 31.5–35.7)
MCV: 95 fL (ref 79–97)
Monocytes Absolute: 0.6 10*3/uL (ref 0.1–0.9)
Monocytes: 9 %
Neutrophils Absolute: 3.8 10*3/uL (ref 1.4–7.0)
Neutrophils: 60 %
Platelets: 156 10*3/uL (ref 150–450)
RBC: 4.51 x10E6/uL (ref 4.14–5.80)
RDW: 12.4 % (ref 11.6–15.4)
WBC: 6.3 10*3/uL (ref 3.4–10.8)

## 2020-05-22 LAB — T4, FREE: Free T4: 1.55 ng/dL (ref 0.82–1.77)

## 2020-05-22 LAB — THYROID PANEL WITH TSH
Free Thyroxine Index: 2.3 (ref 1.2–4.9)
T3 Uptake Ratio: 29 % (ref 24–39)
T4, Total: 7.9 ug/dL (ref 4.5–12.0)
TSH: 0.803 u[IU]/mL (ref 0.450–4.500)

## 2020-05-22 LAB — PSA TOTAL+% FREE (SERIAL)
PSA, Free Pct: 19.2 %
PSA, Free: 0.5 ng/mL
Prostate Specific Ag, Serum: 2.6 ng/mL (ref 0.0–4.0)

## 2020-05-27 DIAGNOSIS — H353221 Exudative age-related macular degeneration, left eye, with active choroidal neovascularization: Secondary | ICD-10-CM | POA: Diagnosis not present

## 2020-05-28 ENCOUNTER — Encounter: Payer: Self-pay | Admitting: Gastroenterology

## 2020-05-29 ENCOUNTER — Ambulatory Visit: Payer: Medicare Other | Admitting: Anesthesiology

## 2020-05-29 ENCOUNTER — Encounter
Admission: RE | Disposition: A | Discharge status: Home / Self Care | Payer: Self-pay | Source: Home / Self Care | Attending: Gastroenterology

## 2020-05-29 ENCOUNTER — Other Ambulatory Visit: Payer: Self-pay

## 2020-05-29 ENCOUNTER — Encounter: Payer: Self-pay | Admitting: Gastroenterology

## 2020-05-29 ENCOUNTER — Ambulatory Visit
Admission: RE | Admit: 2020-05-29 | Discharge: 2020-05-29 | Disposition: A | Discharge status: Home / Self Care | Payer: Medicare Other | Attending: Gastroenterology | Admitting: Gastroenterology

## 2020-05-29 DIAGNOSIS — I10 Essential (primary) hypertension: Secondary | ICD-10-CM | POA: Diagnosis not present

## 2020-05-29 DIAGNOSIS — Z79899 Other long term (current) drug therapy: Secondary | ICD-10-CM | POA: Diagnosis not present

## 2020-05-29 DIAGNOSIS — Z825 Family history of asthma and other chronic lower respiratory diseases: Secondary | ICD-10-CM | POA: Diagnosis not present

## 2020-05-29 DIAGNOSIS — D122 Benign neoplasm of ascending colon: Secondary | ICD-10-CM | POA: Insufficient documentation

## 2020-05-29 DIAGNOSIS — D126 Benign neoplasm of colon, unspecified: Secondary | ICD-10-CM | POA: Diagnosis not present

## 2020-05-29 DIAGNOSIS — Z91013 Allergy to seafood: Secondary | ICD-10-CM | POA: Insufficient documentation

## 2020-05-29 DIAGNOSIS — Z8249 Family history of ischemic heart disease and other diseases of the circulatory system: Secondary | ICD-10-CM | POA: Insufficient documentation

## 2020-05-29 DIAGNOSIS — E039 Hypothyroidism, unspecified: Secondary | ICD-10-CM | POA: Insufficient documentation

## 2020-05-29 DIAGNOSIS — Z886 Allergy status to analgesic agent status: Secondary | ICD-10-CM | POA: Diagnosis not present

## 2020-05-29 DIAGNOSIS — K635 Polyp of colon: Secondary | ICD-10-CM

## 2020-05-29 DIAGNOSIS — Z1211 Encounter for screening for malignant neoplasm of colon: Secondary | ICD-10-CM | POA: Diagnosis not present

## 2020-05-29 DIAGNOSIS — Z7982 Long term (current) use of aspirin: Secondary | ICD-10-CM | POA: Insufficient documentation

## 2020-05-29 DIAGNOSIS — D124 Benign neoplasm of descending colon: Secondary | ICD-10-CM | POA: Insufficient documentation

## 2020-05-29 DIAGNOSIS — Z803 Family history of malignant neoplasm of breast: Secondary | ICD-10-CM | POA: Insufficient documentation

## 2020-05-29 DIAGNOSIS — K5730 Diverticulosis of large intestine without perforation or abscess without bleeding: Secondary | ICD-10-CM | POA: Diagnosis not present

## 2020-05-29 DIAGNOSIS — Z888 Allergy status to other drugs, medicaments and biological substances status: Secondary | ICD-10-CM | POA: Diagnosis not present

## 2020-05-29 DIAGNOSIS — Z91018 Allergy to other foods: Secondary | ICD-10-CM | POA: Insufficient documentation

## 2020-05-29 DIAGNOSIS — Z91040 Latex allergy status: Secondary | ICD-10-CM | POA: Insufficient documentation

## 2020-05-29 HISTORY — PX: COLONOSCOPY WITH PROPOFOL: SHX5780

## 2020-05-29 HISTORY — DX: Personal history of urinary calculi: Z87.442

## 2020-05-29 SURGERY — COLONOSCOPY WITH PROPOFOL
Anesthesia: General

## 2020-05-29 MED ORDER — SODIUM CHLORIDE (PF) 0.9 % IJ SOLN
INTRAMUSCULAR | Status: DC | PRN
  Administered 2020-05-29: 8 mL

## 2020-05-29 MED ORDER — PROPOFOL 500 MG/50ML IV EMUL
INTRAVENOUS | Status: AC
  Filled 2020-05-29: qty 50

## 2020-05-29 MED ORDER — PROPOFOL 10 MG/ML IV BOLUS
INTRAVENOUS | Status: DC | PRN
  Administered 2020-05-29: 40 mg via INTRAVENOUS
  Administered 2020-05-29: 20 mg via INTRAVENOUS

## 2020-05-29 MED ORDER — LIDOCAINE HCL (CARDIAC) PF 100 MG/5ML IV SOSY
PREFILLED_SYRINGE | INTRAVENOUS | Status: DC | PRN
  Administered 2020-05-29: 50 mg via INTRAVENOUS

## 2020-05-29 MED ORDER — PROPOFOL 500 MG/50ML IV EMUL
INTRAVENOUS | Status: DC | PRN
  Administered 2020-05-29: 125 ug/kg/min via INTRAVENOUS

## 2020-05-29 MED ORDER — SODIUM CHLORIDE 0.9 % IV SOLN: INTRAVENOUS | Status: DC

## 2020-05-29 NOTE — Anesthesia Preprocedure Evaluation (Addendum)
Anesthesia Evaluation  Patient identified by MRN, date of birth, ID band Patient awake    Reviewed: Allergy & Precautions, H&P , NPO status , Patient's Chart, lab work & pertinent test results  History of Anesthesia Complications Negative for: history of anesthetic complications  Airway Mallampati: III  TM Distance: >3 FB     Dental  (+) Teeth Intact   Pulmonary sleep apnea and Continuous Positive Airway Pressure Ventilation , neg COPD,    breath sounds clear to auscultation       Cardiovascular hypertension, (-) angina(-) Past MI and (-) Cardiac Stents (-) dysrhythmias  Rhythm:regular Rate:Normal     Neuro/Psych  Headaches, negative psych ROS   GI/Hepatic negative GI ROS, Neg liver ROS,   Endo/Other  Hypothyroidism   Renal/GU negative Renal ROS  negative genitourinary   Musculoskeletal   Abdominal   Peds  Hematology negative hematology ROS (+)   Anesthesia Other Findings Past Medical History: No date: Allergy 06/13/2014: Amnesia 06/13/2014: B12 deficiency 10/30/2014: Benign essential HTN No date: BPH (benign prostatic hyperplasia) 05/15/2015: BPH with obstruction/lower urinary tract symptoms No date: History of kidney stones No date: Hypogonadism in male 10/30/2014: Hypothyroidism  Past Surgical History: March 2016: COLONOSCOPY March 2016: ESOPHAGOGASTRODUODENOSCOPY ENDOSCOPY No date: EYE SURGERY; Bilateral     Comment:  cataracts No date: KNEE SURGERY; Right No date: SHOULDER SURGERY; Left No date: TONSILLECTOMY  BMI    Body Mass Index: 33.05 kg/m      Reproductive/Obstetrics negative OB ROS                           Anesthesia Physical Anesthesia Plan  ASA: II  Anesthesia Plan: General   Post-op Pain Management:    Induction:   PONV Risk Score and Plan: Propofol infusion and TIVA  Airway Management Planned: Nasal Cannula  Additional Equipment:   Intra-op Plan:    Post-operative Plan:   Informed Consent: I have reviewed the patients History and Physical, chart, labs and discussed the procedure including the risks, benefits and alternatives for the proposed anesthesia with the patient or authorized representative who has indicated his/her understanding and acceptance.     Dental Advisory Given  Plan Discussed with: Anesthesiologist, CRNA and Surgeon  Anesthesia Plan Comments:         Anesthesia Quick Evaluation

## 2020-05-29 NOTE — Transfer of Care (Signed)
Immediate Anesthesia Transfer of Care Note  Patient: Joshua [name]  Procedure(s) Performed: COLONOSCOPY WITH PROPOFOL (N/A )  Patient Location: PACU and Endoscopy Unit  Anesthesia Type:General  Level of Consciousness: drowsy and patient cooperative  Airway & Oxygen Therapy: Patient Spontanous Breathing  Post-op Assessment: Report given to RN and Post -op Vital signs reviewed and stable  Post vital signs: Reviewed and stable  Last Vitals:  Vitals Value Taken Time  BP 82/62 05/29/20 0945  Temp 36.3 C 05/29/20 0941  Pulse 73 05/29/20 0946  Resp 17 05/29/20 0946  SpO2 98 % 05/29/20 0946  Vitals shown include unvalidated device data.  Last Pain:  Vitals:   05/29/20 0941  TempSrc: Temporal  PainSc: 0-No pain         Complications: No complications documented.

## 2020-05-29 NOTE — Anesthesia Postprocedure Evaluation (Signed)
Anesthesia Post Note  Patient: Joshua [name]  Procedure(s) Performed: COLONOSCOPY WITH PROPOFOL (N/A )  Patient location during evaluation: PACU Anesthesia Type: General Level of consciousness: awake and alert Pain management: pain level controlled Vital Signs Assessment: post-procedure vital signs reviewed and stable Respiratory status: spontaneous breathing, nonlabored ventilation and respiratory function stable Cardiovascular status: blood pressure returned to baseline and stable Postop Assessment: no apparent nausea or vomiting Anesthetic complications: no   No complications documented.   Last Vitals:  Vitals:   05/29/20 0941 05/29/20 1001  BP: (!) 87/69 117/80  Pulse:    Resp:    Temp: (!) 36.3 C   SpO2:      Last Pain:  Vitals:   05/29/20 1011  TempSrc:   PainSc: 0-No pain                 Brett Canales Brycelyn Gambino

## 2020-05-29 NOTE — H&P (Signed)
Jonathon Bellows, MD [address] [place], [place], [place], [address] [address], [place], [place], [place], [address] Phone: [phone]  Fax: [phone]  Primary Care Physician:  Charlynne Cousins, MD   Pre-Procedure History & Physical: HPI:  Joshua [name] is a 70 y.o. male is here for an colonoscopy.   Past Medical History:  Diagnosis Date  . Allergy   . Amnesia 06/13/2014  . B12 deficiency 06/13/2014  . Benign essential HTN 10/30/2014  . BPH (benign prostatic hyperplasia)   . BPH with obstruction/lower urinary tract symptoms 05/15/2015  . History of kidney stones   . Hypogonadism in male   . Hypothyroidism 10/30/2014    Past Surgical History:  Procedure Laterality Date  . COLONOSCOPY  March 2016  . ESOPHAGOGASTRODUODENOSCOPY ENDOSCOPY  March 2016  . EYE SURGERY Bilateral    cataracts  . KNEE SURGERY Right   . SHOULDER SURGERY Left   . TONSILLECTOMY      Prior to Admission medications   Medication Sig Start Date End Date Taking? Authorizing Provider  fluticasone (FLONASE) 50 MCG/ACT nasal spray Place 2 sprays into both nostrils daily. 04/23/20  Yes Vigg, Avanti, MD  levothyroxine (SYNTHROID) 125 MCG tablet Take 1 tablet (125 mcg total) by mouth daily. 03/11/20  Yes Cannady, Jolene T, NP  losartan (COZAAR) 100 MG tablet Take 1 tablet (100 mg total) by mouth daily. 03/12/20  Yes Myles Gip, DO  mirabegron ER (MYRBETRIQ) 25 MG TB24 tablet Take 1 tablet (25 mg total) by mouth daily. 04/25/20  Yes McGowan, Larene Beach A, PA-C  Multiple Vitamins-Minerals (EYE VITAMINS PO) Take by mouth.   Yes [provider]  rosuvastatin (CRESTOR) 5 MG tablet Take 1 tablet (5 mg total) by mouth daily. Patient not taking: Reported on 05/21/2020 01/10/20   Myles Gip, DO    Allergies as of 05/15/2020 - Review Complete 05/15/2020  Allergen Reaction Noted  . Aspirin Hives 10/30/2014  . Celery oil Swelling 10/30/2014  . Flomax [tamsulosin hcl] Hives 05/26/2015  . Hydromorphone  Hives 10/30/2014  . Oysters [shellfish allergy]  10/25/2017  . Amlodipine besylate Swelling 08/24/2017  . Latex Rash 10/30/2014    Family History  Problem Relation Age of Onset  . Breast cancer Mother   . COPD Father   . Heart disease Father   . Prostate cancer Father     Social History   Socioeconomic History  . Marital status: Married    Spouse name: Not on file  . Number of children: Not on file  . Years of education: Not on file  . Highest education level: High school graduate  Occupational History  . Not on file  Tobacco Use  . Smoking status: Never Smoker  . Smokeless tobacco: Never Used  Vaping Use  . Vaping Use: Never used  Substance and Sexual Activity  . Alcohol use: Yes    Alcohol/week: 0.0 standard drinks    Comment: Rarely  . Drug use: No  . Sexual activity: Not on file  Other Topics Concern  . Not on file  Social History Narrative  . Not on file   Social Determinants of Health   Financial Resource Strain: Low Risk   . Difficulty of Paying Living Expenses: Not hard at all  Food Insecurity: No Food Insecurity  . Worried About Charity fundraiser in the Last Year: Never true  . Ran Out of Food in the Last Year: Never true  Transportation Needs: No Transportation Needs  . Lack of  Transportation (Medical): No  . Lack of Transportation (Non-Medical): No  Physical Activity: Inactive  . Days of Exercise per Week: 0 days  . Minutes of Exercise per Session: 0 min  Stress: No Stress Concern Present  . Feeling of Stress : Not at all  Social Connections: Socially Isolated  . Frequency of Communication with Friends and Family: Never  . Frequency of Social Gatherings with Friends and Family: Never  . Attends Religious Services: Never  . Active Member of Clubs or Organizations: No  . Attends Archivist Meetings: Never  . Marital Status: Married  Human resources officer Violence: Not on file    Review of Systems: See HPI, otherwise negative  ROS  Physical Exam: BP (!) 140/92   Pulse 69   Temp 97.6 F (36.4 C) (Temporal)   Resp 18   Ht 5\' 7"  (1.702 m)   Wt 95.7 kg   SpO2 99%   BMI 33.05 kg/m  General:   Alert,  pleasant and cooperative in NAD Head:  Normocephalic and atraumatic. Neck:  Supple; no masses or thyromegaly. Lungs:  Clear throughout to auscultation, normal respiratory effort.    Heart:  +S1, +S2, Regular rate and rhythm, No edema. Abdomen:  Soft, nontender and nondistended. Normal bowel sounds, without guarding, and without rebound.   Neurologic:  Alert and  oriented x4;  grossly normal neurologically.  Impression/Plan: Joshua [name] is here for an colonoscopy to be performed for surveillance due to prior history of colon polyps   Risks, benefits, limitations, and alternatives regarding  colonoscopy have been reviewed with the patient.  Questions have been answered.  All parties agreeable.   Jonathon Bellows, MD  05/29/2020, 8:45 AM

## 2020-05-29 NOTE — Op Note (Signed)
[facility] Patient Name: Joshua [name] Procedure Date: 05/29/2020 8:58 AM MRN: [id number] Account #: [id number] Date of Birth: Jun 29, 1950 Admit Type: Outpatient Age: 70 Room: [facility] ENDO ROOM 3 Gender: Male Note Status: Finalized Procedure:             Colonoscopy Indications:           High risk colon cancer surveillance: Personal history                         of colonic polyps, Surveillance: Personal history of                         colonic polyps (unknown histology) on last colonoscopy                         more than 5 years ago, Last colonoscopy: March 2016 Providers:             Jonathon Bellows MD, MD Referring MD:          Charlynne Cousins (Referring MD) Medicines:             Monitored Anesthesia Care Complications:         No immediate complications. Procedure:             Pre-Anesthesia Assessment:                        - Prior to the procedure, a History and Physical was                         performed, and patient medications, allergies and                         sensitivities were reviewed. The patient's tolerance                         of previous anesthesia was reviewed.                        - The risks and benefits of the procedure and the                         sedation options and risks were discussed with the                         patient. All questions were answered and informed                         consent was obtained.                        - ASA Grade Assessment: II - A patient with mild                         systemic disease.                        After obtaining informed consent, the colonoscope was                         passed under  direct vision. Throughout the procedure,                         the patient's blood pressure, pulse, and oxygen                         saturations were monitored continuously. The                         Colonoscope was introduced through the anus and                          advanced to the the cecum, identified by the                         appendiceal orifice. The colonoscopy was performed                         with ease. The patient tolerated the procedure well.                         The quality of the bowel preparation was good. Findings:      The perianal and digital rectal examinations were normal.      Two sessile polyps were found in the ascending colon. The polyps were 4       to 6 mm in size. These polyps were removed with a cold snare. Resection       and retrieval were complete.      A 20 mm polyp was found in the proximal descending colon. The polyp was       granular lateral spreading. Preparations were made for mucosal       resection. Saline was injected to raise the lesion. Piecemeal mucosal       resection using a snare was performed. Resection and retrieval were       complete. To prevent bleeding after mucosal resection, four hemostatic       clips were successfully placed. There was no bleeding at the end of the       procedure. MArgins of polyp demarcated with blue light or NBI      The exam was otherwise without abnormality.      Multiple small-mouthed diverticula were found in the entire colon. Impression:            - Two 4 to 6 mm polyps in the ascending colon, removed                         with a cold snare. Resected and retrieved.                        - One 20 mm polyp in the proximal descending colon,                         removed with mucosal resection. Resected and                         retrieved. Clips were placed.                        - The examination was otherwise normal.                        -  Mucosal resection was performed. Resection and                         retrieval were complete. Recommendation:        - Discharge patient to home (with escort).                        - Resume previous diet.                        - Continue present medications.                        - Await pathology results.                         - Repeat colonoscopy in 6 months for surveillance                         after piecemeal polypectomy. Procedure Code(s):     --- Professional ---                        [phone], Colonoscopy, flexible; with endoscopic mucosal                         resection                        [id number], 62, Colonoscopy, flexible; with removal of                         tumor(s), polyp(s), or other lesion(s) by snare                         technique Diagnosis Code(s):     --- Professional ---                        K63.5, Polyp of colon                        Z86.010, Personal history of colonic polyps CPT copyright 2019 [employer]. All rights reserved. The codes documented in this report are preliminary and upon coder review may  be revised to meet current compliance requirements. Jonathon Bellows, MD Jonathon Bellows MD, MD 05/29/2020 9:42:26 AM This report has been signed electronically. Number of Addenda: 0 Note Initiated On: 05/29/2020 8:58 AM Scope Withdrawal Time: 0 hours 30 minutes 43 seconds  Total Procedure Duration: 0 hours 32 minutes 35 seconds  Estimated Blood Loss:  Estimated blood loss: none. Estimated blood loss: none.      [facility]

## 2020-05-30 ENCOUNTER — Encounter: Payer: Self-pay | Admitting: Gastroenterology

## 2020-05-30 LAB — SURGICAL PATHOLOGY

## 2020-06-03 ENCOUNTER — Telehealth: Payer: Self-pay

## 2020-06-03 ENCOUNTER — Encounter: Payer: Self-pay | Admitting: Gastroenterology

## 2020-06-03 NOTE — Telephone Encounter (Signed)
Copied from [place] [id number]. Topic: General - Other >> Jun 03, 2020  8:34 AM Leward Quan A wrote: Reason for CRM: Patient request a call back with his lab results sayy he has been waiting but no news Please call Ph# [phone]

## 2020-06-03 NOTE — Telephone Encounter (Signed)
Patient informed of lab results and app made for Monday 5/9

## 2020-06-03 NOTE — Telephone Encounter (Signed)
Please advise 

## 2020-06-09 ENCOUNTER — Ambulatory Visit (INDEPENDENT_AMBULATORY_CARE_PROVIDER_SITE_OTHER): Payer: Medicare Other | Admitting: Internal Medicine

## 2020-06-09 ENCOUNTER — Encounter: Payer: Self-pay | Admitting: Internal Medicine

## 2020-06-09 ENCOUNTER — Other Ambulatory Visit: Payer: Self-pay

## 2020-06-09 VITALS — BP 118/70 | HR 67 | Temp 98.3°F | Ht 67.01 in | Wt 215.0 lb

## 2020-06-09 DIAGNOSIS — R7303 Prediabetes: Secondary | ICD-10-CM | POA: Diagnosis not present

## 2020-06-09 MED ORDER — LEVOTHYROXINE SODIUM 125 MCG PO TABS: 125.0000 ug | ORAL_TABLET | Freq: Every day | ORAL | 4 refills | Status: DC

## 2020-06-09 NOTE — Progress Notes (Signed)
Ht 5' 7.01" (1.702 m)   Wt 215 lb (97.5 kg)   BMI 33.67 kg/m    Subjective:    Patient ID: Joshua [name], male    DOB: 27-Jun-1950, 70 y.o.   MRN: [id number]  HPI: Joshua [name] is a 70 y.o. male  Hypertension This is a chronic problem. The problem is controlled. Pertinent negatives include no anxiety, blurred vision, chest pain, headaches, malaise/fatigue, neck pain, orthopnea, palpitations, peripheral edema, PND, shortness of breath or sweats. Identifiable causes of hypertension include a thyroid problem.  Benign Prostatic Hypertrophy This is a chronic problem.  Erectile Dysfunction He reports no anxiety.  Thyroid Problem Presents for follow-up visit. Patient reports no anxiety, cold intolerance, constipation, depressed mood, diaphoresis, diarrhea, hoarse voice, leg swelling, menstrual problem, nail problem or palpitations.    Chief Complaint  Patient presents with  . Hypertension  . Benign Prostatic Hypertrophy       . Erectile Dysfunction  . B-12 Defiency    Relevant past medical, surgical, family and social history reviewed and updated as indicated. Interim medical history since our last visit reviewed. Allergies and medications reviewed and updated.  Review of Systems  Constitutional: Negative for diaphoresis and malaise/fatigue.  HENT: Negative for hoarse voice.   Eyes: Negative for blurred vision.  Respiratory: Negative for shortness of breath.   Cardiovascular: Negative for chest pain, palpitations, orthopnea and PND.  Gastrointestinal: Negative for constipation and diarrhea.  Endocrine: Negative for cold intolerance.  Genitourinary: Negative for menstrual problem.  Musculoskeletal: Negative for neck pain.  Neurological: Negative for headaches.  Psychiatric/Behavioral: The patient is not nervous/anxious.     Per HPI unless specifically indicated above     Objective:    Ht 5' 7.01" (1.702 m)   Wt 215 lb (97.5 kg)   BMI 33.67 kg/m   Wt Readings  from Last 3 Encounters:  06/09/20 215 lb (97.5 kg)  05/29/20 211 lb (95.7 kg)  05/21/20 211 lb 6.4 oz (95.9 kg)    Physical Exam Vitals and nursing note reviewed.  Constitutional:      General: He is not in acute distress.    Appearance: Normal appearance. He is not ill-appearing or diaphoretic.  HENT:     Head: Normocephalic and atraumatic.     Right Ear: Tympanic membrane and external ear normal. There is no impacted cerumen.     Left Ear: External ear normal.     Nose: No congestion or rhinorrhea.     Mouth/Throat:     Pharynx: No oropharyngeal exudate or posterior oropharyngeal erythema.  Eyes:     Conjunctiva/sclera: Conjunctivae normal.     Pupils: Pupils are equal, round, and reactive to light.  Cardiovascular:     Rate and Rhythm: Normal rate and regular rhythm.     Heart sounds: No murmur heard. No friction rub. No gallop.   Pulmonary:     Effort: No respiratory distress.     Breath sounds: No stridor. No wheezing or rhonchi.  Chest:     Chest wall: No tenderness.  Abdominal:     General: Abdomen is flat. Bowel sounds are normal.     Palpations: Abdomen is soft. There is no mass.     Tenderness: There is no abdominal tenderness.  Musculoskeletal:     Cervical back: Normal range of motion and neck supple. No rigidity or tenderness.     Left lower leg: No edema.  Skin:    General: Skin is warm and dry.  Neurological:  Mental Status: He is alert.     Results for orders placed or performed during the hospital encounter of 05/29/20  Surgical pathology  Result Value Ref Range   SURGICAL PATHOLOGY      SURGICAL PATHOLOGY CASE: [id number] PATIENT: Joshua [name] Surgical Pathology Report     Specimen Submitted: A. Colon polyp x2, ascending; cold snare B. Colon polyp, descending; cold, hot snare  Clinical History: Screening colonoscopy.  Diverticulosis, colon polyps      DIAGNOSIS: A. COLON POLYP X2, ASCENDING; COLD SNARE: - TUBULAR ADENOMA  (2). - NEGATIVE FOR HIGH-GRADE DYSPLASIA AND MALIGNANCY.  B.  COLON POLYP, DESCENDING; COLD AND HOT SNARE: - TUBULAR ADENOMA. - NEGATIVE FOR HIGH-GRADE DYSPLASIA AND MALIGNANCY.  GROSS DESCRIPTION: A. Labeled: Cold snare ascending colon polyp x2 Received: In formalin Collection time: 9:09 AM on 05/29/2020 Placed into formalin time: 9:09 AM on 05/29/2020 Tissue fragment(s): 2 Size: Up to 0.8 and 0.3 cm Description: Received are 2 fragments of yellow-pink-tan soft tissue. The larger portion fragments easily when handled and may further fragment during processing. Entirely submitted in cassette 1.  B. Labeled: Co ld snare/hot snare descending colon polyp Received: In formalin Collection time: 9:15 AM on 05/29/2020 Placed into formalin time: 9:15 AM on 05/29/2020 Tissue fragment(s): Multiple Size: Aggregate, 1.7 x 1.5 x 1.2 cm Description: Received are multiple fragments of pink-tan soft tissue. Entirely submitted in cassette 1.  BS 05/29/20  Final Diagnosis performed by Joshua Burow, [name].   Electronically signed 05/30/2020 10:39:09AM The electronic signature indicates that the named Attending Pathologist has evaluated the specimen Technical component performed at [facility], [address] [place], [place] [address] Lab: [phone] Joshua [name]  Professional component performed at [facility], [facility], [place], [place], [place] [address] Lab: [phone] Joshua [name]         Current Outpatient Medications:  .  fluticasone (FLONASE) 50 MCG/ACT nasal spray, Place 2 sprays into both nostrils daily., Disp: 16 g, Rfl: 6 .  levothyroxine (SYNTHROID) 125 MCG tablet, Take 1 tablet (125 mcg total) by mouth daily., Disp: 90 tablet, Rfl: 4 .  losartan (COZAAR) 100 MG tablet, Take 1 tablet (100 mg total) by mouth daily., Disp: 90 tablet, Rfl: 0 .  mirabegron ER (MYRBETRIQ) 25 MG TB24 tablet, Take 1 tablet (25 mg total) by mouth daily.,  Disp: 28 tablet, Rfl: 0 .  Multiple Vitamins-Minerals (EYE VITAMINS PO), Take by mouth., Disp: , Rfl:  .  rosuvastatin (CRESTOR) 5 MG tablet, Take 1 tablet (5 mg total) by mouth daily. (Patient not taking: Reported on 05/21/2020), Disp: 90 tablet, Rfl: 3    Assessment & Plan:  1. Hypothyroidism will check TSH / FT4 Pt is on 125 mcg of synthroid for such  advcied to [place] [facility] FASTING.  NO MEDICATION/ FOOD FOR AN HOUR AFTER INGESTING THYROID PILLS.   2. HTN is on cozaar 100 mg  HTN :  Continue current meds.  Medication compliance emphasised. pt advised to keep Bp logs. Pt verbalised understanding of the same. Pt to have a low salt diet . Exercise to reach a goal of at least 150 mins a week.  lifestyle modifications explained and pt understands importance of the above.   3. Hyperlipidemia: is on rosuvastatin recheck FLP, check LFT's work on diet, SE of meds explained to pt. low fat and high fiber diet explained to pt.   Ref. Range 01/08/2020 09:46 05/21/2020 10:57  Total CHOL/HDL Ratio Latest Ref Range: 0.0 - 5.0 ratio 3.6 3.4  Cholesterol, Total Latest Ref Range: 100 - 199 mg/dL 169 164  HDL Cholesterol Latest Ref Range: >39 mg/dL 47 48  Triglycerides Latest Ref Range: 0 - 149 mg/dL 151 (H) 103  VLDL Cholesterol Cal Latest Ref Range: 5 - 40 mg/dL 26 19  LDL Chol Calc (NIH) Latest Ref Range: 0 - 99 mg/dL 96 97   4. BPH was on finesteride for such will refer to urology   5. Prediabetes a1c 6.0  Lifestyle modifications advised to pt. A1c at   Portion control and avoiding high carb low fat diet advised.  Diet plan given to pt   exercise plan given and encouraged.  To increase exercise to 150 mins a week ie 21/2 hours a week. Pt verbalises understanding of the above.   6. Polyps : abnl Cscope -  To have a Recheck in 6 months.  Problem List Items Addressed This Visit   None      Follow up plan: No follow-ups on  file.

## 2020-06-11 ENCOUNTER — Other Ambulatory Visit: Payer: Self-pay | Admitting: Family Medicine

## 2020-06-11 DIAGNOSIS — I10 Essential (primary) hypertension: Secondary | ICD-10-CM

## 2020-06-16 ENCOUNTER — Ambulatory Visit: Payer: Medicare Other | Admitting: Internal Medicine

## 2020-06-16 NOTE — Progress Notes (Signed)
Pt canceled his appointment. Office staff will reach out to reschedule.  

## 2020-06-17 ENCOUNTER — Encounter: Payer: Self-pay | Admitting: Internal Medicine

## 2020-06-24 DIAGNOSIS — H35432 Paving stone degeneration of retina, left eye: Secondary | ICD-10-CM | POA: Diagnosis not present

## 2020-06-24 DIAGNOSIS — H353221 Exudative age-related macular degeneration, left eye, with active choroidal neovascularization: Secondary | ICD-10-CM | POA: Diagnosis not present

## 2020-07-18 ENCOUNTER — Ambulatory Visit: Payer: Medicare Other

## 2020-07-18 DIAGNOSIS — Z91199 Patient's noncompliance with other medical treatment and regimen due to unspecified reason: Secondary | ICD-10-CM

## 2020-07-18 NOTE — Progress Notes (Unsigned)
Pt was unable to get connected to his visit. Office staff will assist pt in rescheduling.

## 2020-07-21 DIAGNOSIS — M9905 Segmental and somatic dysfunction of pelvic region: Secondary | ICD-10-CM | POA: Diagnosis not present

## 2020-07-21 DIAGNOSIS — M5136 Other intervertebral disc degeneration, lumbar region: Secondary | ICD-10-CM | POA: Diagnosis not present

## 2020-07-21 DIAGNOSIS — M9903 Segmental and somatic dysfunction of lumbar region: Secondary | ICD-10-CM | POA: Diagnosis not present

## 2020-07-21 DIAGNOSIS — M5432 Sciatica, left side: Secondary | ICD-10-CM | POA: Diagnosis not present

## 2020-07-23 DIAGNOSIS — M5432 Sciatica, left side: Secondary | ICD-10-CM | POA: Diagnosis not present

## 2020-07-23 DIAGNOSIS — M9905 Segmental and somatic dysfunction of pelvic region: Secondary | ICD-10-CM | POA: Diagnosis not present

## 2020-07-23 DIAGNOSIS — M5136 Other intervertebral disc degeneration, lumbar region: Secondary | ICD-10-CM | POA: Diagnosis not present

## 2020-07-23 DIAGNOSIS — M9903 Segmental and somatic dysfunction of lumbar region: Secondary | ICD-10-CM | POA: Diagnosis not present

## 2020-07-25 DIAGNOSIS — M9905 Segmental and somatic dysfunction of pelvic region: Secondary | ICD-10-CM | POA: Diagnosis not present

## 2020-07-25 DIAGNOSIS — M5136 Other intervertebral disc degeneration, lumbar region: Secondary | ICD-10-CM | POA: Diagnosis not present

## 2020-07-25 DIAGNOSIS — M9903 Segmental and somatic dysfunction of lumbar region: Secondary | ICD-10-CM | POA: Diagnosis not present

## 2020-07-25 DIAGNOSIS — M5432 Sciatica, left side: Secondary | ICD-10-CM | POA: Diagnosis not present

## 2020-07-28 DIAGNOSIS — M9905 Segmental and somatic dysfunction of pelvic region: Secondary | ICD-10-CM | POA: Diagnosis not present

## 2020-07-28 DIAGNOSIS — M9903 Segmental and somatic dysfunction of lumbar region: Secondary | ICD-10-CM | POA: Diagnosis not present

## 2020-07-28 DIAGNOSIS — M5432 Sciatica, left side: Secondary | ICD-10-CM | POA: Diagnosis not present

## 2020-07-28 DIAGNOSIS — M5136 Other intervertebral disc degeneration, lumbar region: Secondary | ICD-10-CM | POA: Diagnosis not present

## 2020-07-29 DIAGNOSIS — H353221 Exudative age-related macular degeneration, left eye, with active choroidal neovascularization: Secondary | ICD-10-CM | POA: Diagnosis not present

## 2020-07-30 DIAGNOSIS — M5136 Other intervertebral disc degeneration, lumbar region: Secondary | ICD-10-CM | POA: Diagnosis not present

## 2020-07-30 DIAGNOSIS — M9905 Segmental and somatic dysfunction of pelvic region: Secondary | ICD-10-CM | POA: Diagnosis not present

## 2020-07-30 DIAGNOSIS — M9903 Segmental and somatic dysfunction of lumbar region: Secondary | ICD-10-CM | POA: Diagnosis not present

## 2020-07-30 DIAGNOSIS — M5432 Sciatica, left side: Secondary | ICD-10-CM | POA: Diagnosis not present

## 2020-08-05 DIAGNOSIS — M5136 Other intervertebral disc degeneration, lumbar region: Secondary | ICD-10-CM | POA: Diagnosis not present

## 2020-08-05 DIAGNOSIS — M9903 Segmental and somatic dysfunction of lumbar region: Secondary | ICD-10-CM | POA: Diagnosis not present

## 2020-08-05 DIAGNOSIS — M9905 Segmental and somatic dysfunction of pelvic region: Secondary | ICD-10-CM | POA: Diagnosis not present

## 2020-08-05 DIAGNOSIS — M5432 Sciatica, left side: Secondary | ICD-10-CM | POA: Diagnosis not present

## 2020-08-08 DIAGNOSIS — L218 Other seborrheic dermatitis: Secondary | ICD-10-CM | POA: Diagnosis not present

## 2020-08-08 DIAGNOSIS — D2261 Melanocytic nevi of right upper limb, including shoulder: Secondary | ICD-10-CM | POA: Diagnosis not present

## 2020-08-08 DIAGNOSIS — D225 Melanocytic nevi of trunk: Secondary | ICD-10-CM | POA: Diagnosis not present

## 2020-08-08 DIAGNOSIS — M5136 Other intervertebral disc degeneration, lumbar region: Secondary | ICD-10-CM | POA: Diagnosis not present

## 2020-08-08 DIAGNOSIS — D2272 Melanocytic nevi of left lower limb, including hip: Secondary | ICD-10-CM | POA: Diagnosis not present

## 2020-08-08 DIAGNOSIS — M9903 Segmental and somatic dysfunction of lumbar region: Secondary | ICD-10-CM | POA: Diagnosis not present

## 2020-08-08 DIAGNOSIS — D2262 Melanocytic nevi of left upper limb, including shoulder: Secondary | ICD-10-CM | POA: Diagnosis not present

## 2020-08-08 DIAGNOSIS — D2271 Melanocytic nevi of right lower limb, including hip: Secondary | ICD-10-CM | POA: Diagnosis not present

## 2020-08-08 DIAGNOSIS — M9905 Segmental and somatic dysfunction of pelvic region: Secondary | ICD-10-CM | POA: Diagnosis not present

## 2020-08-08 DIAGNOSIS — L821 Other seborrheic keratosis: Secondary | ICD-10-CM | POA: Diagnosis not present

## 2020-08-08 DIAGNOSIS — D485 Neoplasm of uncertain behavior of skin: Secondary | ICD-10-CM | POA: Diagnosis not present

## 2020-08-08 DIAGNOSIS — M5432 Sciatica, left side: Secondary | ICD-10-CM | POA: Diagnosis not present

## 2020-08-13 DIAGNOSIS — M9903 Segmental and somatic dysfunction of lumbar region: Secondary | ICD-10-CM | POA: Diagnosis not present

## 2020-08-13 DIAGNOSIS — M5136 Other intervertebral disc degeneration, lumbar region: Secondary | ICD-10-CM | POA: Diagnosis not present

## 2020-08-13 DIAGNOSIS — M9905 Segmental and somatic dysfunction of pelvic region: Secondary | ICD-10-CM | POA: Diagnosis not present

## 2020-08-13 DIAGNOSIS — M5432 Sciatica, left side: Secondary | ICD-10-CM | POA: Diagnosis not present

## 2020-08-18 ENCOUNTER — Ambulatory Visit (INDEPENDENT_AMBULATORY_CARE_PROVIDER_SITE_OTHER): Payer: Medicare Other | Admitting: Internal Medicine

## 2020-08-18 VITALS — BP 121/83 | Resp 18 | Ht 66.0 in | Wt 216.9 lb

## 2020-08-18 DIAGNOSIS — G4733 Obstructive sleep apnea (adult) (pediatric): Secondary | ICD-10-CM

## 2020-08-18 DIAGNOSIS — E669 Obesity, unspecified: Secondary | ICD-10-CM

## 2020-08-18 DIAGNOSIS — I10 Essential (primary) hypertension: Secondary | ICD-10-CM | POA: Diagnosis not present

## 2020-08-18 DIAGNOSIS — Z7189 Other specified counseling: Secondary | ICD-10-CM | POA: Insufficient documentation

## 2020-08-18 DIAGNOSIS — Z9989 Dependence on other enabling machines and devices: Secondary | ICD-10-CM

## 2020-08-18 NOTE — Patient Instructions (Signed)

## 2020-08-18 NOTE — Progress Notes (Signed)
[facility] [place], [place] [address]  Pulmonary Sleep Medicine   Office Visit Note  Patient Name: Joshua [name] DOB: 30-Aug-1950 MRN [id number]    Chief Complaint: Obstructive Sleep Apnea visit  Brief History:  Joshua [name] is seen today for initial consult to establish care. Currently using APAP @ 9 - 14 cmH2O.  The patient has a 7 year history of sleep apnea. Patient is using PAP nightly.  The patient feels better after sleeping with PAP.  The patient reports dong better from PAP use. Epworth Sleepiness Score is 7 out of 24. The patient 1-2 naps  upto 2 hours if he doesn't  sleep well the night before; he takes care of his wife causing ongoing pain from lifting. Her.. The patient complains of the following: tank still runs out of water  The compliance download shows  compliance with an average use time of 7:04 hours @ 92%. The AHI is 4.7  The patient does not complain of limb movements disrupting sleep.  ROS  General: (-) fever, (-) chills, (-) night sweat Nose and Sinuses: (-) nasal stuffiness or itchiness, (-) postnasal drip, (-) nosebleeds, (-) sinus trouble. Mouth and Throat: (-) sore throat, (-) hoarseness. Neck: (-) swollen glands, (-) enlarged thyroid, (-) neck pain. Respiratory: - cough, - shortness of breath, - wheezing. Neurologic: - numbness, - tingling. Psychiatric: - anxiety, - depression   Current Medication: Outpatient Encounter Medications as of 08/18/2020  Medication Sig Note   fluticasone (FLONASE) 50 MCG/ACT nasal spray Place 2 sprays into both nostrils daily. 05/15/2020: [facility]   levothyroxine (SYNTHROID) 125 MCG tablet Take 1 tablet (125 mcg total) by mouth daily.    losartan (COZAAR) 100 MG tablet TAKE 1 TABLET EVERY DAY    mirabegron ER (MYRBETRIQ) 25 MG TB24 tablet Take 1 tablet (25 mg total) by mouth daily.    Multiple Vitamins-Minerals (EYE VITAMINS PO) Take by mouth.    No facility-administered encounter medications on file  as of 08/18/2020.    Surgical History: Past Surgical History:  Procedure Laterality Date   COLONOSCOPY  March 2016   COLONOSCOPY WITH PROPOFOL N/A 05/29/2020   Procedure: COLONOSCOPY WITH PROPOFOL;  Surgeon: Jonathon Bellows, MD;  Location: [facility] ENDOSCOPY;  Service: Gastroenterology;  Laterality: N/A;   ESOPHAGOGASTRODUODENOSCOPY ENDOSCOPY  March 2016   EYE SURGERY Bilateral    cataracts   KNEE SURGERY Right    SHOULDER SURGERY Left    TONSILLECTOMY      Medical History: Past Medical History:  Diagnosis Date   Allergy    Amnesia 06/13/2014   B12 deficiency 06/13/2014   Benign essential HTN 10/30/2014   BPH (benign prostatic hyperplasia)    BPH with obstruction/lower urinary tract symptoms 05/15/2015   History of kidney stones    Hypogonadism in male    Hypothyroidism 10/30/2014    Family History: Non contributory to the present illness  Social History: Social History   Socioeconomic History   Marital status: Married    Spouse name: Not on file   Number of children: Not on file   Years of education: Not on file   Highest education level: High school graduate  Occupational History   Not on file  Tobacco Use   Smoking status: Never   Smokeless tobacco: Never  Vaping Use   Vaping Use: Never used  Substance and Sexual Activity   Alcohol use: Yes    Alcohol/week: 0.0 standard drinks    Comment: Rarely   Drug use: No  Sexual activity: Not on file  Other Topics Concern   Not on file  Social History Narrative   Not on file   Social Determinants of Health   Financial Resource Strain: Not on file  Food Insecurity: Not on file  Transportation Needs: Not on file  Physical Activity: Not on file  Stress: Not on file  Social Connections: Not on file  Intimate Partner Violence: Not on file    Vital Signs: Blood pressure 121/83, resp. rate 18, height '5\' 6"'  (1.676 m), weight 216 lb 14.4 oz (98.4 kg), SpO2 97 %.  Examination: General Appearance: The patient is  well-developed, well-nourished, and in no distress. Neck Circumference: 37 Skin: Gross inspection of skin unremarkable. Head: normocephalic, no gross deformities. Eyes: no gross deformities noted. ENT: ears appear grossly normal Neurologic: Alert and oriented. No involuntary movements.    EPWORTH SLEEPINESS SCALE:  Scale:  (0)= no chance of dozing; (1)= slight chance of dozing; (2)= moderate chance of dozing; (3)= high chance of dozing  Chance  Situtation    Sitting and reading: 1    Watching TV: 2    Sitting Inactive in public: 0    As a passenger in car: 0      Lying down to rest: 3    Sitting and talking: 0    Sitting quielty after lunch: 1    In a car, stopped in traffic: 0   TOTAL SCORE:   7 out of 24    SLEEP STUDIES:  PSG (06/14/13)  - AHI of 22; (Supine AHI 59, REM Lateral 49); Min SpO2 88%   CPAP COMPLIANCE DATA:  Date Range: 08/17/19-08/15/20  Average Daily Use: 7:04 hours  Median Use: 7:25 hours  Compliance for > 4 Hours: 92% days  AHI: 4.3 respiratory events per hour  Days Used: 359/365 days  Mask Leak: 31.3lpm  95th Percentile Pressure: 13.9 cmH2O         LABS: Recent Results (from the past 2160 hour(s))  Bayer DCA Hb A1c Waived     Status: None   Collection Time: 05/21/20 10:54 AM  Result Value Ref Range   HB A1C (BAYER DCA - WAIVED) 6.0 <7.0 %    Comment:                                       Diabetic Adult            <7.0                                       Healthy Adult        4.3 - 5.7                                                           (DCCT/NGSP) American Diabetes Association's Summary of Glycemic Recommendations for Adults with Diabetes: Hemoglobin A1c <7.0%. More stringent glycemic goals (A1c <6.0%) may further reduce complications at the cost of increased risk of hypoglycemia.   T4, free     Status: None   Collection Time: 05/21/20 10:57 AM  Result Value Ref Range   Free T4 1.55 0.82 -  1.77 ng/dL  Lipid  panel     Status: None   Collection Time: 05/21/20 10:57 AM  Result Value Ref Range   Cholesterol, Total 164 100 - 199 mg/dL   Triglycerides 103 0 - 149 mg/dL   HDL 48 >39 mg/dL   VLDL Cholesterol Cal 19 5 - 40 mg/dL   LDL Chol Calc (NIH) 97 0 - 99 mg/dL   Chol/HDL Ratio 3.4 0.0 - 5.0 ratio    Comment:                                   T. Chol/HDL Ratio                                             Men  Women                               1/2 Avg.Risk  3.4    3.3                                   Avg.Risk  5.0    4.4                                2X Avg.Risk  9.6    7.1                                3X Avg.Risk 23.4   11.0   Thyroid Panel With TSH     Status: None   Collection Time: 05/21/20 10:57 AM  Result Value Ref Range   TSH 0.803 0.450 - 4.500 uIU/mL   T4, Total 7.9 4.5 - 12.0 ug/dL   T3 Uptake Ratio 29 24 - 39 %   Free Thyroxine Index 2.3 1.2 - 4.9  CBC with Differential/Platelet     Status: None   Collection Time: 05/21/20 10:57 AM  Result Value Ref Range   WBC 6.3 3.4 - 10.8 x10E3/uL   RBC 4.51 4.14 - 5.80 x10E6/uL   Hemoglobin 14.8 13.0 - 17.7 g/dL   Hematocrit 42.6 37.5 - 51.0 %   MCV 95 79 - 97 fL   MCH 32.8 26.6 - 33.0 pg   MCHC 34.7 31.5 - 35.7 g/dL   RDW 12.4 11.6 - 15.4 %   Platelets 156 150 - 450 x10E3/uL   Neutrophils 60 Not Estab. %   Lymphs 26 Not Estab. %   Monocytes 9 Not Estab. %   Eos 4 Not Estab. %   Basos 1 Not Estab. %   Neutrophils Absolute 3.8 1.4 - 7.0 x10E3/uL   Lymphocytes Absolute 1.6 0.7 - 3.1 x10E3/uL   Monocytes Absolute 0.6 0.1 - 0.9 x10E3/uL   EOS (ABSOLUTE) 0.2 0.0 - 0.4 x10E3/uL   Basophils Absolute 0.0 0.0 - 0.2 x10E3/uL   Immature Granulocytes 0 Not Estab. %   Immature Grans (Abs) 0.0 0.0 - 0.1 x10E3/uL  Comprehensive metabolic panel     Status: None   Collection Time: 05/21/20 10:57 AM  Result Value Ref Range   Glucose 92 65 -  99 mg/dL   BUN 15 8 - 27 mg/dL   Creatinine, Ser 0.97 0.76 - 1.27 mg/dL   eGFR 83 >59  mL/min/1.73   BUN/Creatinine Ratio 15 10 - 24   Sodium 141 134 - 144 mmol/L   Potassium 4.3 3.5 - 5.2 mmol/L   Chloride 101 96 - 106 mmol/L   CO2 25 20 - 29 mmol/L   Calcium 9.4 8.6 - 10.2 mg/dL   Total Protein 6.9 6.0 - 8.5 g/dL   Albumin 3.9 3.7 - 4.7 g/dL   Globulin, Total 3.0 1.5 - 4.5 g/dL   Albumin/Globulin Ratio 1.3 1.2 - 2.2   Bilirubin Total 0.8 0.0 - 1.2 mg/dL   Alkaline Phosphatase 84 44 - 121 IU/L   AST 20 0 - 40 IU/L   ALT 18 0 - 44 IU/L  PSA Total+%Free (Serial)     Status: None   Collection Time: 05/21/20 10:57 AM  Result Value Ref Range   Prostate Specific Ag, Serum 2.6 0.0 - 4.0 ng/mL    Comment: Roche ECLIA methodology. According to the American Urological Association, Serum PSA should decrease and remain at undetectable levels after radical prostatectomy. The AUA defines biochemical recurrence as an initial PSA value 0.2 ng/mL or greater followed by a subsequent confirmatory PSA value 0.2 ng/mL or greater. Values obtained with different assay methods or kits cannot be used interchangeably. Results cannot be interpreted as absolute evidence of the presence or absence of malignant disease.    PSA, Free 0.50 N/A ng/mL    Comment: Roche ECLIA methodology.   PSA, Free Pct 19.2 %    Comment: The table below lists the probability of prostate cancer for men with non-suspicious DRE results and total PSA between 4 and 10 ng/mL, by patient age Ricci Barker, [place], [id number]).                   % Free PSA       50-64 yr        65-75 yr                   0.00-10.00%        56%             55%                  10.01-15.00%        24%             35%                  15.01-20.00%        17%             23%                  20.01-25.00%        10%             20%                       >25.00%         5%              9% Please note:  Catalona et al did not make specific               recommendations regarding the use of               percent free PSA for any other  population               of men.   Surgical pathology     Status: None   Collection Time: 05/29/20  9:09 AM  Result Value Ref Range   SURGICAL PATHOLOGY      SURGICAL PATHOLOGY CASE: [id number] PATIENT: [name] Surgical Pathology Report     Specimen Submitted: A. Colon polyp x2, ascending; cold snare B. Colon polyp, descending; cold, hot snare  Clinical History: Screening colonoscopy.  Diverticulosis, colon polyps      DIAGNOSIS: A. COLON POLYP X2, ASCENDING; COLD SNARE: - TUBULAR ADENOMA (2). - NEGATIVE FOR HIGH-GRADE DYSPLASIA AND MALIGNANCY.  B.  COLON POLYP, DESCENDING; COLD AND HOT SNARE: - TUBULAR ADENOMA. - NEGATIVE FOR HIGH-GRADE DYSPLASIA AND MALIGNANCY.  GROSS DESCRIPTION: A. Labeled: Cold snare ascending colon polyp x2 Received: In formalin Collection time: 9:09 AM on 05/29/2020 Placed into formalin time: 9:09 AM on 05/29/2020 Tissue fragment(s): 2 Size: Up to 0.8 and 0.3 cm Description: Received are 2 fragments of yellow-pink-tan soft tissue. The larger portion fragments easily when handled and may further fragment during processing. Entirely submitted in cassette 1.  B. Labeled: Co ld snare/hot snare descending colon polyp Received: In formalin Collection time: 9:15 AM on 05/29/2020 Placed into formalin time: 9:15 AM on 05/29/2020 Tissue fragment(s): Multiple Size: Aggregate, 1.7 x 1.5 x 1.2 cm Description: Received are multiple fragments of pink-tan soft tissue. Entirely submitted in cassette 1.  BS 05/29/20  Final Diagnosis performed by Quay Burow, MD.   Electronically signed 05/30/2020 10:39:09AM The electronic signature indicates that the named Attending Pathologist has evaluated the specimen Technical component performed at [facility], [address], [place], [place] [address] Lab: [phone] Dir: Rush Farmer, MD, MMM  Professional component performed at [facility], [facility], [place], [place],  [place] [address] Lab: [phone] Dir: Dellia Nims. Reuel Derby, MD     Radiology: No results found.  No results found.  No results found.    Assessment and Plan: Patient Active Problem List   Diagnosis Date Noted   OSA on CPAP 08/18/2020   CPAP use counseling 08/18/2020   Hypertension 08/18/2020   Ear pain 01/10/2020   Obesity (BMI 30-39.9) 01/08/2020   Headache 01/08/2020   Prediabetes 10/12/2019   Cyst of soft tissue 10/11/2019   Urinary frequency 10/11/2019   Small vessel disease ([place]) 10/25/2017   Chest pain 08/24/2017   Pernicious anemia 06/10/2016   Advanced care planning/counseling discussion 06/10/2016   Screening for prostate cancer 06/10/2015   Nephrolithiasis 06/10/2015   Erectile dysfunction 06/10/2015   BPH with obstruction/lower urinary tract symptoms 05/15/2015   Benign essential HTN 10/30/2014   Hypothyroidism 10/30/2014   B12 deficiency 06/13/2014   Memory loss 06/13/2014   1. OSA on CPAP The patient does tolerate PAP and reports definite benefit from PAP use. The patient was reminded how to clean equipment and advised to replace supplies routinely. We will adjust his range slightly as he is using the upper end of the range and do a 2 week download. The patient was also counselled on weight loss. The compliance is excellent . The AHI is 4.3.   OSA- continue excellent compliance. F/u one year   2. CPAP use counseling CPAP Counseling: had a lengthy discussion with the patient regarding the importance of PAP therapy in management of the sleep apnea. Patient appears to understand the risk factor reduction and also understands the risks associated with untreated sleep apnea. Patient will try to make a  good faith effort to remain compliant with therapy. Also instructed the patient on proper cleaning of the device including the water must be changed daily if possible and use of distilled water is preferred. Patient understands that the machine should be regularly cleaned  with appropriate recommended cleaning solutions that do not damage the PAP machine for example given white vinegar and water rinses. Other methods such as ozone treatment may not be as good as these simple methods to achieve cleaning.   3. Obesity (BMI 30-39.9) Obesity Counseling: Had a lengthy discussion regarding patients BMI and weight issues. Patient was instructed on portion control as well as increased activity. Also discussed caloric restrictions with trying to maintain intake less than 2000 Kcal. Discussions were made in accordance with the 5As of weight management. Simple actions such as not eating late and if able to, taking a walk is suggested.   4. Hypertension, unspecified type Hypertension Counseling:   The following hypertensive lifestyle modification were recommended and discussed:  1. Limiting alcohol intake to less than 1 oz/day of ethanol:(24 oz of beer or 8 oz of wine or 2 oz of 100-proof whiskey). 2. Take baby ASA 81 mg daily. 3. Importance of regular aerobic exercise and losing weight. 4. Reduce dietary saturated fat and cholesterol intake for overall cardiovascular health. 5. Maintaining adequate dietary potassium, calcium, and magnesium intake. 6. Regular monitoring of the blood pressure. 7. Reduce sodium intake to less than 100 mmol/day (less than 2.3 gm of sodium or less than 6 gm of sodium choride)      General Counseling: I have discussed the findings of the evaluation and examination with Jeneen Rinks.  I have also discussed any further diagnostic evaluation thatmay be needed or ordered today. Atanacio verbalizes understanding of the findings of todays visit. We also reviewed his medications today and discussed drug interactions and side effects including but not limited excessive drowsiness and altered mental states. We also discussed that there is always a risk not just to him but also people around him. he has been encouraged to call the office with any questions or concerns  that should arise related to todays visit.  No orders of the defined types were placed in this encounter.       I have personally obtained a history, examined the patient, evaluated laboratory and imaging results, formulated the assessment and plan and placed orders.   This patient was seen today by Tressie Ellis, PA-C in collaboration with Dr. Devona Konig.    Allyne Gee, MD [facility] Diplomate ABMS Pulmonary and Critical Care Medicine Sleep medicine

## 2020-08-20 ENCOUNTER — Ambulatory Visit (INDEPENDENT_AMBULATORY_CARE_PROVIDER_SITE_OTHER): Payer: Medicare Other | Admitting: Internal Medicine

## 2020-08-20 ENCOUNTER — Encounter: Payer: Self-pay | Admitting: Internal Medicine

## 2020-08-20 ENCOUNTER — Other Ambulatory Visit: Payer: Self-pay

## 2020-08-20 VITALS — BP 111/75 | HR 82 | Temp 98.5°F | Ht 65.98 in | Wt 215.0 lb

## 2020-08-20 DIAGNOSIS — Z136 Encounter for screening for cardiovascular disorders: Secondary | ICD-10-CM | POA: Diagnosis not present

## 2020-08-20 DIAGNOSIS — M9905 Segmental and somatic dysfunction of pelvic region: Secondary | ICD-10-CM | POA: Diagnosis not present

## 2020-08-20 DIAGNOSIS — N401 Enlarged prostate with lower urinary tract symptoms: Secondary | ICD-10-CM | POA: Diagnosis not present

## 2020-08-20 DIAGNOSIS — R011 Cardiac murmur, unspecified: Secondary | ICD-10-CM | POA: Diagnosis not present

## 2020-08-20 DIAGNOSIS — E039 Hypothyroidism, unspecified: Secondary | ICD-10-CM | POA: Diagnosis not present

## 2020-08-20 DIAGNOSIS — I10 Essential (primary) hypertension: Secondary | ICD-10-CM

## 2020-08-20 DIAGNOSIS — M5136 Other intervertebral disc degeneration, lumbar region: Secondary | ICD-10-CM | POA: Diagnosis not present

## 2020-08-20 DIAGNOSIS — N3943 Post-void dribbling: Secondary | ICD-10-CM | POA: Diagnosis not present

## 2020-08-20 DIAGNOSIS — M5432 Sciatica, left side: Secondary | ICD-10-CM | POA: Diagnosis not present

## 2020-08-20 DIAGNOSIS — M9903 Segmental and somatic dysfunction of lumbar region: Secondary | ICD-10-CM | POA: Diagnosis not present

## 2020-08-20 NOTE — Progress Notes (Signed)
BP 111/75   Pulse 82   Temp 98.5 F (36.9 C) (Oral)   Ht 5' 5.98" (1.676 m)   Wt 215 lb (97.5 kg)   SpO2 96%   BMI 34.72 kg/m    Subjective:    Patient ID: Joshua [name], male    DOB: [birth year], 70 y.o.   MRN: [id number]  Chief Complaint  Patient presents with   Hypertension   Diabetes   Hyperlipidemia   Hypothyroidism   Benign Prostatic Hypertrophy   Erectile Dysfunction    HPI: Joshua [name] is a 70 y.o. male  Hypertension This is a chronic problem. The current episode started more than 1 month ago. Pertinent negatives include no palpitations. Identifiable causes of hypertension include a thyroid problem.  Hyperlipidemia This is a chronic problem. The problem is controlled.  Thyroid Problem Presents for follow-up visit. Patient reports no anxiety, cold intolerance, constipation, depressed mood, diaphoresis, diarrhea, dry skin, fatigue, hair loss, heat intolerance, hoarse voice, leg swelling, menstrual problem, nail problem, palpitations, tremors, visual change, weight gain or weight loss. His past medical history is significant for hyperlipidemia.   Chief Complaint  Patient presents with   Hypertension   Diabetes   Hyperlipidemia   Hypothyroidism   Benign Prostatic Hypertrophy   Erectile Dysfunction    Relevant past medical, surgical, family and social history reviewed and updated as indicated. Interim medical history since our last visit reviewed. Allergies and medications reviewed and updated.  Review of Systems  Constitutional:  Negative for diaphoresis, fatigue, weight gain and weight loss.  HENT:  Negative for hoarse voice.   Cardiovascular:  Negative for palpitations.  Gastrointestinal:  Negative for constipation and diarrhea.  Endocrine: Negative for cold intolerance and heat intolerance.  Genitourinary:  Negative for menstrual problem.  Neurological:  Negative for tremors.  Psychiatric/Behavioral:  The patient is not nervous/anxious.    Per  HPI unless specifically indicated above     Objective:    BP 111/75   Pulse 82   Temp 98.5 F (36.9 C) (Oral)   Ht 5' 5.98" (1.676 m)   Wt 215 lb (97.5 kg)   SpO2 96%   BMI 34.72 kg/m   Wt Readings from Last 3 Encounters:  08/20/20 215 lb (97.5 kg)  08/18/20 216 lb 14.4 oz (98.4 kg)  06/09/20 215 lb (97.5 kg)    Physical Exam Vitals and nursing note reviewed.  Constitutional:      General: He is not in acute distress.    Appearance: Normal appearance. He is not ill-appearing or diaphoretic.  HENT:     Head: Normocephalic and atraumatic.     Right Ear: Tympanic membrane and external ear normal. There is no impacted cerumen.     Left Ear: External ear normal.     Nose: No congestion or rhinorrhea.     Mouth/Throat:     Pharynx: No oropharyngeal exudate or posterior oropharyngeal erythema.  Eyes:     Conjunctiva/sclera: Conjunctivae normal.     Pupils: Pupils are equal, round, and reactive to light.  Cardiovascular:     Rate and Rhythm: Normal rate and regular rhythm.     Heart sounds: No murmur heard.   No friction rub. No gallop.  Pulmonary:     Effort: No respiratory distress.     Breath sounds: No stridor. No wheezing or rhonchi.  Chest:     Chest wall: No tenderness.  Abdominal:     General: Abdomen is flat. Bowel sounds are normal.  Palpations: Abdomen is soft. There is no mass.     Tenderness: There is no abdominal tenderness.  Musculoskeletal:     Cervical back: Normal range of motion and neck supple. No rigidity or tenderness.     Left lower leg: No edema.  Skin:    General: Skin is warm and dry.  Neurological:     Mental Status: He is alert.    Results for orders placed or performed during the hospital encounter of 05/29/20  Surgical pathology  Result Value Ref Range   SURGICAL PATHOLOGY      SURGICAL PATHOLOGY CASE: [id number] PATIENT: [name] Surgical Pathology Report     Specimen Submitted: A. Colon polyp x2, ascending; cold  snare B. Colon polyp, descending; cold, hot snare  Clinical History: Screening colonoscopy.  Diverticulosis, colon polyps      DIAGNOSIS: A. COLON POLYP X2, ASCENDING; COLD SNARE: - TUBULAR ADENOMA (2). - NEGATIVE FOR HIGH-GRADE DYSPLASIA AND MALIGNANCY.  B.  COLON POLYP, DESCENDING; COLD AND HOT SNARE: - TUBULAR ADENOMA. - NEGATIVE FOR HIGH-GRADE DYSPLASIA AND MALIGNANCY.  GROSS DESCRIPTION: A. Labeled: Cold snare ascending colon polyp x2 Received: In formalin Collection time: 9:09 AM on 05/29/2020 Placed into formalin time: 9:09 AM on 05/29/2020 Tissue fragment(s): 2 Size: Up to 0.8 and 0.3 cm Description: Received are 2 fragments of yellow-pink-tan soft tissue. The larger portion fragments easily when handled and may further fragment during processing. Entirely submitted in cassette 1.  B. Labeled: Co ld snare/hot snare descending colon polyp Received: In formalin Collection time: 9:15 AM on 05/29/2020 Placed into formalin time: 9:15 AM on 05/29/2020 Tissue fragment(s): Multiple Size: Aggregate, 1.7 x 1.5 x 1.2 cm Description: Received are multiple fragments of pink-tan soft tissue. Entirely submitted in cassette 1.  BS 05/29/20  Final Diagnosis performed by Quay Burow, MD.   Electronically signed 05/30/2020 10:39:09AM The electronic signature indicates that the named Attending Pathologist has evaluated the specimen Technical component performed at [facility], [address] [place], [place] [address] Lab: [phone] Dir: Rush Farmer, MD, MMM  Professional component performed at [facility], [facility], [place], [place], [place] [address] Lab: [phone] Dir: Dellia Nims. Rubinas, MD         Current Outpatient Medications:    fluticasone (FLONASE) 50 MCG/ACT nasal spray, Place 2 sprays into both nostrils daily., Disp: 16 g, Rfl: 6   levothyroxine (SYNTHROID) 125 MCG tablet, Take 1 tablet (125 mcg total) by mouth daily., Disp: 90 tablet,  Rfl: 4   losartan (COZAAR) 100 MG tablet, TAKE 1 TABLET EVERY DAY, Disp: 90 tablet, Rfl: 0   mirabegron ER (MYRBETRIQ) 25 MG TB24 tablet, Take 1 tablet (25 mg total) by mouth daily. (Patient not taking: Reported on 08/20/2020), Disp: 28 tablet, Rfl: 0   Multiple Vitamins-Minerals (EYE VITAMINS PO), Take by mouth., Disp: , Rfl:     Assessment & Plan:  BPH :  on myrbetriq sees urology   HTN : Continue current meds.  Medication compliance emphasised. pt advised to keep Bp logs. Pt verbalised understanding of the same. Pt to have a low salt diet . Exercise to reach a goal of at least 150 mins a week.  lifestyle modifications explained and pt understands importance of the above.  HYPOTHYROIDISM is on synthroid 125 mcg for such  PLEASE TAKE YOUR THYROID MEDICATION FIRST THING IN THE MORNING WHILST FASTING.  NO MEDICATION/ FOOD FOR AN HOUR AFTER INGESTING THYROID PILLS.  Cardiac murmer / dizziness  Will check ECHO Consider cards referral  Pt asymptomatic.   Macular degeneration sees ophthalmology    Problem List Items Addressed This Visit   None    No orders of the defined types were placed in this encounter.    No orders of the defined types were placed in this encounter.    Follow up plan: No follow-ups on file.

## 2020-08-21 LAB — CBC WITH DIFFERENTIAL/PLATELET
Basophils Absolute: 0.1 10*3/uL (ref 0.0–0.2)
Basos: 1 %
EOS (ABSOLUTE): 0.2 10*3/uL (ref 0.0–0.4)
Eos: 3 %
Hematocrit: 41.4 % (ref 37.5–51.0)
Hemoglobin: 14.7 g/dL (ref 13.0–17.7)
Immature Grans (Abs): 0 10*3/uL (ref 0.0–0.1)
Immature Granulocytes: 0 %
Lymphocytes Absolute: 1.7 10*3/uL (ref 0.7–3.1)
Lymphs: 25 %
MCH: 33.5 pg — ABNORMAL HIGH (ref 26.6–33.0)
MCHC: 35.5 g/dL (ref 31.5–35.7)
MCV: 94 fL (ref 79–97)
Monocytes Absolute: 0.6 10*3/uL (ref 0.1–0.9)
Monocytes: 9 %
Neutrophils Absolute: 4.2 10*3/uL (ref 1.4–7.0)
Neutrophils: 62 %
Platelets: 163 10*3/uL (ref 150–450)
RBC: 4.39 x10E6/uL (ref 4.14–5.80)
RDW: 11.9 % (ref 11.6–15.4)
WBC: 6.8 10*3/uL (ref 3.4–10.8)

## 2020-08-21 LAB — COMPREHENSIVE METABOLIC PANEL
ALT: 16 IU/L (ref 0–44)
AST: 19 IU/L (ref 0–40)
Albumin/Globulin Ratio: 1.6 (ref 1.2–2.2)
Albumin: 4.2 g/dL (ref 3.7–4.7)
Alkaline Phosphatase: 88 IU/L (ref 44–121)
BUN/Creatinine Ratio: 16 (ref 10–24)
BUN: 16 mg/dL (ref 8–27)
Bilirubin Total: 0.5 mg/dL (ref 0.0–1.2)
CO2: 22 mmol/L (ref 20–29)
Calcium: 9.6 mg/dL (ref 8.6–10.2)
Chloride: 100 mmol/L (ref 96–106)
Creatinine, Ser: 1.02 mg/dL (ref 0.76–1.27)
Globulin, Total: 2.7 g/dL (ref 1.5–4.5)
Glucose: 120 mg/dL — ABNORMAL HIGH (ref 65–99)
Potassium: 4.1 mmol/L (ref 3.5–5.2)
Sodium: 141 mmol/L (ref 134–144)
Total Protein: 6.9 g/dL (ref 6.0–8.5)
eGFR: 78 mL/min/{1.73_m2} (ref >59)

## 2020-08-21 LAB — TSH: TSH: 1.01 u[IU]/mL (ref 0.450–4.500)

## 2020-08-21 LAB — T4, FREE: Free T4: 1.42 ng/dL (ref 0.82–1.77)

## 2020-08-22 DIAGNOSIS — M9903 Segmental and somatic dysfunction of lumbar region: Secondary | ICD-10-CM | POA: Diagnosis not present

## 2020-08-22 DIAGNOSIS — M9905 Segmental and somatic dysfunction of pelvic region: Secondary | ICD-10-CM | POA: Diagnosis not present

## 2020-08-22 DIAGNOSIS — M5432 Sciatica, left side: Secondary | ICD-10-CM | POA: Diagnosis not present

## 2020-08-22 DIAGNOSIS — M5136 Other intervertebral disc degeneration, lumbar region: Secondary | ICD-10-CM | POA: Diagnosis not present

## 2020-08-26 DIAGNOSIS — M9905 Segmental and somatic dysfunction of pelvic region: Secondary | ICD-10-CM | POA: Diagnosis not present

## 2020-08-26 DIAGNOSIS — M5136 Other intervertebral disc degeneration, lumbar region: Secondary | ICD-10-CM | POA: Diagnosis not present

## 2020-08-26 DIAGNOSIS — M5432 Sciatica, left side: Secondary | ICD-10-CM | POA: Diagnosis not present

## 2020-08-26 DIAGNOSIS — M9903 Segmental and somatic dysfunction of lumbar region: Secondary | ICD-10-CM | POA: Diagnosis not present

## 2020-09-02 DIAGNOSIS — M5432 Sciatica, left side: Secondary | ICD-10-CM | POA: Diagnosis not present

## 2020-09-02 DIAGNOSIS — M9903 Segmental and somatic dysfunction of lumbar region: Secondary | ICD-10-CM | POA: Diagnosis not present

## 2020-09-02 DIAGNOSIS — M5136 Other intervertebral disc degeneration, lumbar region: Secondary | ICD-10-CM | POA: Diagnosis not present

## 2020-09-02 DIAGNOSIS — M9905 Segmental and somatic dysfunction of pelvic region: Secondary | ICD-10-CM | POA: Diagnosis not present

## 2020-09-05 DIAGNOSIS — M5432 Sciatica, left side: Secondary | ICD-10-CM | POA: Diagnosis not present

## 2020-09-05 DIAGNOSIS — M9903 Segmental and somatic dysfunction of lumbar region: Secondary | ICD-10-CM | POA: Diagnosis not present

## 2020-09-05 DIAGNOSIS — M5136 Other intervertebral disc degeneration, lumbar region: Secondary | ICD-10-CM | POA: Diagnosis not present

## 2020-09-05 DIAGNOSIS — M9905 Segmental and somatic dysfunction of pelvic region: Secondary | ICD-10-CM | POA: Diagnosis not present

## 2020-09-09 DIAGNOSIS — M9905 Segmental and somatic dysfunction of pelvic region: Secondary | ICD-10-CM | POA: Diagnosis not present

## 2020-09-09 DIAGNOSIS — M5432 Sciatica, left side: Secondary | ICD-10-CM | POA: Diagnosis not present

## 2020-09-09 DIAGNOSIS — M9903 Segmental and somatic dysfunction of lumbar region: Secondary | ICD-10-CM | POA: Diagnosis not present

## 2020-09-09 DIAGNOSIS — M5136 Other intervertebral disc degeneration, lumbar region: Secondary | ICD-10-CM | POA: Diagnosis not present

## 2020-09-15 DIAGNOSIS — M9905 Segmental and somatic dysfunction of pelvic region: Secondary | ICD-10-CM | POA: Diagnosis not present

## 2020-09-15 DIAGNOSIS — M9903 Segmental and somatic dysfunction of lumbar region: Secondary | ICD-10-CM | POA: Diagnosis not present

## 2020-09-15 DIAGNOSIS — M5136 Other intervertebral disc degeneration, lumbar region: Secondary | ICD-10-CM | POA: Diagnosis not present

## 2020-09-15 DIAGNOSIS — M5432 Sciatica, left side: Secondary | ICD-10-CM | POA: Diagnosis not present

## 2020-09-16 DIAGNOSIS — H353221 Exudative age-related macular degeneration, left eye, with active choroidal neovascularization: Secondary | ICD-10-CM | POA: Diagnosis not present

## 2020-09-26 DIAGNOSIS — M9903 Segmental and somatic dysfunction of lumbar region: Secondary | ICD-10-CM | POA: Diagnosis not present

## 2020-09-26 DIAGNOSIS — M5136 Other intervertebral disc degeneration, lumbar region: Secondary | ICD-10-CM | POA: Diagnosis not present

## 2020-09-26 DIAGNOSIS — M9905 Segmental and somatic dysfunction of pelvic region: Secondary | ICD-10-CM | POA: Diagnosis not present

## 2020-09-26 DIAGNOSIS — M5432 Sciatica, left side: Secondary | ICD-10-CM | POA: Diagnosis not present

## 2020-10-15 ENCOUNTER — Telehealth: Payer: Self-pay

## 2020-10-15 NOTE — Telephone Encounter (Signed)
Patient is due for AWV.Call patient no answer or voicemail to leave a message. 

## 2020-10-17 ENCOUNTER — Telehealth: Payer: Self-pay

## 2020-10-17 NOTE — Telephone Encounter (Signed)
Pt. Ready to schedule colonoscopy 

## 2020-10-20 ENCOUNTER — Telehealth: Payer: Self-pay

## 2020-10-20 NOTE — Telephone Encounter (Signed)
Called patient back spoke with wife hes not home and will call us back

## 2020-10-22 ENCOUNTER — Telehealth: Payer: Self-pay | Admitting: Internal Medicine

## 2020-10-22 NOTE — Telephone Encounter (Signed)
Copied from [place] [phone]. Topic: Medicare AWV >> Oct 22, 2020  4:09 PM Lavonia Drafts wrote: Reason for CRM:  Left message for patient to call back and schedule the Medicare Annual Wellness Visit (AWV) virtually or by telephone.  Last AWV 07/09/19  Please schedule at anytime with [facility]-Nurse Health Advisor.  45 minute appointment  Any questions, please call me at [phone]

## 2020-10-28 ENCOUNTER — Other Ambulatory Visit: Payer: Self-pay

## 2020-10-28 DIAGNOSIS — I10 Essential (primary) hypertension: Secondary | ICD-10-CM

## 2020-10-28 MED ORDER — LOSARTAN POTASSIUM 100 MG PO TABS: 100.0000 mg | ORAL_TABLET | Freq: Every day | ORAL | 3 refills | Status: DC

## 2020-10-31 ENCOUNTER — Ambulatory Visit (INDEPENDENT_AMBULATORY_CARE_PROVIDER_SITE_OTHER): Payer: Medicare Other

## 2020-10-31 VITALS — Ht 66.0 in | Wt 215.0 lb

## 2020-10-31 DIAGNOSIS — Z0000 Encounter for general adult medical examination without abnormal findings: Secondary | ICD-10-CM | POA: Diagnosis not present

## 2020-10-31 NOTE — Patient Instructions (Signed)
Joshua [name] , Thank you for taking time to come for your Medicare Wellness Visit. I appreciate your ongoing commitment to your health goals. Please review the following plan we discussed and let me know if I can assist you in the future.   Screening recommendations/referrals: Colonoscopy: completed 05/29/2020 Recommended yearly ophthalmology/optometry visit for glaucoma screening and checkup Recommended yearly dental visit for hygiene and checkup  Vaccinations: Influenza vaccine: due Pneumococcal vaccine: completed 06/23/2017 Tdap vaccine: completed 02/20/2018, due 02/21/2028 Shingles vaccine: discussed   Covid-19: 11/21/2019, 04/23/2019, 04/02/2019  Advanced directives: Advance directive discussed with you today.   Conditions/risks identified: none  Next appointment: Follow up in one year for your annual wellness visit.   Preventive Care 72 Years and Older, Male Preventive care refers to lifestyle choices and visits with your health care provider that can promote health and wellness. What does preventive care include? A yearly physical exam. This is also called an annual well check. Dental exams once or twice a year. Routine eye exams. Ask your health care provider how often you should have your eyes checked. Personal lifestyle choices, including: Daily care of your teeth and gums. Regular physical activity. Eating a healthy diet. Avoiding tobacco and drug use. Limiting alcohol use. Practicing safe sex. Taking low doses of aspirin every day. Taking vitamin and mineral supplements as recommended by your health care provider. What happens during an annual well check? The services and screenings done by your health care provider during your annual well check will depend on your age, overall health, lifestyle risk factors, and family history of disease. Counseling  Your health care provider may ask you questions about your: Alcohol use. Tobacco use. Drug use. Emotional  well-being. Home and relationship well-being. Sexual activity. Eating habits. History of falls. Memory and ability to understand (cognition). Work and work Statistician. Screening  You may have the following tests or measurements: Height, weight, and BMI. Blood pressure. Lipid and cholesterol levels. These may be checked every 5 years, or more frequently if you are over 31 years old. Skin check. Lung cancer screening. You may have this screening every year starting at age 51 if you have a 30-pack-year history of smoking and currently smoke or have quit within the past 15 years. Fecal occult blood test (FOBT) of the stool. You may have this test every year starting at age 92. Flexible sigmoidoscopy or colonoscopy. You may have a sigmoidoscopy every 5 years or a colonoscopy every 10 years starting at age 35. Prostate cancer screening. Recommendations will vary depending on your family history and other risks. Hepatitis C blood test. Hepatitis B blood test. Sexually transmitted disease (STD) testing. Diabetes screening. This is done by checking your blood sugar (glucose) after you have not eaten for a while (fasting). You may have this done every 1-3 years. Abdominal aortic aneurysm (AAA) screening. You may need this if you are a current or former smoker. Osteoporosis. You may be screened starting at age 48 if you are at high risk. Talk with your health care provider about your test results, treatment options, and if necessary, the need for more tests. Vaccines  Your health care provider may recommend certain vaccines, such as: Influenza vaccine. This is recommended every year. Tetanus, diphtheria, and acellular pertussis (Tdap, Td) vaccine. You may need a Td booster every 10 years. Zoster vaccine. You may need this after age 19. Pneumococcal 13-valent conjugate (PCV13) vaccine. One dose is recommended after age 58. Pneumococcal polysaccharide (PPSV23) vaccine. One dose is recommended after  age 72. Talk to your health care provider about which screenings and vaccines you need and how often you need them. This information is not intended to replace advice given to you by your health care provider. Make sure you discuss any questions you have with your health care provider. Document Released: 02/14/2015 Document Revised: 10/08/2015 Document Reviewed: 11/19/2014 Elsevier Interactive Patient Education  2017 [place] Prevention in the Home Falls can cause injuries. They can happen to people of all ages. There are many things you can do to make your home safe and to help prevent falls. What can I do on the outside of my home? Regularly fix the edges of walkways and driveways and fix any cracks. Remove anything that might make you trip as you walk through a door, such as a raised step or threshold. Trim any bushes or trees on the path to your home. Use bright outdoor lighting. Clear any walking paths of anything that might make someone trip, such as rocks or tools. Regularly check to see if handrails are loose or broken. Make sure that both sides of any steps have handrails. Any raised decks and porches should have guardrails on the edges. Have any leaves, snow, or ice cleared regularly. Use sand or salt on walking paths during winter. Clean up any spills in your garage right away. This includes oil or grease spills. What can I do in the bathroom? Use night lights. Install grab bars by the toilet and in the tub and shower. Do not use towel bars as grab bars. Use non-skid mats or decals in the tub or shower. If you need to sit down in the shower, use a plastic, non-slip stool. Keep the floor dry. Clean up any water that spills on the floor as soon as it happens. Remove soap buildup in the tub or shower regularly. Attach bath mats securely with double-sided non-slip rug tape. Do not have throw rugs and other things on the floor that can make you trip. What can I do in the  bedroom? Use night lights. Make sure that you have a light by your bed that is easy to reach. Do not use any sheets or blankets that are too big for your bed. They should not hang down onto the floor. Have a firm chair that has side arms. You can use this for support while you get dressed. Do not have throw rugs and other things on the floor that can make you trip. What can I do in the kitchen? Clean up any spills right away. Avoid walking on wet floors. Keep items that you use a lot in easy-to-reach places. If you need to reach something above you, use a strong step stool that has a grab bar. Keep electrical cords out of the way. Do not use floor polish or wax that makes floors slippery. If you must use wax, use non-skid floor wax. Do not have throw rugs and other things on the floor that can make you trip. What can I do with my stairs? Do not leave any items on the stairs. Make sure that there are handrails on both sides of the stairs and use them. Fix handrails that are broken or loose. Make sure that handrails are as long as the stairways. Check any carpeting to make sure that it is firmly attached to the stairs. Fix any carpet that is loose or worn. Avoid having throw rugs at the top or bottom of the stairs. If you do have  throw rugs, attach them to the floor with carpet tape. Make sure that you have a light switch at the top of the stairs and the bottom of the stairs. If you do not have them, ask someone to add them for you. What else can I do to help prevent falls? Wear shoes that: Do not have high heels. Have rubber bottoms. Are comfortable and fit you well. Are closed at the toe. Do not wear sandals. If you use a stepladder: Make sure that it is fully opened. Do not climb a closed stepladder. Make sure that both sides of the stepladder are locked into place. Ask someone to hold it for you, if possible. Clearly mark and make sure that you can see: Any grab bars or  handrails. First and last steps. Where the edge of each step is. Use tools that help you move around (mobility aids) if they are needed. These include: Canes. Walkers. Scooters. Crutches. Turn on the lights when you go into a dark area. Replace any light bulbs as soon as they burn out. Set up your furniture so you have a clear path. Avoid moving your furniture around. If any of your floors are uneven, fix them. If there are any pets around you, be aware of where they are. Review your medicines with your doctor. Some medicines can make you feel dizzy. This can increase your chance of falling. Ask your doctor what other things that you can do to help prevent falls. This information is not intended to replace advice given to you by your health care provider. Make sure you discuss any questions you have with your health care provider. Document Released: 11/14/2008 Document Revised: 06/26/2015 Document Reviewed: 02/22/2014 Elsevier Interactive Patient Education  2017 [employer].

## 2020-10-31 NOTE — Progress Notes (Signed)
I connected with Joshua [name] today by telephone and verified that I am speaking with the correct person using two identifiers. Location patient: home Location provider: work Persons participating in the virtual visit: [place]arco, Zaldivar LPN.   I discussed the limitations, risks, security and privacy concerns of performing an evaluation and management service by telephone and the availability of in person appointments. I also discussed with the patient that there may be a patient responsible charge related to this service. The patient expressed understanding and verbally consented to this telephonic visit.    Interactive audio and video telecommunications were attempted between this provider and patient, however failed, due to patient having technical difficulties OR patient did not have access to video capability.  We continued and completed visit with audio only.     Vital signs may be patient reported or missing.  Subjective:   Joshua [name] is a 70 y.o. male who presents for Medicare Annual/Subsequent preventive examination.  Review of Systems     Cardiac Risk Factors include: advanced age (>34men, >37 women);hypertension;male gender;obesity (BMI >30kg/m2);sedentary lifestyle     Objective:    Today's Vitals   10/31/20 1027  Weight: 215 lb (97.5 kg)  Height: 5\' 6"  (1.676 m)  PainSc: 2    Body mass index is 34.7 kg/m.  Advanced Directives 10/31/2020 05/29/2020 07/09/2019 06/28/2018 06/23/2017  Does Patient Have a Medical Advance Directive? No No No No No  Does patient want to make changes to medical advance directive? - - - - Yes (MAU/Ambulatory/Procedural Areas - Information given)  Would patient like information on creating a medical advance directive? - No - Patient declined Yes (Inpatient - patient defers creating a medical advance directive at this time - Information given) Yes (MAU/Ambulatory/Procedural Areas - Information given) -    Current Medications  (verified) Outpatient Encounter Medications as of 10/31/2020  Medication Sig   levothyroxine (SYNTHROID) 125 MCG tablet Take 1 tablet (125 mcg total) by mouth daily.   losartan (COZAAR) 100 MG tablet Take 1 tablet (100 mg total) by mouth daily.   Multiple Vitamins-Minerals (EYE VITAMINS PO) Take by mouth.   fluticasone (FLONASE) 50 MCG/ACT nasal spray Place 2 sprays into both nostrils daily. (Patient not taking: Reported on 10/31/2020)   mirabegron ER (MYRBETRIQ) 25 MG TB24 tablet Take 1 tablet (25 mg total) by mouth daily. (Patient not taking: No sig reported)   No facility-administered encounter medications on file as of 10/31/2020.    Allergies (verified) Aspirin, Celery oil, Flomax [tamsulosin hcl], Hydromorphone, Oysters [shellfish allergy], Amlodipine besylate, and Latex   History: Past Medical History:  Diagnosis Date   Allergy    Amnesia 06/13/2014   B12 deficiency 06/13/2014   Benign essential HTN 10/30/2014   BPH (benign prostatic hyperplasia)    BPH with obstruction/lower urinary tract symptoms 05/15/2015   History of kidney stones    Hypogonadism in male    Hypothyroidism 10/30/2014   Past Surgical History:  Procedure Laterality Date   COLONOSCOPY  March 2016   COLONOSCOPY WITH PROPOFOL N/A 05/29/2020   Procedure: COLONOSCOPY WITH PROPOFOL;  Surgeon: Jonathon Bellows, MD;  Location: [facility] ENDOSCOPY;  Service: Gastroenterology;  Laterality: N/A;   ESOPHAGOGASTRODUODENOSCOPY ENDOSCOPY  March 2016   EYE SURGERY Bilateral    cataracts   KNEE SURGERY Right    SHOULDER SURGERY Left    TONSILLECTOMY     Family History  Problem Relation Age of Onset   Breast cancer Mother    COPD Father    Heart  disease Father    Prostate cancer Father    Social History   Socioeconomic History   Marital status: Married    Spouse name: Not on file   Number of children: Not on file   Years of education: Not on file   Highest education level: High school graduate  Occupational History   Not  on file  Tobacco Use   Smoking status: Never   Smokeless tobacco: Never  Vaping Use   Vaping Use: Never used  Substance and Sexual Activity   Alcohol use: Yes    Alcohol/week: 0.0 standard drinks    Comment: Rarely   Drug use: No   Sexual activity: Not Currently  Other Topics Concern   Not on file  Social History Narrative   Not on file   Social Determinants of Health   Financial Resource Strain: Low Risk    Difficulty of Paying Living Expenses: Not hard at all  Food Insecurity: No Food Insecurity   Worried About Charity fundraiser in the Last Year: Never true   [place] in the Last Year: Never true  Transportation Needs: No Transportation Needs   Lack of Transportation (Medical): No   Lack of Transportation (Non-Medical): No  Physical Activity: Inactive   Days of Exercise per Week: 0 days   Minutes of Exercise per Session: 0 min  Stress: No Stress Concern Present   Feeling of Stress : Not at all  Social Connections: Not on file    Tobacco Counseling Counseling given: Not Answered   Clinical Intake:  Pre-visit preparation completed: Yes  Pain : 0-10 Pain Score: 2  Pain Type: Chronic pain Pain Location: Generalized Pain Descriptors / Indicators: Aching Pain Onset: More than a month ago Pain Frequency: Intermittent     Nutritional Status: BMI > 30  Obese Nutritional Risks: None Diabetes: No  How often do you need to have someone help you when you read instructions, pamphlets, or other written materials from your doctor or pharmacy?: 1 - Never What is the last grade level you completed in school?: 12th grade  Diabetic? no  Interpreter Needed?: No  Information entered by :: NAllen LPN   Activities of Daily Living In your present state of health, do you have any difficulty performing the following activities: 10/31/2020  Hearing? Y  Comment have hearing aides  Vision? N  Difficulty concentrating or making decisions? N  Walking or climbing  stairs? N  Dressing or bathing? N  Doing errands, shopping? N  Preparing Food and eating ? N  Using the Toilet? N  In the past six months, have you accidently leaked urine? Y  Do you have problems with loss of bowel control? N  Managing your Medications? N  Managing your Finances? N  Housekeeping or managing your Housekeeping? N  Some recent data might be hidden    Patient Care Team: Charlynne Cousins, MD as PCP - General Manya Silvas, MD (Inactive) (Gastroenterology) Dawayne Patricia, MD as Referring Physician (Orthopedic Surgery) Carloyn Manner, MD as Referring Physician (Otolaryngology)  Indicate any recent Medical Services you may have received from other than [facility] providers in the past year (date may be approximate).     Assessment:   This is a routine wellness examination for Joshua [name].  Hearing/Vision screen Vision Screening - Comments:: Regular eye exams,   Dietary issues and exercise activities discussed: Current Exercise Habits: The patient does not participate in regular exercise at present   Goals Addressed  This Visit's Progress    Patient Stated       10/31/2020, wants to lose weight       Depression Screen PHQ 2/9 Scores 10/31/2020 08/20/2020 06/09/2020 05/21/2020 04/23/2020 07/09/2019 06/28/2018  PHQ - 2 Score 0 0 0 0 0 0 0    Fall Risk Fall Risk  10/31/2020 08/20/2020 06/09/2020 05/21/2020 04/23/2020  Falls in the past year? 0 0 0 0 1  Number falls in past yr: - 0 0 - 0  Injury with Fall? - 0 0 - 0  Risk for fall due to : Medication side effect No Fall Risks - - No Fall Risks  Follow up Falls evaluation completed;Education provided;Falls prevention discussed Falls evaluation completed Falls evaluation completed - -    FALL RISK PREVENTION PERTAINING TO THE HOME:  Any stairs in or around the home? No  If so, are there any without handrails?  N/a Home free of loose throw rugs in walkways, pet beds, electrical cords, etc? Yes  Adequate lighting  in your home to reduce risk of falls? Yes   ASSISTIVE DEVICES UTILIZED TO PREVENT FALLS:  Life alert? No  Use of a cane, walker or w/c? No  Grab bars in the bathroom? Yes  Shower chair or bench in shower? Yes  Elevated toilet seat or a handicapped toilet? Yes   TIMED UP AND GO:  Was the test performed? No .      Cognitive Function:     6CIT Screen 10/31/2020 07/09/2019 06/28/2018 06/23/2017  What Year? 0 points 0 points 0 points 0 points  What month? 0 points 0 points 0 points 0 points  What time? 0 points 0 points 0 points 0 points  Count back from 20 0 points 0 points 0 points 0 points  Months in reverse 2 points 4 points 0 points 0 points  Repeat phrase 4 points 0 points 0 points 0 points  Total Score 6 4 0 0    Immunizations Immunization History  Administered Date(s) Administered   Influenza, High Dose Seasonal PF 10/30/2014, 11/06/2015, 11/08/2016, 10/31/2017, 11/21/2019   Influenza,inj,Quad PF,6+ Mos 10/30/2014, 11/01/2018   PFIZER Comirnaty(Gray Top)Covid-19 Tri-Sucrose Vaccine 11/21/2019   PFIZER(Purple Top)SARS-COV-2 Vaccination 04/02/2019, 04/23/2019   Pneumococcal Conjugate-13 10/29/2013   Pneumococcal Polysaccharide-23 06/23/2017   Td 10/11/2007, 02/20/2018   Zoster, Live 02/24/2011    TDAP status: Up to date  Flu Vaccine status: Due, Education has been provided regarding the importance of this vaccine. Advised may receive this vaccine at local pharmacy or Health Dept. Aware to provide a copy of the vaccination record if obtained from local pharmacy or Health Dept. Verbalized acceptance and understanding.  Pneumococcal vaccine status: Up to date  Covid-19 vaccine status: Completed vaccines  Qualifies for Shingles Vaccine? Yes   Zostavax completed Yes   Shingrix Completed?: No.    Education has been provided regarding the importance of this vaccine. Patient has been advised to call insurance company to determine out of pocket expense if they have not yet  received this vaccine. Advised may also receive vaccine at local pharmacy or Health Dept. Verbalized acceptance and understanding.  Screening Tests Health Maintenance  Topic Date Due   Zoster Vaccines- Shingrix (1 of 2) Never done   COVID-19 Vaccine (4 - Booster for Pfizer series) 03/23/2020   INFLUENZA VACCINE  09/01/2020   TETANUS/TDAP  02/21/2028   COLONOSCOPY (Pts 45-64yrs Insurance coverage will need to be confirmed)  05/30/2030   Hepatitis C Screening  Completed   HPV VACCINES  Aged Out    Health Maintenance  Health Maintenance Due  Topic Date Due   Zoster Vaccines- Shingrix (1 of 2) Never done   COVID-19 Vaccine (4 - Booster for Pfizer series) 03/23/2020   INFLUENZA VACCINE  09/01/2020    Colorectal cancer screening: Type of screening: Colonoscopy. Completed 05/29/2020. Repeat every 1 years  Lung Cancer Screening: (Low Dose CT Chest recommended if Age 24-80 years, 30 pack-year currently smoking OR have quit w/in 15years.) does not qualify.   Lung Cancer Screening Referral: no  Additional Screening:  Hepatitis C Screening: does qualify; Completed 06/23/2017  Vision Screening: Recommended annual ophthalmology exams for early detection of glaucoma and other disorders of the eye. Is the patient up to date with their annual eye exam?  Yes  Who is the provider or what is the name of the office in which the patient attends annual eye exams? Can't remember the name If pt is not established with a provider, would they like to be referred to a provider to establish care? No .   Dental Screening: Recommended annual dental exams for proper oral hygiene  Community Resource Referral / Chronic Care Management: CRR required this visit?  No   CCM required this visit?  No      Plan:     I have personally reviewed and noted the following in the patient's chart:   Medical and social history Use of alcohol, tobacco or illicit drugs  Current medications and supplements including  opioid prescriptions. Patient is not currently taking opioid prescriptions. Functional ability and status Nutritional status Physical activity Advanced directives List of other physicians Hospitalizations, surgeries, and ER visits in previous 12 months Vitals Screenings to include cognitive, depression, and falls Referrals and appointments  In addition, I have reviewed and discussed with patient certain preventive protocols, quality metrics, and best practice recommendations. A written personalized care plan for preventive services as well as general preventive health recommendations were provided to patient.     Kellie Simmering, LPN   07/08/3014   Nurse Notes:

## 2020-11-13 ENCOUNTER — Other Ambulatory Visit: Payer: Self-pay

## 2020-11-13 ENCOUNTER — Other Ambulatory Visit: Payer: Medicare Other

## 2020-11-13 ENCOUNTER — Telehealth: Payer: Self-pay

## 2020-11-13 DIAGNOSIS — N401 Enlarged prostate with lower urinary tract symptoms: Secondary | ICD-10-CM

## 2020-11-13 DIAGNOSIS — N3943 Post-void dribbling: Secondary | ICD-10-CM

## 2020-11-13 DIAGNOSIS — E039 Hypothyroidism, unspecified: Secondary | ICD-10-CM | POA: Diagnosis not present

## 2020-11-13 DIAGNOSIS — Z136 Encounter for screening for cardiovascular disorders: Secondary | ICD-10-CM | POA: Diagnosis not present

## 2020-11-13 DIAGNOSIS — I10 Essential (primary) hypertension: Secondary | ICD-10-CM | POA: Diagnosis not present

## 2020-11-13 DIAGNOSIS — R011 Cardiac murmur, unspecified: Secondary | ICD-10-CM | POA: Diagnosis not present

## 2020-11-13 NOTE — Telephone Encounter (Signed)
Patient is calling because he is ready to schedule his 6 month follow up colonoscopy. He states that he has been trying to get this schedule for a month and no one has called him back. He states he will be back home in about 30 minutes

## 2020-11-13 NOTE — Telephone Encounter (Signed)
Patient was contacted again and left him a voicemail letting him know that we have tried to contact him as well and no luck from our end. Hopefully he calls back soon.

## 2020-11-14 LAB — CMP14+EGFR
ALT: 14 IU/L (ref 0–44)
AST: 20 IU/L (ref 0–40)
Albumin/Globulin Ratio: 1.8 (ref 1.2–2.2)
Albumin: 4.2 g/dL (ref 3.7–4.7)
Alkaline Phosphatase: 83 IU/L (ref 44–121)
BUN/Creatinine Ratio: 13 (ref 10–24)
BUN: 12 mg/dL (ref 8–27)
Bilirubin Total: 0.4 mg/dL (ref 0.0–1.2)
CO2: 25 mmol/L (ref 20–29)
Calcium: 9.3 mg/dL (ref 8.6–10.2)
Chloride: 103 mmol/L (ref 96–106)
Creatinine, Ser: 0.9 mg/dL (ref 0.76–1.27)
Globulin, Total: 2.4 g/dL (ref 1.5–4.5)
Glucose: 101 mg/dL — ABNORMAL HIGH (ref 70–99)
Potassium: 4.3 mmol/L (ref 3.5–5.2)
Sodium: 140 mmol/L (ref 134–144)
Total Protein: 6.6 g/dL (ref 6.0–8.5)
eGFR: 91 mL/min/{1.73_m2} (ref >59)

## 2020-11-14 LAB — PSA TOTAL+% FREE (SERIAL)
PSA, Free Pct: 19.6 %
PSA, Free: 0.47 ng/mL
Prostate Specific Ag, Serum: 2.4 ng/mL (ref 0.0–4.0)

## 2020-11-14 LAB — LIPID PANEL
Chol/HDL Ratio: 3.8 ratio (ref 0.0–5.0)
Cholesterol, Total: 180 mg/dL (ref 100–199)
HDL: 48 mg/dL (ref >39)
LDL Chol Calc (NIH): 107 mg/dL — ABNORMAL HIGH (ref 0–99)
Triglycerides: 142 mg/dL (ref 0–149)
VLDL Cholesterol Cal: 25 mg/dL (ref 5–40)

## 2020-11-14 LAB — THYROID PANEL WITH TSH
Free Thyroxine Index: 2 (ref 1.2–4.9)
T3 Uptake Ratio: 28 % (ref 24–39)
T4, Total: 7.1 ug/dL (ref 4.5–12.0)
TSH: 2.62 u[IU]/mL (ref 0.450–4.500)

## 2020-11-18 DIAGNOSIS — H35432 Paving stone degeneration of retina, left eye: Secondary | ICD-10-CM | POA: Diagnosis not present

## 2020-11-18 DIAGNOSIS — H35033 Hypertensive retinopathy, bilateral: Secondary | ICD-10-CM | POA: Diagnosis not present

## 2020-11-18 DIAGNOSIS — H353221 Exudative age-related macular degeneration, left eye, with active choroidal neovascularization: Secondary | ICD-10-CM | POA: Diagnosis not present

## 2020-11-19 ENCOUNTER — Other Ambulatory Visit: Payer: Self-pay

## 2020-11-19 DIAGNOSIS — Z86010 Personal history of colonic polyps: Secondary | ICD-10-CM

## 2020-11-19 MED ORDER — NA SULFATE-K SULFATE-MG SULF 17.5-3.13-1.6 GM/177ML PO SOLN: 354.0000 mL | Freq: Once | ORAL | 0 refills | Status: AC

## 2020-11-19 NOTE — Telephone Encounter (Signed)
Called patient back and he wanted to have his colonoscopy done on 01/06/2021. I told him that I would mail him his paperwork with instructions. Patient understood and had no further questions.

## 2020-11-19 NOTE — Addendum Note (Signed)
Addended by: Wayna Chalet on: 11/19/2020 04:37 PM   Modules accepted: Orders

## 2020-11-20 ENCOUNTER — Ambulatory Visit (INDEPENDENT_AMBULATORY_CARE_PROVIDER_SITE_OTHER): Payer: Medicare Other | Admitting: Internal Medicine

## 2020-11-20 ENCOUNTER — Other Ambulatory Visit: Payer: Self-pay

## 2020-11-20 ENCOUNTER — Encounter: Payer: Self-pay | Admitting: Internal Medicine

## 2020-11-20 VITALS — BP 138/89 | HR 61 | Temp 98.0°F | Ht 66.14 in | Wt 219.4 lb

## 2020-11-20 DIAGNOSIS — M25511 Pain in right shoulder: Secondary | ICD-10-CM | POA: Diagnosis not present

## 2020-11-20 DIAGNOSIS — E039 Hypothyroidism, unspecified: Secondary | ICD-10-CM

## 2020-11-20 DIAGNOSIS — I10 Essential (primary) hypertension: Secondary | ICD-10-CM | POA: Diagnosis not present

## 2020-11-20 DIAGNOSIS — M25512 Pain in left shoulder: Secondary | ICD-10-CM

## 2020-11-20 DIAGNOSIS — Z23 Encounter for immunization: Secondary | ICD-10-CM

## 2020-11-20 NOTE — Progress Notes (Signed)
BP 138/89   Pulse 61   Temp 98 F (36.7 C) (Oral)   Ht 5' 6.14" (1.68 m)   Wt 219 lb 6.4 oz (99.5 kg)   SpO2 98%   BMI 35.26 kg/m    Subjective:    Patient ID: Joshua [name], male    DOB: [birth year], 70 y.o.   MRN: [id number]  Chief Complaint  Patient presents with   Hypothyroidism   Prediabetes   Shoulder Pain    Used a pole pruner last weekend and now b/l shoulders are painful    HPI: Joshua [name] is a 70 y.o. 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 The current episode started more than 1 year ago. Pertinent negatives include no fever or numbness.   Chief Complaint  Patient presents with   Hypothyroidism   Prediabetes   Shoulder Pain    Used a pole pruner last weekend and now b/l shoulders are painful    Relevant past medical, surgical, family and social history reviewed and updated as indicated. Interim medical history since our last visit reviewed. Allergies and medications reviewed and updated.  Review of Systems  Constitutional:  Negative for activity change, appetite change, chills, fatigue and fever.  HENT:  Negative for congestion, ear discharge, ear pain and facial swelling.   Eyes:  Negative for pain, discharge and itching.  Respiratory:  Negative for cough, chest tightness, shortness of breath and wheezing.   Cardiovascular:  Negative for chest pain, palpitations and leg swelling.  Gastrointestinal:  Negative for abdominal distention, abdominal pain, blood in stool, constipation, diarrhea, nausea and vomiting.  Endocrine: Negative for cold intolerance, heat intolerance, polydipsia, polyphagia and polyuria.  Genitourinary:  Negative for difficulty urinating, dysuria, flank pain, frequency, hematuria and urgency.  Musculoskeletal:  Positive for arthralgias. Negative for gait problem, joint swelling and myalgias.   Skin:  Negative for color change, rash and wound.  Neurological:  Negative for dizziness, tremors, speech difficulty, weakness, light-headedness, numbness and headaches.  Hematological:  Does not bruise/bleed easily.  Psychiatric/Behavioral:  Negative for agitation, confusion, decreased concentration, dysphoric mood, hallucinations, self-injury, sleep disturbance and suicidal ideas. The patient is not nervous/anxious and is not hyperactive.    Per HPI unless specifically indicated above     Objective:    BP 138/89   Pulse 61   Temp 98 F (36.7 C) (Oral)   Ht 5' 6.14" (1.68 m)   Wt 219 lb 6.4 oz (99.5 kg)   SpO2 98%   BMI 35.26 kg/m   Wt Readings from Last 3 Encounters:  11/20/20 219 lb 6.4 oz (99.5 kg)  10/31/20 215 lb (97.5 kg)  08/20/20 215 lb (97.5 kg)    Physical Exam Vitals and nursing note reviewed.  Constitutional:      General: He is not in acute distress.    Appearance: Normal appearance. He is not ill-appearing or diaphoretic.  HENT:     Head: Normocephalic and atraumatic.     Right Ear: Tympanic membrane and external ear normal. There is no impacted cerumen.     Left Ear: External ear normal.     Nose: No congestion or rhinorrhea.     Mouth/Throat:     Pharynx: No oropharyngeal exudate or posterior oropharyngeal erythema.  Eyes:     Conjunctiva/sclera: Conjunctivae normal.  Pupils: Pupils are equal, round, and reactive to light.  Cardiovascular:     Rate and Rhythm: Normal rate and regular rhythm.     Heart sounds: No murmur heard.   No friction rub. No gallop.  Pulmonary:     Effort: No respiratory distress.     Breath sounds: No stridor. No wheezing or rhonchi.  Chest:     Chest wall: No tenderness.  Abdominal:     General: Abdomen is flat. Bowel sounds are normal.     Palpations: Abdomen is soft. There is no mass.     Tenderness: There is no abdominal tenderness.  Musculoskeletal:        General: Tenderness present. No swelling, deformity or  signs of injury.     Cervical back: Normal range of motion and neck supple. No rigidity or tenderness.     Right lower leg: No edema.     Left lower leg: No edema.  Skin:    General: Skin is warm and dry.  Neurological:     Mental Status: He is alert.  Psychiatric:        Mood and Affect: Mood normal.        Thought Content: Thought content normal.   Results for orders placed or performed in visit on 11/13/20  Lipid panel  Result Value Ref Range   Cholesterol, Total 180 100 - 199 mg/dL   Triglycerides 142 0 - 149 mg/dL   HDL 48 >39 mg/dL   VLDL Cholesterol Cal 25 5 - 40 mg/dL   LDL Chol Calc (NIH) 107 (H) 0 - 99 mg/dL   Chol/HDL Ratio 3.8 0.0 - 5.0 ratio  Thyroid Panel With TSH  Result Value Ref Range   TSH 2.620 0.450 - 4.500 uIU/mL   T4, Total 7.1 4.5 - 12.0 ug/dL   T3 Uptake Ratio 28 24 - 39 %   Free Thyroxine Index 2.0 1.2 - 4.9  PSA Total+%Free (Serial)  Result Value Ref Range   Prostate Specific Ag, Serum 2.4 0.0 - 4.0 ng/mL   PSA, Free 0.47 N/A ng/mL   PSA, Free Pct 19.6 %  CMP14+EGFR  Result Value Ref Range   Glucose 101 (H) 70 - 99 mg/dL   BUN 12 8 - 27 mg/dL   Creatinine, Ser 0.90 0.76 - 1.27 mg/dL   eGFR 91 >59 mL/min/1.73   BUN/Creatinine Ratio 13 10 - 24   Sodium 140 134 - 144 mmol/L   Potassium 4.3 3.5 - 5.2 mmol/L   Chloride 103 96 - 106 mmol/L   CO2 25 20 - 29 mmol/L   Calcium 9.3 8.6 - 10.2 mg/dL   Total Protein 6.6 6.0 - 8.5 g/dL   Albumin 4.2 3.7 - 4.7 g/dL   Globulin, Total 2.4 1.5 - 4.5 g/dL   Albumin/Globulin Ratio 1.8 1.2 - 2.2   Bilirubin Total 0.4 0.0 - 1.2 mg/dL   Alkaline Phosphatase 83 44 - 121 IU/L   AST 20 0 - 40 IU/L   ALT 14 0 - 44 IU/L        Current Outpatient Medications:    levothyroxine (SYNTHROID) 125 MCG tablet, Take 1 tablet (125 mcg total) by mouth daily., Disp: 90 tablet, Rfl: 4   losartan (COZAAR) 100 MG tablet, Take 1 tablet (100 mg total) by mouth daily., Disp: 90 tablet, Rfl: 3   Multiple Vitamins-Minerals (EYE  VITAMINS PO), Take by mouth., Disp: , Rfl:    traMADol (ULTRAM) 50 MG tablet, Take 1 tablet (50 mg total)  by mouth every 12 (twelve) hours as needed for up to 7 days., Disp: 15 tablet, Rfl: 0    Assessment & Plan:  Hypothyroidism is on 125 mcg of synthroid  PLEASE TAKE YOUR THYROID MEDICATION FIRST THING IN THE MORNING WHILST FASTING.  NO MEDICATION/ FOOD FOR AN HOUR AFTER INGESTING THYROID PILLS.  2. HTN : is on losartan 100 mg.  Continue current meds.  Medication compliance emphasised. pt advised to keep Bp logs. Pt verbalised understanding of the same. Pt to have a low salt diet . Exercise to reach a goal of at least 150 mins a week.  lifestyle modifications explained and pt understands importance of the above.  3. Shoulder pain : ? Sec to lifting wife  Has had a left humeral # in the past.   Problem List Items Addressed This Visit       Cardiovascular and Mediastinum   Hypertension     Endocrine   Hypothyroidism     Other   Need for influenza vaccination - Primary   Relevant Orders   Flu Vaccine QUAD High Dose(Fluad) (Completed)   Bilateral shoulder pain     Orders Placed This Encounter  Procedures   Flu Vaccine QUAD High Dose(Fluad)     Meds ordered this encounter  Medications   traMADol (ULTRAM) 50 MG tablet    Sig: Take 1 tablet (50 mg total) by mouth every 12 (twelve) hours as needed for up to 7 days.    Dispense:  15 tablet    Refill:  0     Follow up plan: No follow-ups on file.

## 2020-11-21 ENCOUNTER — Ambulatory Visit (INDEPENDENT_AMBULATORY_CARE_PROVIDER_SITE_OTHER): Payer: Medicare Other

## 2020-11-21 DIAGNOSIS — M25511 Pain in right shoulder: Secondary | ICD-10-CM

## 2020-11-21 MED ORDER — KETOROLAC TROMETHAMINE 60 MG/2ML IM SOLN
60.0000 mg | Freq: Once | INTRAMUSCULAR | Status: AC
  Administered 2020-11-21: 60 mg via INTRAMUSCULAR

## 2020-11-21 MED ORDER — TRAMADOL HCL 50 MG PO TABS: 50.0000 mg | ORAL_TABLET | Freq: Two times a day (BID) | ORAL | 0 refills | Status: AC | PRN

## 2020-11-21 NOTE — Progress Notes (Addendum)
Patient returns today for his Toradol injection per Dr. Neomia Dear. Patient received injection in Right upper outer quadrant, patient tolerated well

## 2020-11-25 DIAGNOSIS — M9903 Segmental and somatic dysfunction of lumbar region: Secondary | ICD-10-CM | POA: Diagnosis not present

## 2020-11-25 DIAGNOSIS — M5136 Other intervertebral disc degeneration, lumbar region: Secondary | ICD-10-CM | POA: Diagnosis not present

## 2020-11-25 DIAGNOSIS — M9905 Segmental and somatic dysfunction of pelvic region: Secondary | ICD-10-CM | POA: Diagnosis not present

## 2020-11-25 DIAGNOSIS — M5432 Sciatica, left side: Secondary | ICD-10-CM | POA: Diagnosis not present

## 2020-11-26 ENCOUNTER — Telehealth: Payer: Self-pay | Admitting: Internal Medicine

## 2020-11-26 NOTE — Telephone Encounter (Signed)
Called patient, he is still experiencing shoulder pain and has requested an appointment with Dr. Neomia Dear to discuss further.  Patient has been scheduled tomorrow, 11/27/2020.

## 2020-11-26 NOTE — Telephone Encounter (Signed)
Dr. Neomia Dear,  Can you please advise when patient needs to follow up with you? -CC   Copied from [place] [id number]. Topic: Appointment Scheduling - Scheduling Inquiry for Clinic >> Nov 25, 2020  3:42 PM Oneta Rack wrote: Reason for CRM: patient unsure when PCP would like him to follow up with her ?   Patient states shoulder injection relieved pain.   Patient was seen by chiropractor who adjusted shoulder which relieved pain.   Now pain is back, chiropractor advised patient to use a ice pack.   Patient wanted PCP to know his colonoscopy is scheduled for 01/06/2021.

## 2020-11-27 ENCOUNTER — Ambulatory Visit (INDEPENDENT_AMBULATORY_CARE_PROVIDER_SITE_OTHER): Payer: Medicare Other | Admitting: Internal Medicine

## 2020-11-27 ENCOUNTER — Ambulatory Visit
Admission: RE | Admit: 2020-11-27 | Discharge: 2020-11-27 | Disposition: A | Discharge status: Home / Self Care | Payer: Medicare Other | Source: Home / Self Care | Attending: Internal Medicine | Admitting: Internal Medicine

## 2020-11-27 ENCOUNTER — Ambulatory Visit
Admission: RE | Admit: 2020-11-27 | Discharge: 2020-11-27 | Disposition: A | Discharge status: Home / Self Care | Payer: Medicare Other | Source: Ambulatory Visit | Attending: Internal Medicine | Admitting: Internal Medicine

## 2020-11-27 ENCOUNTER — Other Ambulatory Visit: Payer: Self-pay

## 2020-11-27 VITALS — BP 148/80 | HR 59 | Temp 98.4°F | Ht 66.5 in | Wt 220.1 lb

## 2020-11-27 DIAGNOSIS — M25511 Pain in right shoulder: Secondary | ICD-10-CM

## 2020-11-27 MED ORDER — METHYLPREDNISOLONE SODIUM SUCC 40 MG IJ SOLR
120.0000 mg | Freq: Once | INTRAMUSCULAR | Status: AC
  Administered 2020-11-27: 120 mg via INTRAMUSCULAR

## 2020-11-27 MED ORDER — METHYLPREDNISOLONE 4 MG PO TBPK: ORAL_TABLET | ORAL | 0 refills | Status: DC

## 2020-11-27 NOTE — Progress Notes (Signed)
BP (!) 148/80   Pulse (!) 59   Temp 98.4 F (36.9 C) (Oral)   Ht 5' 6.5" (1.689 m)   Wt 220 lb 2 oz (99.8 kg)   SpO2 93%   BMI 35.00 kg/m    Subjective:    Patient ID: Joshua [name], male    DOB: [birth year], 70 y.o.   MRN: [id number]  Chief Complaint  Patient presents with  . Pain Management    Follow up for R shoulder pain    HPI: Joshua [name] is a 70 y.o. male  Shoulder Pain  The pain is present in the right shoulder. This is a chronic problem. The current episode started more than 1 month ago. The pain is at a severity of 5/10. Associated symptoms include joint locking and a limited range of motion. Pertinent negatives include no fever, inability to bear weight, itching or joint swelling.   Chief Complaint  Patient presents with  . Pain Management    Follow up for R shoulder pain    Relevant past medical, surgical, family and social history reviewed and updated as indicated. Interim medical history since our last visit reviewed. Allergies and medications reviewed and updated.  Review of Systems  Constitutional:  Negative for fever.  Skin:  Negative for itching.   Per HPI unless specifically indicated above     Objective:    BP (!) 148/80   Pulse (!) 59   Temp 98.4 F (36.9 C) (Oral)   Ht 5' 6.5" (1.689 m)   Wt 220 lb 2 oz (99.8 kg)   SpO2 93%   BMI 35.00 kg/m   Wt Readings from Last 3 Encounters:  12/01/20 220 lb 3.2 oz (99.9 kg)  11/27/20 220 lb 2 oz (99.8 kg)  11/20/20 219 lb 6.4 oz (99.5 kg)    Physical Exam Vitals and nursing note reviewed.  Constitutional:      General: He is not in acute distress.    Appearance: Normal appearance. He is not ill-appearing or diaphoretic.  HENT:     Head: Normocephalic and atraumatic.     Right Ear: Tympanic membrane and external ear normal.     Left Ear: External ear normal.  Eyes:     Conjunctiva/sclera: Conjunctivae normal.     Pupils: Pupils are equal, round, and reactive to light.   Cardiovascular:     Rate and Rhythm: Normal rate and regular rhythm.  Musculoskeletal:        General: Tenderness present. No swelling, deformity or signs of injury.     Cervical back: Normal range of motion and neck supple. No rigidity or tenderness.     Right lower leg: No edema.  Skin:    General: Skin is warm and dry.  Neurological:     Mental Status: He is alert.   Results for orders placed or performed in visit on 11/13/20  Lipid panel  Result Value Ref Range   Cholesterol, Total 180 100 - 199 mg/dL   Triglycerides 142 0 - 149 mg/dL   HDL 48 >39 mg/dL   VLDL Cholesterol Cal 25 5 - 40 mg/dL   LDL Chol Calc (NIH) 107 (H) 0 - 99 mg/dL   Chol/HDL Ratio 3.8 0.0 - 5.0 ratio  Thyroid Panel With TSH  Result Value Ref Range   TSH 2.620 0.450 - 4.500 uIU/mL   T4, Total 7.1 4.5 - 12.0 ug/dL   T3 Uptake Ratio 28 24 - 39 %   Free Thyroxine Index 2.0  1.2 - 4.9  PSA Total+%Free (Serial)  Result Value Ref Range   Prostate Specific Ag, Serum 2.4 0.0 - 4.0 ng/mL   PSA, Free 0.47 N/A ng/mL   PSA, Free Pct 19.6 %  CMP14+EGFR  Result Value Ref Range   Glucose 101 (H) 70 - 99 mg/dL   BUN 12 8 - 27 mg/dL   Creatinine, Ser 0.90 0.76 - 1.27 mg/dL   eGFR 91 >59 mL/min/1.73   BUN/Creatinine Ratio 13 10 - 24   Sodium 140 134 - 144 mmol/L   Potassium 4.3 3.5 - 5.2 mmol/L   Chloride 103 96 - 106 mmol/L   CO2 25 20 - 29 mmol/L   Calcium 9.3 8.6 - 10.2 mg/dL   Total Protein 6.6 6.0 - 8.5 g/dL   Albumin 4.2 3.7 - 4.7 g/dL   Globulin, Total 2.4 1.5 - 4.5 g/dL   Albumin/Globulin Ratio 1.8 1.2 - 2.2   Bilirubin Total 0.4 0.0 - 1.2 mg/dL   Alkaline Phosphatase 83 44 - 121 IU/L   AST 20 0 - 40 IU/L   ALT 14 0 - 44 IU/L        Current Outpatient Medications:  .  levothyroxine (SYNTHROID) 125 MCG tablet, Take 1 tablet (125 mcg total) by mouth daily., Disp: 90 tablet, Rfl: 4 .  losartan (COZAAR) 100 MG tablet, Take 1 tablet (100 mg total) by mouth daily., Disp: 90 tablet, Rfl: 3 .   methylPREDNISolone (MEDROL DOSEPAK) 4 MG TBPK tablet, Use as directed, Disp: 1 each, Rfl: 0 .  Multiple Vitamins-Minerals (EYE VITAMINS PO), Take by mouth., Disp: , Rfl:     Assessment & Plan:  Right shoulde rpain ? Needs PT declines at this point Will need to start pt on ultram and medrol dose pak  Check Xray  Mild degenerative changes over the [facility] joint and glenohumeral joints. No acute fracture or dislocation. No focal bony abnormality.  Xray :  1. No acute findings. 2. Mild degenerative changes.   Problem List Items Addressed This Visit   None Visit Diagnoses     Right shoulder pain, unspecified chronicity    -  Primary   Relevant Medications   methylPREDNISolone sodium succinate (SOLU-MEDROL) 40 mg/mL injection 120 mg (Completed)   Other Relevant Orders   DG Shoulder Right (Completed)        Orders Placed This Encounter  Procedures  . DG Shoulder Right     Meds ordered this encounter  Medications  . methylPREDNISolone (MEDROL DOSEPAK) 4 MG TBPK tablet    Sig: Use as directed    Dispense:  1 each    Refill:  0  . methylPREDNISolone sodium succinate (SOLU-MEDROL) 40 mg/mL injection 120 mg     Follow up plan: No follow-ups on file.

## 2020-11-28 ENCOUNTER — Telehealth: Payer: Self-pay | Admitting: Internal Medicine

## 2020-11-28 NOTE — Telephone Encounter (Signed)
Just an fyi.  Copied from [place] [id number]. Topic: General - Inquiry >> Nov 27, 2020  4:33 PM Greggory Keen D wrote: Reason for CRM: Pt wanted to let Dr. Neomia Dear now he got the tramadol in the mail today from [place]

## 2020-12-01 ENCOUNTER — Ambulatory Visit: Payer: Self-pay | Admitting: *Deleted

## 2020-12-01 ENCOUNTER — Other Ambulatory Visit: Payer: Self-pay

## 2020-12-01 ENCOUNTER — Ambulatory Visit (INDEPENDENT_AMBULATORY_CARE_PROVIDER_SITE_OTHER): Payer: Medicare Other | Admitting: Internal Medicine

## 2020-12-01 ENCOUNTER — Encounter: Payer: Self-pay | Admitting: Internal Medicine

## 2020-12-01 VITALS — BP 122/80 | HR 74 | Temp 98.3°F | Ht 66.5 in | Wt 220.2 lb

## 2020-12-01 DIAGNOSIS — R350 Frequency of micturition: Secondary | ICD-10-CM | POA: Diagnosis not present

## 2020-12-01 DIAGNOSIS — M25511 Pain in right shoulder: Secondary | ICD-10-CM

## 2020-12-01 DIAGNOSIS — R3589 Other polyuria: Secondary | ICD-10-CM

## 2020-12-01 LAB — URINALYSIS, ROUTINE W REFLEX MICROSCOPIC
Bilirubin, UA: NEGATIVE
Glucose, UA: NEGATIVE
Ketones, UA: NEGATIVE
Nitrite, UA: NEGATIVE
Protein,UA: NEGATIVE
Specific Gravity, UA: 1.01 (ref 1.005–1.030)
Urobilinogen, Ur: 0.2 mg/dL (ref 0.2–1.0)
pH, UA: 6.5 (ref 5.0–7.5)

## 2020-12-01 LAB — MICROSCOPIC EXAMINATION

## 2020-12-01 MED ORDER — CIPROFLOXACIN HCL 500 MG PO TABS: 500.0000 mg | ORAL_TABLET | Freq: Two times a day (BID) | ORAL | 0 refills | Status: AC

## 2020-12-01 NOTE — Progress Notes (Signed)
BP 122/80   Pulse 74   Temp 98.3 F (36.8 C) (Oral)   Ht 5' 6.5" (1.689 m)   Wt 220 lb 3.2 oz (99.9 kg)   SpO2 98%   BMI 35.01 kg/m    Subjective:    Patient ID: Joshua [name], male    DOB: December 30, 1950, 70 y.o.   MRN: [id number]  Chief Complaint  Patient presents with  . Urinary Frequency  . Chills    HPI: Joshua [name] is a 70 y.o. male  Urinary Frequency  This is a chronic problem. The problem occurs every urination. The problem has been waxing and waning. The quality of the pain is described as burning. The pain is at a severity of 5/10. The pain is mild. There has been no fever. Associated symptoms include frequency, hesitancy and urgency. Pertinent negatives include no chills, discharge, flank pain, hematuria, nausea, possible pregnancy or sweats.   Chief Complaint  Patient presents with  . Urinary Frequency  . Chills    Relevant past medical, surgical, family and social history reviewed and updated as indicated. Interim medical history since our last visit reviewed. Allergies and medications reviewed and updated.  Review of Systems  Constitutional:  Negative for chills.  Gastrointestinal:  Negative for nausea.  Genitourinary:  Positive for frequency, hesitancy and urgency. Negative for flank pain and hematuria.   Per HPI unless specifically indicated above     Objective:    BP 122/80   Pulse 74   Temp 98.3 F (36.8 C) (Oral)   Ht 5' 6.5" (1.689 m)   Wt 220 lb 3.2 oz (99.9 kg)   SpO2 98%   BMI 35.01 kg/m   Wt Readings from Last 3 Encounters:  12/01/20 220 lb 3.2 oz (99.9 kg)  11/27/20 220 lb 2 oz (99.8 kg)  11/20/20 219 lb 6.4 oz (99.5 kg)    Physical Exam Vitals and nursing note reviewed.  Constitutional:      General: He is not in acute distress.    Appearance: Normal appearance. He is not ill-appearing or diaphoretic.  HENT:     Head: Normocephalic and atraumatic.     Mouth/Throat:     Pharynx: No posterior oropharyngeal erythema.   Pulmonary:     Breath sounds: No wheezing or rhonchi.  Chest:     Chest wall: No tenderness.  Abdominal:     General: Abdomen is flat. Bowel sounds are normal. There is no distension.     Palpations: Abdomen is soft.     Tenderness: There is no abdominal tenderness. There is no right CVA tenderness, left CVA tenderness or guarding.  Musculoskeletal:        General: Tenderness present. No swelling, deformity or signs of injury.     Cervical back: No rigidity or tenderness.     Right lower leg: No edema.     Left lower leg: No edema.     Comments: Unable to move the right shoulder above 45 degrees without pain.  No weakness noted  Neurological:     Mental Status: He is alert.    Results for orders placed or performed in visit on 11/13/20  Lipid panel  Result Value Ref Range   Cholesterol, Total 180 100 - 199 mg/dL   Triglycerides 142 0 - 149 mg/dL   HDL 48 >39 mg/dL   VLDL Cholesterol Cal 25 5 - 40 mg/dL   LDL Chol Calc (NIH) 107 (H) 0 - 99 mg/dL   Chol/HDL Ratio 3.8  0.0 - 5.0 ratio  Thyroid Panel With TSH  Result Value Ref Range   TSH 2.620 0.450 - 4.500 uIU/mL   T4, Total 7.1 4.5 - 12.0 ug/dL   T3 Uptake Ratio 28 24 - 39 %   Free Thyroxine Index 2.0 1.2 - 4.9  PSA Total+%Free (Serial)  Result Value Ref Range   Prostate Specific Ag, Serum 2.4 0.0 - 4.0 ng/mL   PSA, Free 0.47 N/A ng/mL   PSA, Free Pct 19.6 %  CMP14+EGFR  Result Value Ref Range   Glucose 101 (H) 70 - 99 mg/dL   BUN 12 8 - 27 mg/dL   Creatinine, Ser 0.90 0.76 - 1.27 mg/dL   eGFR 91 >59 mL/min/1.73   BUN/Creatinine Ratio 13 10 - 24   Sodium 140 134 - 144 mmol/L   Potassium 4.3 3.5 - 5.2 mmol/L   Chloride 103 96 - 106 mmol/L   CO2 25 20 - 29 mmol/L   Calcium 9.3 8.6 - 10.2 mg/dL   Total Protein 6.6 6.0 - 8.5 g/dL   Albumin 4.2 3.7 - 4.7 g/dL   Globulin, Total 2.4 1.5 - 4.5 g/dL   Albumin/Globulin Ratio 1.8 1.2 - 2.2   Bilirubin Total 0.4 0.0 - 1.2 mg/dL   Alkaline Phosphatase 83 44 - 121 IU/L   AST  20 0 - 40 IU/L   ALT 14 0 - 44 IU/L        Current Outpatient Medications:  .  levothyroxine (SYNTHROID) 125 MCG tablet, Take 1 tablet (125 mcg total) by mouth daily., Disp: 90 tablet, Rfl: 4 .  losartan (COZAAR) 100 MG tablet, Take 1 tablet (100 mg total) by mouth daily., Disp: 90 tablet, Rfl: 3 .  methylPREDNISolone (MEDROL DOSEPAK) 4 MG TBPK tablet, Use as directed, Disp: 1 each, Rfl: 0 .  Multiple Vitamins-Minerals (EYE VITAMINS PO), Take by mouth., Disp: , Rfl:     Assessment & Plan:   UTI: startpt on cipro    pt is currently symptomatic for an Urinary tract infection(abd pain, burning etc), will cover with emperic abx, see med module for details.  encouraged to increase water/fluid intake.Signs and symptoms of emergency were discussed with the patient. The risks, benefits and side effects of treatment were discussed with the patient. The patient verbalized an understanding of plan, and was told to call the clinic/go to the ED if symptoms worsen at any point of time.  Right shoulder pain  Better , improving with medrol dose pak Will refer pt to PT, agreeable now.  Consider MRI declines it at this point.  Problem List Items Addressed This Visit   None Visit Diagnoses     Frequency of urination and polyuria    -  Primary   Relevant Orders   Urinalysis, Routine w reflex microscopic   Urine Culture        Orders Placed This Encounter  Procedures  . Urine Culture  . Urinalysis, Routine w reflex microscopic     No orders of the defined types were placed in this encounter.    Follow up plan: No follow-ups on file.

## 2020-12-01 NOTE — Telephone Encounter (Signed)
Pt called in for his shoulder x ray report.   I read the report to him.   Then he started c/o urinary symptoms.   Yesterday he called the ambulance to his house because he was freezing to death and shaking so bad.   His temperature was 99 when they checked it.   He is urinating every 15 minutes.  Denies burning but is having a little pain in the middle of his back and to the right.   It's also hard for him to get his stream started and the urine has a strong smell to it.  At first he thought he was having a reaction to the flu shot he received on Oct 20th but as I continued talking with him the urinary symptoms emerged.  No history of flu shot reactions.   I made him an appt with Dr. Neomia Dear for today at 1:00 in office visit.  He also asked if Dr. Neomia Dear thought Flexeril would help his shoulder.    I sent my notes to [facility].

## 2020-12-01 NOTE — Progress Notes (Signed)
Pl let pt know has mild DJD of the shoulder joint. If pain is worsening might need PT and MRI. Please let him know thnx.

## 2020-12-01 NOTE — Telephone Encounter (Signed)
Reason for Disposition  Urinating more frequently than usual (i.e., frequency)  Answer Assessment - Initial Assessment Questions 1. SYMPTOMS: "What is the main symptom?" (e.g., redness, swelling, pain)     Shaking so bad yesterday.    No fever.   I'm feeling hot and sweating.   Yesterday I called EMS out because I was freezing to death.  I couldn't stop shaking.   I had a flu shot on the 20th.   After 3 days after the shot I started having sinus congestion. 2. ONSET: "When was the vaccine (shot) given?" "How much later did the Nov 20, 2020 begin?" (e.g., hours, days ago)      Oct 20.     3 days after getting the shot the symptoms started.   No runny nose but I did.   3. SEVERITY: "How bad is it?"      I'm urinating every 10-15 minutes.   EMS told me yesterday I was dehydrated.   More I drink the more I pee.    I was peeing more frequently prior to the EMS coming to my house.   It's hard to get the pee started.   In the middle of my back to the right is hurting.   4. FEVER: "Is there a fever?" If Yes, ask: "What is it, how was it measured, and when did it start?"      I was having bad chills yesterday which is why I called the EMS.    I was shaking so bad. 5. IMMUNIZATIONS GIVEN: "What shots have you recently received?"     The flu shot on the 20th.   6. PAST REACTIONS: "Have you reacted to immunizations before?" If Yes, ask: "What happened?"     No   My arm might get a little sore 7. OTHER SYMPTOMS: "Do you have any other symptoms?"     Having urinary problems  urinating more frequently.   No chills today.   No burning but it's hard to get it started.   I'm peeing a lot every 15 minutes.  Answer Assessment - Initial Assessment Questions 1. SYMPTOM: "What's the main symptom you're concerned about?" (e.g., frequency, incontinence)     Peeing every 15 minutes 2. ONSET: "When did the  frequency  start?"     Saturday 3. PAIN: "Is there any pain?" If Yes, ask: "How bad is it?" (Scale: 1-10; mild,  moderate, severe)     No burning.   It's hard to get the stream started. 4. CAUSE: "What do you think is causing the symptoms?"     I've had bladder infections before.   I could have one now.   I've had to have my urethra stretched open before. 5. OTHER SYMPTOMS: "Do you have any other symptoms?" (e.g., fever, flank pain, blood in urine, pain with urination)     Had bad chills yesterday but not today.  No blood in urine but it is smelling strong. 6. PREGNANCY: "Is there any chance you are pregnant?" "When was your last menstrual period?"     N/A  Protocols used: Immunization Reactions-A-AH, Urinary Symptoms-A-AH

## 2020-12-01 NOTE — Patient Instructions (Signed)
Urinary Tract Infection, Adult °A urinary tract infection (UTI) is an infection of any part of the urinary tract. The urinary tract includes: °The kidneys. °The ureters. °The bladder. °The urethra. °These organs make, store, and get rid of pee (urine) in the body. °What are the causes? °This infection is caused by germs (bacteria) in your genital area. These germs grow and cause swelling (inflammation) of your urinary tract. °What increases the risk? °The following factors may make you more likely to develop this condition: °Using a small, thin tube (catheter) to drain pee. °Not being able to control when you pee or poop (incontinence). °Being male. If you are male, these things can increase the risk: °Using these methods to prevent pregnancy: °A medicine that kills sperm (spermicide). °A device that blocks sperm (diaphragm). °Having low levels of a male hormone (estrogen). °Being pregnant. °You are more likely to develop this condition if: °You have genes that add to your risk. °You are sexually active. °You take antibiotic medicines. °You have trouble peeing because of: °A prostate that is bigger than normal, if you are male. °A blockage in the part of your body that drains pee from the bladder. °A kidney stone. °A nerve condition that affects your bladder. °Not getting enough to drink. °Not peeing often enough. °You have other conditions, such as: °Diabetes. °A weak disease-fighting system (immune system). °Sickle cell disease. °Gout. °Injury of the spine. °What are the signs or symptoms? °Symptoms of this condition include: °Needing to pee right away. °Peeing small amounts often. °Pain or burning when peeing. °Blood in the pee. °Pee that smells bad or not like normal. °Trouble peeing. °Pee that is cloudy. °Fluid coming from the vagina, if you are male. °Pain in the belly or lower back. °Other symptoms include: °Vomiting. °Not feeling hungry. °Feeling mixed up (confused). This may be the first symptom in  older adults. °Being tired and grouchy (irritable). °A fever. °Watery poop (diarrhea). °How is this treated? °Taking antibiotic medicine. °Taking other medicines. °Drinking enough water. °In some cases, you may need to see a specialist. °Follow these instructions at home: °Medicines °Take over-the-counter and prescription medicines only as told by your doctor. °If you were prescribed an antibiotic medicine, take it as told by your doctor. Do not stop taking it even if you start to feel better. °General instructions °Make sure you: °Pee until your bladder is empty. °Do not hold pee for a long time. °Empty your bladder after sex. °Wipe from front to back after peeing or pooping if you are a male. Use each tissue one time when you wipe. °Drink enough fluid to keep your pee pale yellow. °Keep all follow-up visits. °Contact a doctor if: °You do not get better after 1-2 days. °Your symptoms go away and then come back. °Get help right away if: °You have very bad back pain. °You have very bad pain in your lower belly. °You have a fever. °You have chills. °You feeling like you will vomit or you vomit. °Summary °A urinary tract infection (UTI) is an infection of any part of the urinary tract. °This condition is caused by germs in your genital area. °There are many risk factors for a UTI. °Treatment includes antibiotic medicines. °Drink enough fluid to keep your pee pale yellow. °This information is not intended to replace advice given to you by your health care provider. Make sure you discuss any questions you have with your health care provider. °Document Revised: 08/31/2019 Document Reviewed: 08/31/2019 °Elsevier Patient Education © 2022   [employer] ° °

## 2020-12-02 DIAGNOSIS — M25511 Pain in right shoulder: Secondary | ICD-10-CM | POA: Insufficient documentation

## 2020-12-02 DIAGNOSIS — R3589 Other polyuria: Secondary | ICD-10-CM | POA: Insufficient documentation

## 2020-12-02 DIAGNOSIS — R350 Frequency of micturition: Secondary | ICD-10-CM | POA: Insufficient documentation

## 2020-12-07 LAB — URINE CULTURE

## 2020-12-15 ENCOUNTER — Ambulatory Visit: Payer: Medicare Other | Attending: Internal Medicine | Admitting: Physical Therapy

## 2020-12-15 ENCOUNTER — Other Ambulatory Visit: Payer: Self-pay

## 2020-12-15 VITALS — BP 137/86 | HR 66

## 2020-12-15 DIAGNOSIS — G8929 Other chronic pain: Secondary | ICD-10-CM | POA: Diagnosis not present

## 2020-12-15 DIAGNOSIS — R3589 Other polyuria: Secondary | ICD-10-CM | POA: Insufficient documentation

## 2020-12-15 DIAGNOSIS — R350 Frequency of micturition: Secondary | ICD-10-CM | POA: Insufficient documentation

## 2020-12-15 DIAGNOSIS — M25512 Pain in left shoulder: Secondary | ICD-10-CM | POA: Diagnosis not present

## 2020-12-15 DIAGNOSIS — M25511 Pain in right shoulder: Secondary | ICD-10-CM | POA: Diagnosis not present

## 2020-12-15 NOTE — Therapy (Signed)
[facility] [address] [place], [address] Phone: [phone]   Fax:  [phone]  Physical Therapy Evaluation  Patient Details  Name: Joshua [name] MRN: [id number] Date of Birth: 1950/12/19 Referring Provider (PT): Charlynne Cousins MD   Encounter Date: 12/15/2020   Past Medical History:  Diagnosis Date   Allergy    Amnesia 06/13/2014   B12 deficiency 06/13/2014   Benign essential HTN 10/30/2014   BPH (benign prostatic hyperplasia)    BPH with obstruction/lower urinary tract symptoms 05/15/2015   History of kidney stones    Hypogonadism in male    Hypothyroidism 10/30/2014    Past Surgical History:  Procedure Laterality Date   COLONOSCOPY  March 2016   COLONOSCOPY WITH PROPOFOL N/A 05/29/2020   Procedure: COLONOSCOPY WITH PROPOFOL;  Surgeon: Jonathon Bellows, MD;  Location: [facility] ENDOSCOPY;  Service: Gastroenterology;  Laterality: N/A;   ESOPHAGOGASTRODUODENOSCOPY ENDOSCOPY  March 2016   EYE SURGERY Bilateral    cataracts   KNEE SURGERY Right    SHOULDER SURGERY Left    TONSILLECTOMY      Vitals:   12/15/20 1118  BP: 137/86  Pulse: 66  SpO2: 99%      Subjective Assessment - 12/15/20 1118     Subjective 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 He also mentions h/o of fracturing his shoulder ten years ago after falling from ladder, but he cannot remember which shoulder. Pt also reports that there are times were room was spinning and he checked it and it was ok. Pt describes having a bad memory and briefly mentions having a left rotator cuff surgery but cannot remember when.    Pertinent History Unable to move the right shoulder above 45 degrees without pain.  No weakness noted from [name] MD    Limitations Lifting;House hold activities    How long can you sit comfortably? N/a    How long can you stand  comfortably? N/a    How long can you walk comfortably? N/a    Diagnostic tests CLINICAL DATA:  Right shoulder pain chronically.     EXAM:  RIGHT SHOULDER - 2+ VIEW     COMPARISON:  None.     FINDINGS:  Mild degenerative changes over the [facility] joint and glenohumeral joints.  No acute fracture or dislocation. No focal bony abnormality.     IMPRESSION:  1. No acute findings.  2. Mild degenerative changes.        Electronically Signed    By: Marin Olp M.D.    On: 11/29/2020 08:23    Patient Stated Goals Pt wants to improve right shoulder mobility and decrease pain with use.    Currently in Pain? Yes    Pain Score 3     Pain Location Shoulder    Pain Orientation Right    Pain Descriptors / Indicators Aching    Pain Type Chronic pain    Pain Radiating Towards No    Pain Onset More than a month ago    Pain Frequency Intermittent    Aggravating Factors  Raising shoulder overhead and laying on shoulder    Pain Relieving Factors Uses icey hot and takes tylenol    Effect of Pain on Daily Activities Able to perform tasks, but they are painful  SHOULDER EVALUATION   Cervical Triage    5 D's And 3 N's: denies diplopia, drop attacks, dysarthria, dysphagia, ataxia of  gait, and nystagmus; denies lightheadedness, numbness and occasional nausea   Cervical Radiculopathy Test cluster: Negative to all    -rotation AROM <60 deg towards symptomatic side?:   -Spurling's test:   -response to manual traction:   -upper limb tension test A:       AROM (in standing):      R     L     Normal     Shoulder flexion:      120*   120*    180            extension:          60     60      60            abduction:          120*   120*    180   ER (combined)         Top of Head   Normal Occiput   IR (combined):            PSIS*   PSIS*     T12          PROM (in supine):        R     L       Normal      Shoulder flexion:     180   180       180             Extension         60     60          60            abduction:         180   180     180                   ER:              90       90      90                   IR:                70       70       70      Strength:                            R         L      Shoulder flexion:          4/5          4/5             abduction:              3+/5         3+/5        IR (0 deg abd):           5/5          5/5           ER (0 deg abd):         4/5  5/5      Elbow  Flexion             5/5         5/5                  Extension         4/5*          5/5       Wrist Flexion               5/5          5/5                Extension          5/5          5/5  ---------------------------------------  Special Tests:                      SHOULDER                      R       L    Screen: (R UE)   -IRRST: ER=IR both painful   IR>ER - RC tendinopathy/impingement and RCT IR<ER - Anterior instability, posterior instability, bankart lesion, SLAP, and posterior impingement  IR=ER - LHB tendinopathy, AC joint pathology,    Impingement:    -Hawkins-Kennedy: +    -pain with infraspinatus MMT: + R     -Neer: +    -painful arc: + R at 60 deg through 120    Rotator cuff tear:    -full can: + R     -lift off: - B     -drop arm: - B     -belly press: - B   Biceps Tendinopathy    - Speed's Test + R    - Biceps tendon palpation:  ------------------------------------ Palpation/Accessory: Anterior portion of shoulder, pain on insertion of triceps.    THEREX:  AAROM Shoulder Abd/Add Pulleys 3 X 10  AAROM Shoulder Flex/Ext Pulleys 3 x 10  Upper Trap Stretch 2 x 30 sec           PT Education - 12/15/20 1132     Education Details form and technique for appropriate exercise and explanation of condition.    Person(s) Educated Patient    Methods Explanation;Demonstration;Verbal cues;Handout    Comprehension Verbalized understanding;Returned demonstration;Verbal cues required              PT Short Term Goals -  12/16/20 1105       PT SHORT TERM GOAL #1   Title Patient will demonstrate understanding of home exercise plan to perform independently for self-treatment of condition.    Baseline 11/14: NT    Time 2    Period Weeks    Status New    Target Date 12/30/20               PT Long Term Goals - 12/16/20 1106       PT LONG TERM GOAL #1   Title Patient will have improved function and activity level as evidenced by an increase in FOTO score by 10 points or more.    Baseline 11/14: 50/64    Time 8    Period Weeks    Status New    Target Date 02/09/21      PT LONG TERM GOAL #2   Title Patient will improve shoulder AROM to >=130 degrees of flexion  and abduction to perform overhead tasks to care for his wife.    Baseline 11/14: Bilateral Shoulder Abduction and Flexion 130    Time 8    Period Weeks    Status New    Target Date 02/09/21      PT LONG TERM GOAL #3   Title Patient will improve right shoulder strength by >=1/2 MMT to better perform UE activities such as helping to move in bed.    Baseline 11/14: R Shoulder Flex 4/5, abd 3+/5, ER at 0 deg 4/5    Time 8    Period Weeks    Status New    Target Date 02/09/21                    Plan - 12/16/20 1116     Clinical Impression Statement Pt is a 70 yo white male that presents with right and left shoulder pain with insidious onset that occured 4 years ago. He exhibits signs and symptoms of subacromial impingement, biceps and rotator cuff tendinitopy with decreased shoulder AROM, pain with overhead and shoulder flexion movements, and decreased shoulder strength. Pt will benefit from skilled PT to improve shoulder ROM and strength and to decrease shoulder pain to continue to be able to provide care for his wife.    Personal Factors and Comorbidities Age;Time since onset of injury/illness/exacerbation    Examination-Activity Limitations Reach Overhead;Carry;Lift;Caring for Others    Examination-Participation Restrictions  Cleaning;Meal Prep    Stability/Clinical Decision Making Stable/Uncomplicated    Clinical Decision Making Low    Rehab Potential Fair   RTC injury appears to be closer to tendinosis with severity of ROM and strength limitation   PT Frequency 2x / week    PT Duration 8 weeks    PT Treatment/Interventions Moist Heat;Aquatic Therapy;Dry needling;Passive range of motion;Therapeutic exercise;Therapeutic activities;Joint Manipulations;Patient/family education;Splinting;Electrical Stimulation;Manual techniques    PT Next Visit Plan Continue with shoulder ROM exercises and begin periscapular strengthening exercises    PT Home Exercise Plan [email or web address]    Consulted and Agree with Plan of Care Patient            HEP includes the following:  Access Code: [email or web address] URL: [email or web address] Date: 12/15/2020 Prepared by: Bradly Chris  Exercises Seated Shoulder Flexion AAROM with Pulley Behind - 1 x daily - 7 x weekly - 3 sets - 10 reps Seated Shoulder Abduction AAROM with Pulley Behind - 1 x daily - 7 x weekly - 3 sets - 10 reps Seated Gentle Upper Trapezius Stretch - 1 x daily - 7 x weekly - 1 sets - 3 reps - 60 hold   Patient will benefit from skilled therapeutic intervention in order to improve the following deficits and impairments:  Pain, Impaired UE functional use, Decreased range of motion, Decreased strength, Obesity  Visit Diagnosis: Chronic right shoulder pain  Left shoulder pain, unspecified chronicity     Problem List Patient Active Problem List   Diagnosis Date Noted   Frequency of urination and polyuria 12/02/2020   Right shoulder pain 12/02/2020   Need for influenza vaccination 11/20/2020   Bilateral shoulder pain 11/20/2020   OSA on CPAP 08/18/2020   CPAP use counseling 08/18/2020   Hypertension 08/18/2020   Ear pain 01/10/2020   Obesity (BMI 30-39.9) 01/08/2020   Headache 01/08/2020   Prediabetes 10/12/2019   Cyst of soft tissue 10/11/2019    Urinary frequency 10/11/2019   Small vessel disease ([place]) 10/25/2017   Chest pain 08/24/2017   Pernicious anemia  06/10/2016   Advanced care planning/counseling discussion 06/10/2016   Screening for prostate cancer 06/10/2015   Nephrolithiasis 06/10/2015   Erectile dysfunction 06/10/2015   BPH with obstruction/lower urinary tract symptoms 05/15/2015   Benign essential HTN 10/30/2014   Hypothyroidism 10/30/2014   B12 deficiency 06/13/2014   Memory loss 06/13/2014   Bradly Chris PT, DPT  12/16/2020, 11:36 AM  [place] [address] [place], [address] Phone: [phone]   Fax:  [phone]  Name: STOY FENN MRN: [id number] Date of Birth: 08-Sep-1950

## 2020-12-16 ENCOUNTER — Telehealth: Payer: Self-pay | Admitting: Physical Therapy

## 2020-12-17 ENCOUNTER — Ambulatory Visit: Payer: Medicare Other | Admitting: Physical Therapy

## 2020-12-17 ENCOUNTER — Encounter: Payer: Self-pay | Admitting: Physical Therapy

## 2020-12-17 DIAGNOSIS — R3589 Other polyuria: Secondary | ICD-10-CM | POA: Diagnosis not present

## 2020-12-17 DIAGNOSIS — M25511 Pain in right shoulder: Secondary | ICD-10-CM | POA: Diagnosis not present

## 2020-12-17 DIAGNOSIS — R350 Frequency of micturition: Secondary | ICD-10-CM | POA: Diagnosis not present

## 2020-12-17 DIAGNOSIS — G8929 Other chronic pain: Secondary | ICD-10-CM | POA: Diagnosis not present

## 2020-12-17 DIAGNOSIS — M25512 Pain in left shoulder: Secondary | ICD-10-CM

## 2020-12-17 NOTE — Therapy (Signed)
[facility] [place] [address] [place], [address] Phone: [phone]   Fax:  [phone]  Physical Therapy Treatment  Patient Details  Name: Joshua [name] MRN: [id number] Date of Birth: July 11, 1950 Referring Provider (PT): Charlynne Cousins MD   Encounter Date: 12/17/2020   PT End of Session - 12/17/20 1315     Visit Number 2    Number of Visits 16    Date for PT Re-Evaluation 02/10/21    PT Start Time 1152    PT Stop Time 7353    PT Time Calculation (min) 43 min    Activity Tolerance Patient tolerated treatment well    Behavior During Therapy [facility] [place] for tasks assessed/performed             Past Medical History:  Diagnosis Date   Allergy    Amnesia 06/13/2014   B12 deficiency 06/13/2014   Benign essential HTN 10/30/2014   BPH (benign prostatic hyperplasia)    BPH with obstruction/lower urinary tract symptoms 05/15/2015   History of kidney stones    Hypogonadism in male    Hypothyroidism 10/30/2014    Past Surgical History:  Procedure Laterality Date   COLONOSCOPY  March 2016   COLONOSCOPY WITH PROPOFOL N/A 05/29/2020   Procedure: COLONOSCOPY WITH PROPOFOL;  Surgeon: Jonathon Bellows, MD;  Location: [facility] ENDOSCOPY;  Service: Gastroenterology;  Laterality: N/A;   ESOPHAGOGASTRODUODENOSCOPY ENDOSCOPY  March 2016   EYE SURGERY Bilateral    cataracts   KNEE SURGERY Right    SHOULDER SURGERY Left    TONSILLECTOMY      There were no vitals filed for this visit.   Subjective Assessment - 12/17/20 1200     Subjective Pt states he is doing well today. Reports he is sore from HEP provided last session. Rates pain at 3/10 in right shouler and 2/10 in left shoulder. Pt compliant with HEP.    Pertinent History Unable to move the right shoulder above 45 degrees without pain.  No weakness noted from [name] MD.  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 He also mentions h/o of fracturing his shoulder ten years ago after falling from ladder, but he cannot remember which shoulder. Pt also reports that there are times were room was spinning and he checked it and it was ok. Pt describes having a bad memory and briefly mentions having a left rotator cuff surgery but cannot remember when.    Limitations Lifting;House hold activities    How long can you sit comfortably? N/a    How long can you stand comfortably? N/a    How long can you walk comfortably? N/a    Diagnostic tests CLINICAL DATA:  Right shoulder pain chronically.     EXAM:  RIGHT SHOULDER - 2+ VIEW     COMPARISON:  None.     FINDINGS:  Mild degenerative changes over the [facility] joint and glenohumeral joints.  No acute fracture or dislocation. No focal bony abnormality.     IMPRESSION:  1. No acute findings.  2. Mild degenerative changes.        Electronically Signed    By: Marin Olp M.D.    On: 11/29/2020 08:23    Patient Stated Goals Pt wants to improve right shoulder mobility and decrease pain with use.    Currently in Pain? Yes    Pain Score  3     Pain Location Shoulder    Pain Orientation Right    Pain Descriptors / Indicators Aching    Pain Type Chronic pain    Pain Onset More than a month ago               THEREX:   AAROM Shoulder Abd/Add Pulleys 3 x 10  AAROM Shoulder Flex/Ext Pulleys 3 x 10  -VC on ROM to avoid painful range    Supine AAROM Shoulder Flex/Ext Wand 2 x 10   Supine GH Flexion to 90* with gentle perturbations, 2 x 1 minute BUE  Supine Chest Press, 2# DB 2 x 10 BUE  Seated Scapular Retraction, 3 x 10  Upper Trap Stretch 2 x 30 sec BUE  Updated HEP and educated patient on addition of new exercises including scapular retraction and upper trap stretch (pt unaware UT stretch was in HEP last session).  MANUAL: STM to R anterior deltoid, coracobrachialis, and superior biceps. Palpable trigger points identified. Pt reports  hyperirritable. 8 minutes.     Clinical Impression: Pt demonstrates excellent motivation throughout today's session. PT implemented additional AAROM exercises for bilateral shoulders as well as gentle strengthening and stabilization. Upon palpation, PT noted palpable and hyperirritable trigger points within anterior shoulder musculature. Some relief after STM however trigger points remain. Both [place] joints present with decreased strength, stability and ROM however right more limited than left. Pt will benefit from skilled PT to improve shoulder ROM and strength and to decrease shoulder pain to continue to be able to provide care for his wife.          PT Short Term Goals - 12/16/20 1105       PT SHORT TERM GOAL #1   Title Patient will demonstrate understanding of home exercise plan to perform independently for self-treatment of condition.    Baseline 11/14: NT    Time 2    Period Weeks    Status New    Target Date 12/30/20               PT Long Term Goals - 12/16/20 1106       PT LONG TERM GOAL #1   Title Patient will have improved function and activity level as evidenced by an increase in FOTO score by 10 points or more.    Baseline 11/14: 50/64    Time 8    Period Weeks    Status New    Target Date 02/09/21      PT LONG TERM GOAL #2   Title Patient will improve shoulder AROM to >=130 degrees of flexion and abduction to perform overhead tasks to care for his wife.    Baseline 11/14: Bilateral Shoulder Abduction and Flexion 130    Time 8    Period Weeks    Status New    Target Date 02/09/21      PT LONG TERM GOAL #3   Title Patient will improve right shoulder strength by >=1/2 MMT to better perform UE activities such as helping to move in bed.    Baseline 11/14: R Shoulder Flex 4/5, abd 3+/5, ER at 0 deg 4/5    Time 8    Period Weeks    Status New    Target Date 02/09/21                   Plan - 12/17/20 1313     Clinical Impression Statement Pt  demonstrates excellent motivation throughout today's  session. PT implemented additional AAROM exercises for bilateral shoulders as well as gentle strengthening and stabilization. Upon palpation, PT noted palpable and hyperirritable trigger points within anterior shoulder musculature. Some relief after STM however trigger points remain. Both [place] joints present with decreased strength, stability and ROM however right more limited than left. Pt will benefit from skilled PT to improve shoulder ROM and strength and to decrease shoulder pain to continue to be able to provide care for his wife.    Personal Factors and Comorbidities Age;Time since onset of injury/illness/exacerbation    Examination-Activity Limitations Reach Overhead;Carry;Lift;Caring for Others    Examination-Participation Restrictions Cleaning;Meal Prep    Stability/Clinical Decision Making Stable/Uncomplicated    Rehab Potential Fair   RTC injury appears to be closer to tendinosis with severity of ROM and strength limitation   PT Frequency 2x / week    PT Duration 8 weeks    PT Treatment/Interventions Moist Heat;Aquatic Therapy;Dry needling;Passive range of motion;Therapeutic exercise;Therapeutic activities;Joint Manipulations;Patient/family education;Splinting;Electrical Stimulation;Manual techniques    PT Next Visit Plan Continue with shoulder ROM exercises and begin periscapular strengthening exercises    PT Home Exercise Plan [email or web address]    Consulted and Agree with Plan of Care Patient             Patient will benefit from skilled therapeutic intervention in order to improve the following deficits and impairments:  Pain, Impaired UE functional use, Decreased range of motion, Decreased strength, Obesity  Visit Diagnosis: Chronic right shoulder pain  Left shoulder pain, unspecified chronicity     Problem List Patient Active Problem List   Diagnosis Date Noted   Frequency of urination and polyuria 12/02/2020   Right shoulder  pain 12/02/2020   Need for influenza vaccination 11/20/2020   Bilateral shoulder pain 11/20/2020   OSA on CPAP 08/18/2020   CPAP use counseling 08/18/2020   Hypertension 08/18/2020   Ear pain 01/10/2020   Obesity (BMI 30-39.9) 01/08/2020   Headache 01/08/2020   Prediabetes 10/12/2019   Cyst of soft tissue 10/11/2019   Urinary frequency 10/11/2019   Small vessel disease ([place]) 10/25/2017   Chest pain 08/24/2017   Pernicious anemia 06/10/2016   Advanced care planning/counseling discussion 06/10/2016   Screening for prostate cancer 06/10/2015   Nephrolithiasis 06/10/2015   Erectile dysfunction 06/10/2015   BPH with obstruction/lower urinary tract symptoms 05/15/2015   Benign essential HTN 10/30/2014   Hypothyroidism 10/30/2014   B12 deficiency 06/13/2014   Memory loss 06/13/2014    Patrina Levering PT, DPT  [place] [facility] [address] [place], [address] Phone: [phone]   Fax:  [phone]  Name: Joshua [name] MRN: [id number] Date of Birth: January 22, 1951

## 2020-12-22 ENCOUNTER — Ambulatory Visit: Payer: Medicare Other | Admitting: Physical Therapy

## 2020-12-22 DIAGNOSIS — R350 Frequency of micturition: Secondary | ICD-10-CM | POA: Diagnosis not present

## 2020-12-22 DIAGNOSIS — M25511 Pain in right shoulder: Secondary | ICD-10-CM

## 2020-12-22 DIAGNOSIS — G8929 Other chronic pain: Secondary | ICD-10-CM

## 2020-12-22 DIAGNOSIS — R3589 Other polyuria: Secondary | ICD-10-CM | POA: Diagnosis not present

## 2020-12-22 DIAGNOSIS — M25512 Pain in left shoulder: Secondary | ICD-10-CM | POA: Diagnosis not present

## 2020-12-22 NOTE — Therapy (Signed)
[facility] [address] [place], [address] Phone: [phone]   Fax:  [phone]  Physical Therapy Treatment  Patient Details  Name: Joshua [name] MRN: [id number] Date of Birth: 1950-07-23 Referring Provider (PT): Charlynne Cousins MD   Encounter Date: 12/22/2020   PT End of Session - 12/22/20 1104     Visit Number 3    Number of Visits 16    Date for PT Re-Evaluation 02/10/21    PT Start Time 59    PT Stop Time 6962    PT Time Calculation (min) 45 min    Activity Tolerance Patient tolerated treatment well    Behavior During Therapy [facility] for tasks assessed/performed             Past Medical History:  Diagnosis Date   Allergy    Amnesia 06/13/2014   B12 deficiency 06/13/2014   Benign essential HTN 10/30/2014   BPH (benign prostatic hyperplasia)    BPH with obstruction/lower urinary tract symptoms 05/15/2015   History of kidney stones    Hypogonadism in male    Hypothyroidism 10/30/2014    Past Surgical History:  Procedure Laterality Date   COLONOSCOPY  March 2016   COLONOSCOPY WITH PROPOFOL N/A 05/29/2020   Procedure: COLONOSCOPY WITH PROPOFOL;  Surgeon: Jonathon Bellows, MD;  Location: [facility] ENDOSCOPY;  Service: Gastroenterology;  Laterality: N/A;   ESOPHAGOGASTRODUODENOSCOPY ENDOSCOPY  March 2016   EYE SURGERY Bilateral    cataracts   KNEE SURGERY Right    SHOULDER SURGERY Left    TONSILLECTOMY      There were no vitals filed for this visit.   Subjective Assessment - 12/22/20 1102     Subjective Pt reports that he has been experiencing increased bilateral shoulder pain to the point where he is unable to do his exercises. He describes it as a dull throb.    Pertinent History Unable to move the right shoulder above 45 degrees without pain.  No weakness noted from [name] MD.  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 He also mentions h/o of fracturing his shoulder ten years ago after falling from ladder, but he cannot remember which shoulder. Pt also reports that there are times were room was spinning and he checked it and it was ok. Pt describes having a bad memory and briefly mentions having a left rotator cuff surgery but cannot remember when.    Limitations Lifting;House hold activities    How long can you sit comfortably? N/a    How long can you stand comfortably? N/a    How long can you walk comfortably? N/a    Diagnostic tests CLINICAL DATA:  Right shoulder pain chronically.     EXAM:  RIGHT SHOULDER - 2+ VIEW     COMPARISON:  None.     FINDINGS:  Mild degenerative changes over the [facility] joint and glenohumeral joints.  No acute fracture or dislocation. No focal bony abnormality.     IMPRESSION:  1. No acute findings.  2. Mild degenerative changes.        Electronically Signed    By: Marin Olp M.D.    On: 11/29/2020 08:23    Patient Stated Goals Pt wants to improve right shoulder mobility and decrease pain with use.    Currently in Pain? Yes    Pain Score 4  Pain Location Shoulder    Pain Orientation Right;Left    Pain Descriptors / Indicators Aching    Pain Type Chronic pain    Pain Onset More than a month ago             Manual Therapy   Supine Upper Trap Stretch 4 x 30 sec  Cervical Distraction 5 x 10 sec  Suboccipital Release 5 x 30 sec   THEREX:   Supine Shoulder Abduction AAROM Wand 2 x 10  Supine Shoulder Flexion AAROM Wand Standing Single Arm Pec Stretch 2 x 30 sec  Seated Rows with RTB 3 x 10      Updated HEP and educated patient on changes to exercises and addition of new exercises to include ROM wands, seated rows, and standing pec stretch. Removed shoulder wall walks due to patient's pain.                   PT Education - 12/22/20 1103     Education Details form and technique for appropriate exercise    Person(s) Educated Patient     Methods Explanation;Demonstration;Verbal cues;Handout    Comprehension Verbalized understanding;Returned demonstration;Verbal cues required              PT Short Term Goals - 12/22/20 1105       PT SHORT TERM GOAL #1   Title Patient will demonstrate understanding of home exercise plan to perform independently for self-treatment of condition.    Baseline 11/14: NT    Time 2    Period Weeks    Status New    Target Date 12/30/20               PT Long Term Goals - 12/22/20 1106       PT LONG TERM GOAL #1   Title Patient will have improved function and activity level as evidenced by an increase in FOTO score by 10 points or more.    Baseline 11/14: 50/64    Time 8    Period Weeks    Status New      PT LONG TERM GOAL #2   Title Patient will improve shoulder AROM to >=130 degrees of flexion and abduction to perform overhead tasks to care for his wife.    Baseline 11/14: Bilateral Shoulder Abduction and Flexion 130    Time 8    Period Weeks    Status New      PT LONG TERM GOAL #3   Title Patient will improve right shoulder strength by >=1/2 MMT to better perform UE activities such as helping to move in bed.    Baseline 11/14: R Shoulder Flex 4/5, abd 3+/5, ER at 0 deg 4/5    Time 8    Period Weeks    Status New                   Plan - 12/22/20 1105     Clinical Impression Statement Pt presents for f/u for bilateral shoulder pain 2/2 potential impingment and RTC pathology. Pt demonstrates some improvement with pain tolerance during today's session with ability to perform seated rows with increased resistance. Pt reports relief after manual therapy to decrease tension in neck and cehst musculature.  He will continue to benefit from skilled PT to improve shoulder ROM and strength and to decrease shoulder pain to continue to be able to provide care for his wife.    Personal Factors and Comorbidities Age;Time since onset of injury/illness/exacerbation  Examination-Activity Limitations Reach Overhead;Carry;Lift;Caring for Others    Examination-Participation Restrictions Cleaning;Meal Prep    Stability/Clinical Decision Making Stable/Uncomplicated    Clinical Decision Making Low    Rehab Potential Fair   RTC injury appears to be closer to tendinosis with severity of ROM and strength limitation   PT Frequency 2x / week    PT Duration 8 weeks    PT Treatment/Interventions Moist Heat;Aquatic Therapy;Dry needling;Passive range of motion;Therapeutic exercise;Therapeutic activities;Joint Manipulations;Patient/family education;Splinting;Electrical Stimulation;Manual techniques    PT Next Visit Plan Educate pt on lifting techniques. Continue with shoulder ROM exercises and begin periscapular strengthening exercises    PT Home Exercise Plan [email or web address]    Consulted and Agree with Plan of Care Patient            HEP includes the following:  Access Code: [email or web address] URL: [email or web address] Date: 12/22/2020 Prepared by: Bradly Chris  Exercises Seated Gentle Upper Trapezius Stretch - 1 x daily - 7 x weekly - 1 sets - 3 reps - 60 hold Standing Pec Stretch at [place] - 1 x daily - 7 x weekly - 1 sets - 5 reps - 30 hold Seated Shoulder Flexion AAROM with Pulley Behind - 1 x daily - 7 x weekly - 3 sets - 10 reps Seated Shoulder Abduction AAROM with Pulley Behind - 1 x daily - 7 x weekly - 3 sets - 10 reps Supine Shoulder Abduction AAROM with Dowel - 1 x daily - 7 x weekly - 3 sets - 10 reps Supine Shoulder Flexion Extension AAROM with Dowel - 1 x daily - 7 x weekly - 3 sets - 10 reps Standing Shoulder Row with Anchored Resistance - 1 x daily - 3 x weekly - 3 sets - 10 reps    Patient will benefit from skilled therapeutic intervention in order to improve the following deficits and impairments:  Pain, Impaired UE functional use, Decreased range of motion, Decreased strength, Obesity  Visit Diagnosis: Chronic right shoulder pain  Left  shoulder pain, unspecified chronicity     Problem List Patient Active Problem List   Diagnosis Date Noted   Frequency of urination and polyuria 12/02/2020   Right shoulder pain 12/02/2020   Need for influenza vaccination 11/20/2020   Bilateral shoulder pain 11/20/2020   OSA on CPAP 08/18/2020   CPAP use counseling 08/18/2020   Hypertension 08/18/2020   Ear pain 01/10/2020   Obesity (BMI 30-39.9) 01/08/2020   Headache 01/08/2020   Prediabetes 10/12/2019   Cyst of soft tissue 10/11/2019   Urinary frequency 10/11/2019   Small vessel disease ([place]) 10/25/2017   Chest pain 08/24/2017   Pernicious anemia 06/10/2016   Advanced care planning/counseling discussion 06/10/2016   Screening for prostate cancer 06/10/2015   Nephrolithiasis 06/10/2015   Erectile dysfunction 06/10/2015   BPH with obstruction/lower urinary tract symptoms 05/15/2015   Benign essential HTN 10/30/2014   Hypothyroidism 10/30/2014   B12 deficiency 06/13/2014   Memory loss 06/13/2014   Bradly Chris PT, DPT  12/22/2020, 1:01 PM  [facility] [facility] [address] [place], [address] Phone: [phone]   Fax:  [phone]  Name: Joshua [name] MRN: [id number] Date of Birth: 1950/10/07

## 2020-12-24 ENCOUNTER — Encounter: Payer: Self-pay | Admitting: Physical Therapy

## 2020-12-24 ENCOUNTER — Ambulatory Visit: Payer: Medicare Other | Admitting: Physical Therapy

## 2020-12-24 DIAGNOSIS — G8929 Other chronic pain: Secondary | ICD-10-CM

## 2020-12-24 DIAGNOSIS — M25512 Pain in left shoulder: Secondary | ICD-10-CM

## 2020-12-24 DIAGNOSIS — M25511 Pain in right shoulder: Secondary | ICD-10-CM

## 2020-12-24 DIAGNOSIS — R3589 Other polyuria: Secondary | ICD-10-CM | POA: Diagnosis not present

## 2020-12-24 DIAGNOSIS — R350 Frequency of micturition: Secondary | ICD-10-CM | POA: Diagnosis not present

## 2020-12-24 NOTE — Therapy (Signed)
[facility] [address], [place], [address] Phone: [phone]   Fax:  [phone]  Physical Therapy Treatment  Patient Details  Name: Joshua [name] MRN: [id number] Date of Birth: 1950/08/02 Referring Provider (PT): Charlynne Cousins MD   Encounter Date: 12/24/2020   PT End of Session - 12/24/20 0944     Visit Number 4    Number of Visits 16    Date for PT Re-Evaluation 02/10/21    PT Start Time 0930    PT Stop Time 7915    PT Time Calculation (min) 45 min    Activity Tolerance Patient tolerated treatment well    Behavior During Therapy [facility] for tasks assessed/performed             Past Medical History:  Diagnosis Date   Allergy    Amnesia 06/13/2014   B12 deficiency 06/13/2014   Benign essential HTN 10/30/2014   BPH (benign prostatic hyperplasia)    BPH with obstruction/lower urinary tract symptoms 05/15/2015   History of kidney stones    Hypogonadism in male    Hypothyroidism 10/30/2014    Past Surgical History:  Procedure Laterality Date   COLONOSCOPY  March 2016   COLONOSCOPY WITH PROPOFOL N/A 05/29/2020   Procedure: COLONOSCOPY WITH PROPOFOL;  Surgeon: Jonathon Bellows, MD;  Location: [facility] ENDOSCOPY;  Service: Gastroenterology;  Laterality: N/A;   ESOPHAGOGASTRODUODENOSCOPY ENDOSCOPY  March 2016   EYE SURGERY Bilateral    cataracts   KNEE SURGERY Right    SHOULDER SURGERY Left    TONSILLECTOMY      There were no vitals filed for this visit.   Subjective Assessment - 12/24/20 0942     Subjective Pt reports some stiffiness from sitting down hunting yesterday.    Pertinent History Unable to move the right shoulder above 45 degrees without pain.  No weakness noted from [name] MD.  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 He also mentions h/o of fracturing his shoulder ten years ago after falling  from ladder, but he cannot remember which shoulder. Pt also reports that there are times were room was spinning and he checked it and it was ok. Pt describes having a bad memory and briefly mentions having a left rotator cuff surgery but cannot remember when.    Limitations Lifting;House hold activities    How long can you sit comfortably? N/a    How long can you stand comfortably? N/a    How long can you walk comfortably? N/a    Diagnostic tests CLINICAL DATA:  Right shoulder pain chronically.     EXAM:  RIGHT SHOULDER - 2+ VIEW     COMPARISON:  None.     FINDINGS:  Mild degenerative changes over the [facility] joint and glenohumeral joints.  No acute fracture or dislocation. No focal bony abnormality.     IMPRESSION:  1. No acute findings.  2. Mild degenerative changes.        Electronically Signed    By: Marin Olp M.D.    On: 11/29/2020 08:23    Patient Stated Goals Pt wants to improve right shoulder mobility and decrease pain with use.    Currently in Pain? Yes    Pain Score 4     Pain Location Shoulder    Pain Orientation Right;Left    Pain Onset More than a month  ago             MANUAL THERAPY:  Seated Pec Stretch 5 x 30 sec  -Knee placed in the center of patient's back for leverage   Upper Trap Stretch in sitting 5 x 30 sec   THEREX:   AAROM Shoulder Pulleys Abduction/Adduction 3 x 10  AAROM Shoulder Pulleys Flexion/Extension 3 x 10  Supine Pec Stretch 5 x 60 sec  -Half of foam roll placed under patient  Supine Chin Tucks 3 x 10    Updated HEP and educated patient on changes to exercises and addition of new exercises including supine pec stretch and seated chin tucks.                            PT Education - 12/24/20 0943     Education Details form and technique for appropriate exercise    Person(s) Educated Patient    Methods Explanation;Demonstration;Verbal cues;Handout    Comprehension Verbalized understanding;Returned demonstration;Verbal cues  required;Tactile cues required              PT Short Term Goals - 12/24/20 1012       PT SHORT TERM GOAL #1   Title Patient will demonstrate understanding of home exercise plan to perform independently for self-treatment of condition.               PT Long Term Goals - 12/24/20 1002       PT LONG TERM GOAL #1   Title Patient will have improved function and activity level as evidenced by an increase in FOTO score by 10 points or more.    Baseline 11/14: 50/64    Time 8    Period Weeks    Status On-going      PT LONG TERM GOAL #2   Title Patient will improve shoulder AROM to >=130 degrees of flexion and abduction to perform overhead tasks to care for his wife.    Baseline 11/14: Bilateral Shoulder Abduction and Flexion 130    Time 8    Period Weeks    Status On-going      PT LONG TERM GOAL #3   Title Patient will improve right shoulder strength by >=1/2 MMT to better perform UE activities such as helping to move in bed.    Baseline 11/14: R Shoulder Flex 4/5, abd 3+/5, ER at 0 deg 4/5    Time 8    Period Weeks    Status On-going                   Plan - 12/24/20 0946     Clinical Impression Statement Pt presents for f/u for bilateral shoulder pain likely 2/2 potential impingment and RTC pathology. He continues to demonstrate increased anterior chest and neck muscular tightness due to postural abnormalities. Pt benefits from stretches to reduce muscular tightness and to correct abnormal shoulder motion with excessive activation of upper trapezius. Pt will continue to benefit from skilled PT to increase bilateral shoulder ROM and decrease shoulder pain to continue to care for wife and hunt recreationally.    Personal Factors and Comorbidities Age;Time since onset of injury/illness/exacerbation    Examination-Activity Limitations Reach Overhead;Carry;Lift;Caring for Others    Examination-Participation Restrictions Cleaning;Meal Prep    Stability/Clinical  Decision Making Stable/Uncomplicated    Rehab Potential Fair   RTC injury appears to be closer to tendinosis with severity of ROM and strength limitation   PT Frequency  2x / week    PT Duration 8 weeks    PT Treatment/Interventions Moist Heat;Aquatic Therapy;Dry needling;Passive range of motion;Therapeutic exercise;Therapeutic activities;Joint Manipulations;Patient/family education;Splinting;Electrical Stimulation;Manual techniques    PT Next Visit Plan Lifting techniques and periscapular strengthening    PT Home Exercise Plan [email or web address]    Consulted and Agree with Plan of Care Patient            HEP includes the following:   Access Code: [email or web address] URL: [email or web address] Date: 12/24/2020 Prepared by: Bradly Chris  Exercises Seated Gentle Upper Trapezius Stretch - 1 x daily - 7 x weekly - 1 sets - 3 reps - 60 hold Supine Chest Stretch on Foam Roll - 1 x daily - 7 x weekly - 1 sets - 3 reps - 60 hold Seated Cervical Retraction - 1 x daily - 7 x weekly - 3 sets - 10 reps Standing Pec Stretch at Wall - 1 x daily - 7 x weekly - 1 sets - 5 reps - 30 hold Seated Shoulder Flexion AAROM with Pulley Behind - 1 x daily - 7 x weekly - 3 sets - 10 reps Seated Shoulder Abduction AAROM with Pulley Behind - 1 x daily - 7 x weekly - 3 sets - 10 reps Supine Shoulder Abduction AAROM with Dowel - 1 x daily - 7 x weekly - 3 sets - 10 reps Supine Shoulder Flexion Extension AAROM with Dowel - 1 x daily - 7 x weekly - 3 sets - 10 reps Standing Shoulder Row with Anchored Resistance - 1 x daily - 3 x weekly - 3 sets - 10 reps    Patient will benefit from skilled therapeutic intervention in order to improve the following deficits and impairments:  Pain, Impaired UE functional use, Decreased range of motion, Decreased strength, Obesity  Visit Diagnosis: Chronic right shoulder pain  Left shoulder pain, unspecified chronicity     Problem List Patient Active Problem List   Diagnosis  Date Noted   Frequency of urination and polyuria 12/02/2020   Right shoulder pain 12/02/2020   Need for influenza vaccination 11/20/2020   Bilateral shoulder pain 11/20/2020   OSA on CPAP 08/18/2020   CPAP use counseling 08/18/2020   Hypertension 08/18/2020   Ear pain 01/10/2020   Obesity (BMI 30-39.9) 01/08/2020   Headache 01/08/2020   Prediabetes 10/12/2019   Cyst of soft tissue 10/11/2019   Urinary frequency 10/11/2019   Small vessel disease ([place]) 10/25/2017   Chest pain 08/24/2017   Pernicious anemia 06/10/2016   Advanced care planning/counseling discussion 06/10/2016   Screening for prostate cancer 06/10/2015   Nephrolithiasis 06/10/2015   Erectile dysfunction 06/10/2015   BPH with obstruction/lower urinary tract symptoms 05/15/2015   Benign essential HTN 10/30/2014   Hypothyroidism 10/30/2014   B12 deficiency 06/13/2014   Memory loss 06/13/2014   Bradly Chris PT, DPT  12/24/2020, 10:16 AM  [place] [facility] [address], [place], [address] Phone: [phone]   Fax:  [phone]  Name: Joshua [name] MRN: [id number] Date of Birth: 06-16-1950

## 2020-12-29 ENCOUNTER — Ambulatory Visit: Payer: Medicare Other | Admitting: Physical Therapy

## 2020-12-29 ENCOUNTER — Ambulatory Visit: Payer: Medicare Other | Admitting: Internal Medicine

## 2020-12-29 DIAGNOSIS — M25511 Pain in right shoulder: Secondary | ICD-10-CM | POA: Diagnosis not present

## 2020-12-29 DIAGNOSIS — R3589 Other polyuria: Secondary | ICD-10-CM | POA: Diagnosis not present

## 2020-12-29 DIAGNOSIS — M25512 Pain in left shoulder: Secondary | ICD-10-CM

## 2020-12-29 DIAGNOSIS — G8929 Other chronic pain: Secondary | ICD-10-CM | POA: Diagnosis not present

## 2020-12-29 DIAGNOSIS — R350 Frequency of micturition: Secondary | ICD-10-CM | POA: Diagnosis not present

## 2020-12-29 NOTE — Therapy (Signed)
[place] [facility] [address] [place], [address] Phone: [phone]   Fax:  [phone]  Physical Therapy Treatment  Patient Details  Name: Joshua [name] MRN: [id number] Date of Birth: 1950-12-05 Referring Provider (PT): Charlynne Cousins MD   Encounter Date: 12/29/2020   PT End of Session - 12/29/20 1113     Visit Number 5    Number of Visits 16    Date for PT Re-Evaluation 02/10/21    PT Start Time 1100    PT Stop Time 4481    PT Time Calculation (min) 45 min    Activity Tolerance Patient tolerated treatment well    Behavior During Therapy [facility] for tasks assessed/performed             Past Medical History:  Diagnosis Date   Allergy    Amnesia 06/13/2014   B12 deficiency 06/13/2014   Benign essential HTN 10/30/2014   BPH (benign prostatic hyperplasia)    BPH with obstruction/lower urinary tract symptoms 05/15/2015   History of kidney stones    Hypogonadism in male    Hypothyroidism 10/30/2014    Past Surgical History:  Procedure Laterality Date   COLONOSCOPY  March 2016   COLONOSCOPY WITH PROPOFOL N/A 05/29/2020   Procedure: COLONOSCOPY WITH PROPOFOL;  Surgeon: Jonathon Bellows, MD;  Location: [facility] ENDOSCOPY;  Service: Gastroenterology;  Laterality: N/A;   ESOPHAGOGASTRODUODENOSCOPY ENDOSCOPY  March 2016   EYE SURGERY Bilateral    cataracts   KNEE SURGERY Right    SHOULDER SURGERY Left    TONSILLECTOMY      There were no vitals filed for this visit.   Subjective Assessment - 12/29/20 1105     Subjective Pt reports that he was able to do most of his exercises without much difficulty.    Pertinent History Unable to move the right shoulder above 45 degrees without pain.  No weakness noted from [name] MD.  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 He also mentions h/o of fracturing his shoulder ten years  ago after falling from ladder, but he cannot remember which shoulder. Pt also reports that there are times were room was spinning and he checked it and it was ok. Pt describes having a bad memory and briefly mentions having a left rotator cuff surgery but cannot remember when.    Limitations Lifting;House hold activities    How long can you sit comfortably? N/a    How long can you stand comfortably? N/a    How long can you walk comfortably? N/a    Diagnostic tests CLINICAL DATA:  Right shoulder pain chronically.     EXAM:  RIGHT SHOULDER - 2+ VIEW     COMPARISON:  None.     FINDINGS:  Mild degenerative changes over the [facility] joint and glenohumeral joints.  No acute fracture or dislocation. No focal bony abnormality.     IMPRESSION:  1. No acute findings.  2. Mild degenerative changes.        Electronically Signed    By: Marin Olp M.D.    On: 11/29/2020 08:23    Patient Stated Goals Pt wants to improve right shoulder mobility and decrease pain with use.    Currently in Pain? Yes    Pain Score 2     Pain Location Shoulder    Pain Orientation Right;Left  Pain Descriptors / Indicators Aching    Pain Onset More than a month ago             MANUAL THERAPY:  Seated Pec Stretch 4 x 30 sec  Upper Trap Stretch Bilateral 4 x 30 sec   Neck Flexion Stretch 30 sec   THEREX:  Shoulder AAROM Flexion/Extension Pulleys 3 x 10 Shoulder AAROM Abduction/Adduction Pulleys 3 x 10   Education about proper body mechanics to assist wife in supine to sidelying and sidelying to sit transfers.   -Pushing pt into sidelying rather than pulling on chuck to avoid strain on shoulders. -Bringing legs off of bed when on side of bed. -Raising incline of bed to reduce the amount of effort required to lift pt  -Placing pillow under legs to avoid abrasion from railings on side pt is exiting from     No updates to exercise. Instructed pt to take photos of bed to analyze setup to reduce strain on his shoulders when  transfering wife.           PT Short Term Goals - 12/29/20 1151       PT SHORT TERM GOAL #1   Title Patient will demonstrate understanding of home exercise plan to perform independently for self-treatment of condition.    Baseline 11/14: NT    Time 2    Period Weeks    Status On-going    Target Date 12/30/20               PT Long Term Goals - 12/29/20 1151       PT LONG TERM GOAL #1   Title Patient will have improved function and activity level as evidenced by an increase in FOTO score by 10 points or more.    Baseline 11/14: 50/64    Time 8    Period Weeks    Status On-going      PT LONG TERM GOAL #2   Title Patient will improve shoulder AROM to >=130 degrees of flexion and abduction to perform overhead tasks to care for his wife.    Baseline 11/14: Bilateral Shoulder Abduction and Flexion 130    Time 8    Period Weeks    Status On-going      PT LONG TERM GOAL #3   Title Patient will improve right shoulder strength by >=1/2 MMT to better perform UE activities such as helping to move in bed.    Baseline 11/14: R Shoulder Flex 4/5, abd 3+/5, ER at 0 deg 4/5    Time 8    Period Weeks    Status On-going                   Plan - 12/29/20 1108     Clinical Impression Statement Pt presents for f/u for bilateral shoulder pain 2/2 potential impingement or RTC pathology for continued strain when transfering wife who is bed bound. Pt educated on proper bed setup and body mechanics to avoid placing stain on shoulder musculature. Pt demonstrated competence in using these techniques and he will attempt to practice independently when transfering wife at his home. Pt will continue to benefit from skilled PT to increase shoulder ROM and strength and to reduce shoulder pain to continue to be able to independently care for his wife and perform overhead activities without an increase in his pain.    Personal Factors and Comorbidities Age;Time since onset of  injury/illness/exacerbation    Examination-Activity Limitations Reach Overhead;Carry;Lift;Caring for Others  Examination-Participation Restrictions Cleaning;Meal Prep    Stability/Clinical Decision Making Stable/Uncomplicated    Rehab Potential Fair   RTC injury appears to be closer to tendinosis with severity of ROM and strength limitation   PT Frequency 2x / week    PT Duration 8 weeks    PT Treatment/Interventions Moist Heat;Aquatic Therapy;Dry needling;Passive range of motion;Therapeutic exercise;Therapeutic activities;Joint Manipulations;Patient/family education;Splinting;Electrical Stimulation;Manual techniques    PT Next Visit Plan Continued practice of lifting techniques and periscapular strengthening    PT Home Exercise Plan [email or web address]    Consulted and Agree with Plan of Care Patient             Patient will benefit from skilled therapeutic intervention in order to improve the following deficits and impairments:  Pain, Impaired UE functional use, Decreased range of motion, Decreased strength, Obesity  Visit Diagnosis: Chronic right shoulder pain  Left shoulder pain, unspecified chronicity     Problem List Patient Active Problem List   Diagnosis Date Noted   Frequency of urination and polyuria 12/02/2020   Right shoulder pain 12/02/2020   Need for influenza vaccination 11/20/2020   Bilateral shoulder pain 11/20/2020   OSA on CPAP 08/18/2020   CPAP use counseling 08/18/2020   Hypertension 08/18/2020   Ear pain 01/10/2020   Obesity (BMI 30-39.9) 01/08/2020   Headache 01/08/2020   Prediabetes 10/12/2019   Cyst of soft tissue 10/11/2019   Urinary frequency 10/11/2019   Small vessel disease ([place]) 10/25/2017   Chest pain 08/24/2017   Pernicious anemia 06/10/2016   Advanced care planning/counseling discussion 06/10/2016   Screening for prostate cancer 06/10/2015   Nephrolithiasis 06/10/2015   Erectile dysfunction 06/10/2015   BPH with obstruction/lower urinary  tract symptoms 05/15/2015   Benign essential HTN 10/30/2014   Hypothyroidism 10/30/2014   B12 deficiency 06/13/2014   Memory loss 06/13/2014   Bradly Chris PT, DPT  12/29/2020, 12:02 PM  [place] [facility] [address] [place], [address] Phone: [phone]   Fax:  [phone]  Name: Joshua [name] MRN: [id number] Date of Birth: [birth year]

## 2020-12-30 ENCOUNTER — Other Ambulatory Visit: Payer: Self-pay

## 2020-12-30 ENCOUNTER — Encounter: Payer: Self-pay | Admitting: Internal Medicine

## 2020-12-30 ENCOUNTER — Ambulatory Visit (INDEPENDENT_AMBULATORY_CARE_PROVIDER_SITE_OTHER): Payer: Medicare Other | Admitting: Internal Medicine

## 2020-12-30 VITALS — BP 140/82 | HR 66 | Temp 97.9°F | Ht 66.54 in | Wt 219.0 lb

## 2020-12-30 DIAGNOSIS — N390 Urinary tract infection, site not specified: Secondary | ICD-10-CM | POA: Insufficient documentation

## 2020-12-30 DIAGNOSIS — M25511 Pain in right shoulder: Secondary | ICD-10-CM

## 2020-12-30 DIAGNOSIS — G8929 Other chronic pain: Secondary | ICD-10-CM

## 2020-12-30 DIAGNOSIS — M25611 Stiffness of right shoulder, not elsewhere classified: Secondary | ICD-10-CM | POA: Diagnosis not present

## 2020-12-30 LAB — URINALYSIS, ROUTINE W REFLEX MICROSCOPIC
Bilirubin, UA: NEGATIVE
Glucose, UA: NEGATIVE
Ketones, UA: NEGATIVE
Leukocytes,UA: NEGATIVE
Nitrite, UA: NEGATIVE
Protein,UA: NEGATIVE
RBC, UA: NEGATIVE
Specific Gravity, UA: 1.02 (ref 1.005–1.030)
Urobilinogen, Ur: 0.2 mg/dL (ref 0.2–1.0)
pH, UA: 5.5 (ref 5.0–7.5)

## 2020-12-30 LAB — BAYER DCA HB A1C WAIVED: HB A1C (BAYER DCA - WAIVED): 6 % — ABNORMAL HIGH (ref 4.8–5.6)

## 2020-12-30 MED ORDER — FEXOFENADINE HCL 180 MG PO TABS: 180.0000 mg | ORAL_TABLET | Freq: Every day | ORAL | 1 refills | Status: DC

## 2020-12-30 MED ORDER — FLUTICASONE PROPIONATE 50 MCG/ACT NA SUSP: 2.0000 | Freq: Every day | NASAL | 6 refills | Status: DC

## 2020-12-30 NOTE — Progress Notes (Signed)
BP 140/82   Pulse 66   Temp 97.9 F (36.6 C) (Oral)   Ht 5' 6.54" (1.69 m)   Wt 219 lb (99.3 kg)   SpO2 97%   BMI 34.78 kg/m    Subjective:    Patient ID: Joshua [name], male    DOB: 1950/08/05, 70 y.o.   MRN: [id number]  Chief Complaint  Patient presents with   Shoulder Pain    Patient states that he has been going to PT, seems to be getting better    HPI: Joshua [name] is a 70 y.o. male  Pt is here for a follow up   Shoulder Pain  The pain is present in the right shoulder and left shoulder. This is a chronic problem. The quality of the pain is described as dull. Associated symptoms include a limited range of motion. Pertinent negatives include no fever, inability to bear weight, itching, joint locking, joint swelling, numbness, stiffness or tingling.  Urinary Frequency  This is a new problem. The current episode started more than 1 month ago. Associated symptoms include frequency.   Chief Complaint  Patient presents with   Shoulder Pain    Patient states that he has been going to PT, seems to be getting better    Relevant past medical, surgical, family and social history reviewed and updated as indicated. Interim medical history since our last visit reviewed. Allergies and medications reviewed and updated.  Review of Systems  Constitutional:  Negative for fever.  Genitourinary:  Positive for frequency.  Musculoskeletal:  Negative for stiffness.  Skin:  Negative for itching.  Neurological:  Negative for tingling and numbness.   Per HPI unless specifically indicated above     Objective:    BP 140/82   Pulse 66   Temp 97.9 F (36.6 C) (Oral)   Ht 5' 6.54" (1.69 m)   Wt 219 lb (99.3 kg)   SpO2 97%   BMI 34.78 kg/m   Wt Readings from Last 3 Encounters:  12/30/20 219 lb (99.3 kg)  12/01/20 220 lb 3.2 oz (99.9 kg)  11/27/20 220 lb 2 oz (99.8 kg)    Physical Exam Vitals and nursing note reviewed.  Constitutional:      General: He is not in acute  distress.    Appearance: Normal appearance. He is not ill-appearing or diaphoretic.  HENT:     Head: Normocephalic and atraumatic.     Right Ear: Tympanic membrane and external ear normal. There is no impacted cerumen.     Left Ear: External ear normal.     Nose: No congestion or rhinorrhea.     Mouth/Throat:     Pharynx: No oropharyngeal exudate or posterior oropharyngeal erythema.  Eyes:     Conjunctiva/sclera: Conjunctivae normal.     Pupils: Pupils are equal, round, and reactive to light.  Cardiovascular:     Rate and Rhythm: Normal rate and regular rhythm.     Heart sounds: No murmur heard.   No friction rub. No gallop.  Pulmonary:     Effort: No respiratory distress.     Breath sounds: No stridor. No wheezing or rhonchi.  Chest:     Chest wall: No tenderness.  Abdominal:     General: Abdomen is flat. Bowel sounds are normal.     Palpations: Abdomen is soft. There is no mass.     Tenderness: There is no abdominal tenderness.  Musculoskeletal:     Cervical back: Normal range of motion and neck supple.  No rigidity or tenderness.     Left lower leg: No edema.  Skin:    General: Skin is warm and dry.  Neurological:     Mental Status: He is alert.    Results for orders placed or performed in visit on 12/01/20  Urine Culture   Specimen: Urine   UR  Result Value Ref Range   Urine Culture, Routine Final report (A)    Organism ID, Bacteria Escherichia coli (A)    Antimicrobial Susceptibility Comment   Microscopic Examination   Urine  Result Value Ref Range   WBC, UA 11-30 (A) 0 - 5 /hpf   RBC 0-2 0 - 2 /hpf   Epithelial Cells (non renal) 0-10 0 - 10 /hpf   Mucus, UA Present (A) Not Estab.   Bacteria, UA Many (A) None seen/Few  Urinalysis, Routine w reflex microscopic  Result Value Ref Range   Specific Gravity, UA 1.010 1.005 - 1.030   pH, UA 6.5 5.0 - 7.5   Color, UA Yellow Yellow   Appearance Ur Cloudy (A) Clear   Leukocytes,UA 2+ (A) Negative   Protein,UA  Negative Negative/Trace   Glucose, UA Negative Negative   Ketones, UA Negative Negative   RBC, UA Trace (A) Negative   Bilirubin, UA Negative Negative   Urobilinogen, Ur 0.2 0.2 - 1.0 mg/dL   Nitrite, UA Negative Negative   Microscopic Examination See below:         Current Outpatient Medications:    levothyroxine (SYNTHROID) 125 MCG tablet, Take 1 tablet (125 mcg total) by mouth daily., Disp: 90 tablet, Rfl: 4   losartan (COZAAR) 100 MG tablet, Take 1 tablet (100 mg total) by mouth daily., Disp: 90 tablet, Rfl: 3   methylPREDNISolone (MEDROL DOSEPAK) 4 MG TBPK tablet, Use as directed, Disp: 1 each, Rfl: 0   Multiple Vitamins-Minerals (EYE VITAMINS PO), Take by mouth., Disp: , Rfl:     Assessment & Plan:   Right shoulder pain : sec to chronic repititve movt with him having to help his wife who is bedridden.  Physical therapy helps some.  Consider MRI declines it at this point.   FINDINGS: Mild degenerative changes over the [facility] joint and glenohumeral joints. No acute fracture or dislocation. No focal bony abnormality.    IMPRESSION: 1. No acute findings. 2. Mild degenerative changes. Will refer to ortho for the same.   Nocturia/ urinary frequency will need to see urology Has allergies to flomax ( hives per epic and pts verbal record. Recurrent utis a1c - prediabetes a1c 6.0   Orders Placed This Encounter  Procedures   Urinalysis, Routine w reflex microscopic   Comprehensive metabolic panel   Bayer DCA Hb A1c Waived (STAT)   Ambulatory referral to Orthopedics     Meds ordered this encounter  Medications   fexofenadine (ALLEGRA ALLERGY) 180 MG tablet    Sig: Take 1 tablet (180 mg total) by mouth daily.    Dispense:  10 tablet    Refill:  1   fluticasone (FLONASE) 50 MCG/ACT nasal spray    Sig: Place 2 sprays into both nostrils daily.    Dispense:  16 g    Refill:  6     Follow up plan: No follow-ups on file.

## 2020-12-31 ENCOUNTER — Ambulatory Visit: Payer: Medicare Other | Admitting: Physical Therapy

## 2020-12-31 DIAGNOSIS — G8929 Other chronic pain: Secondary | ICD-10-CM | POA: Diagnosis not present

## 2020-12-31 DIAGNOSIS — M25512 Pain in left shoulder: Secondary | ICD-10-CM | POA: Diagnosis not present

## 2020-12-31 DIAGNOSIS — R350 Frequency of micturition: Secondary | ICD-10-CM | POA: Diagnosis not present

## 2020-12-31 DIAGNOSIS — M25511 Pain in right shoulder: Secondary | ICD-10-CM | POA: Diagnosis not present

## 2020-12-31 DIAGNOSIS — R3589 Other polyuria: Secondary | ICD-10-CM | POA: Diagnosis not present

## 2020-12-31 LAB — COMPREHENSIVE METABOLIC PANEL
ALT: 13 IU/L (ref 0–44)
AST: 19 IU/L (ref 0–40)
Albumin/Globulin Ratio: 1.8 (ref 1.2–2.2)
Albumin: 4.1 g/dL (ref 3.7–4.7)
Alkaline Phosphatase: 87 IU/L (ref 44–121)
BUN/Creatinine Ratio: 14 (ref 10–24)
BUN: 11 mg/dL (ref 8–27)
Bilirubin Total: 0.5 mg/dL (ref 0.0–1.2)
CO2: 24 mmol/L (ref 20–29)
Calcium: 9.1 mg/dL (ref 8.6–10.2)
Chloride: 104 mmol/L (ref 96–106)
Creatinine, Ser: 0.8 mg/dL (ref 0.76–1.27)
Globulin, Total: 2.3 g/dL (ref 1.5–4.5)
Glucose: 96 mg/dL (ref 70–99)
Potassium: 3.9 mmol/L (ref 3.5–5.2)
Sodium: 142 mmol/L (ref 134–144)
Total Protein: 6.4 g/dL (ref 6.0–8.5)
eGFR: 94 mL/min/{1.73_m2} (ref >59)

## 2020-12-31 NOTE — Therapy (Signed)
[place] [facility] [address], [place], [address] Phone: [phone]   Fax:  [phone]  Physical Therapy Treatment  Patient Details  Name: Joshua [name] MRN: [id number] Date of Birth: [birth year] Referring Provider (PT): Charlynne Cousins MD   Encounter Date: 12/31/2020   PT End of Session - 12/31/20 1027     Visit Number 6    Number of Visits 16    Date for PT Re-Evaluation 02/10/21    PT Start Time 0930    PT Stop Time 2706    PT Time Calculation (min) 45 min    Activity Tolerance Patient tolerated treatment well    Behavior During Therapy [facility] for tasks assessed/performed             Past Medical History:  Diagnosis Date   Allergy    Amnesia 06/13/2014   B12 deficiency 06/13/2014   Benign essential HTN 10/30/2014   BPH (benign prostatic hyperplasia)    BPH with obstruction/lower urinary tract symptoms 05/15/2015   History of kidney stones    Hypogonadism in male    Hypothyroidism 10/30/2014    Past Surgical History:  Procedure Laterality Date   COLONOSCOPY  March 2016   COLONOSCOPY WITH PROPOFOL N/A 05/29/2020   Procedure: COLONOSCOPY WITH PROPOFOL;  Surgeon: Jonathon Bellows, MD;  Location: [facility] ENDOSCOPY;  Service: Gastroenterology;  Laterality: N/A;   ESOPHAGOGASTRODUODENOSCOPY ENDOSCOPY  March 2016   EYE SURGERY Bilateral    cataracts   KNEE SURGERY Right    SHOULDER SURGERY Left    TONSILLECTOMY      There were no vitals filed for this visit.   Subjective Assessment - 12/31/20 0932     Subjective Pt reports increased bilateral shoulder pain. He believes that this is due to having to move his wife last night.    Pertinent History Unable to move the right shoulder above 45 degrees without pain.  No weakness noted from [name] MD.  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 He also mentions h/o of  fracturing his shoulder ten years ago after falling from ladder, but he cannot remember which shoulder. Pt also reports that there are times were room was spinning and he checked it and it was ok. Pt describes having a bad memory and briefly mentions having a left rotator cuff surgery but cannot remember when.    Limitations Lifting;House hold activities    How long can you sit comfortably? N/a    How long can you stand comfortably? N/a    How long can you walk comfortably? N/a    Diagnostic tests CLINICAL DATA:  Right shoulder pain chronically.     EXAM:  RIGHT SHOULDER - 2+ VIEW     COMPARISON:  None.     FINDINGS:  Mild degenerative changes over the [facility] joint and glenohumeral joints.  No acute fracture or dislocation. No focal bony abnormality.     IMPRESSION:  1. No acute findings.  2. Mild degenerative changes.        Electronically Signed    By: Marin Olp M.D.    On: 11/29/2020 08:23    Patient Stated Goals Pt wants to improve right shoulder mobility and decrease pain with use.    Currently in Pain? Yes    Pain Score 3     Pain Location Shoulder    Pain  Orientation Right;Left    Pain Descriptors / Indicators Aching    Pain Type Chronic pain    Pain Onset More than a month ago            THEREX:  AAROM Shoulder Flexion/Extension Pulleys 3 x 10  AAROM Shoulder Abduction/Adduction Pulleys 3 x 10  Seated Thoracic Extension 1 x 10  Upper Trap Stretch 4 x 30 sec  -min VC to sequence exercise and how to modify to increase stretch  Posterior Neck Stretch 2 x 30 sec  Anterior Neck Stretch 2 x 30 sec   Supine Shoulder Flexion/Extension Wand AAROM 3 x 10  -Pt experiences pain from shoulder flexion to extension at 70 degrees flexion.   Instructed pt to complete exercises while watching television and to call insurance about purchasing a new bed that has electronic motors.   Updated HEP and educated patient on changes to exercises and addition of new exercises                PT Short Term Goals - 12/31/20 1026       PT SHORT TERM GOAL #1   Title Patient will demonstrate understanding of home exercise plan to perform independently for self-treatment of condition.    Baseline 11/14: NT    Time 2    Period Weeks    Status On-going    Target Date 12/30/20               PT Long Term Goals - 12/31/20 1026       PT LONG TERM GOAL #1   Title Patient will have improved function and activity level as evidenced by an increase in FOTO score by 10 points or more.    Baseline 11/14: 50/64    Time 8    Period Weeks    Status On-going      PT LONG TERM GOAL #2   Title Patient will improve shoulder AROM to >=130 degrees of flexion and abduction to perform overhead tasks to care for his wife.    Baseline 11/14: Bilateral Shoulder Abduction and Flexion 130    Time 8    Period Weeks    Status On-going      PT LONG TERM GOAL #3   Title Patient will improve right shoulder strength by >=1/2 MMT to better perform UE activities such as helping to move in bed.    Baseline 11/14: R Shoulder Flex 4/5, abd 3+/5, ER at 0 deg 4/5    Time 8    Period Weeks    Status On-going                   Plan - 12/31/20 1028     Clinical Impression Statement Pt presents for f/u for bilateral shoulder pain 2/2 potential impingement from having to transfer wife who is bed bound. She continues to experience increased pain with shoulder flexion from 60 to 130 degrees in when extending going from 150 to 70 degrees. While pt's shoulder ROM is improving, his progress is limited by his inability to consistently follow HEP due to obligations caring for his wife. PT discussed time management strategies and importance of pt engaging in HEP to see benefits.  Pt will continue to benefit from skilled PT to increase shoulder ROM and strength and to reduce shoulder pain to continue to be able to independently care for his wife and perform overhead activities  without an increase in his pain.    Personal Factors and  Comorbidities Age;Time since onset of injury/illness/exacerbation    Examination-Activity Limitations Reach Overhead;Carry;Lift;Caring for Others    Examination-Participation Restrictions Cleaning;Meal Prep    Stability/Clinical Decision Making Stable/Uncomplicated    Rehab Potential Fair   RTC injury appears to be closer to tendinosis with severity of ROM and strength limitation   PT Frequency 2x / week    PT Duration 8 weeks    PT Treatment/Interventions Moist Heat;Aquatic Therapy;Dry needling;Passive range of motion;Therapeutic exercise;Therapeutic activities;Joint Manipulations;Patient/family education;Splinting;Electrical Stimulation;Manual techniques    PT Next Visit Plan Continue to progress shoulder mobility exercises and strengthening within available ROM.    PT Home Exercise Plan [email or web address]    Consulted and Agree with Plan of Care Patient            HEP includes the following:  Access Code: [email or web address] URL: [email or web address] Date: 12/31/2020 Prepared by: Bradly Chris  Exercises Seated Gentle Upper Trapezius Stretch - 1 x daily - 7 x weekly - 1 sets - 3 reps - 60 hold Supine Chest Stretch on Foam Roll - 1 x daily - 7 x weekly - 1 sets - 3 reps - 60 hold Seated Cervical Retraction - 1 x daily - 7 x weekly - 3 sets - 10 reps Supine Shoulder Flexion Extension AAROM with Dowel - 1 x daily - 7 x weekly - 3 sets - 10 reps Standing Shoulder Row with Anchored Resistance - 1 x daily - 3 x weekly - 3 sets - 10 reps  Patient will benefit from skilled therapeutic intervention in order to improve the following deficits and impairments:  Pain, Impaired UE functional use, Decreased range of motion, Decreased strength, Obesity  Visit Diagnosis: Chronic right shoulder pain  Left shoulder pain, unspecified chronicity     Problem List Patient Active Problem List   Diagnosis Date Noted   Decreased ROM of right  shoulder 12/30/2020   Recurrent UTI 12/30/2020   Frequency of urination and polyuria 12/02/2020   Right shoulder pain 12/02/2020   Need for influenza vaccination 11/20/2020   Bilateral shoulder pain 11/20/2020   OSA on CPAP 08/18/2020   CPAP use counseling 08/18/2020   Hypertension 08/18/2020   Ear pain 01/10/2020   Obesity (BMI 30-39.9) 01/08/2020   Headache 01/08/2020   Prediabetes 10/12/2019   Cyst of soft tissue 10/11/2019   Urinary frequency 10/11/2019   Small vessel disease ([place]) 10/25/2017   Chest pain 08/24/2017   Pernicious anemia 06/10/2016   Advanced care planning/counseling discussion 06/10/2016   Screening for prostate cancer 06/10/2015   Nephrolithiasis 06/10/2015   Erectile dysfunction 06/10/2015   BPH with obstruction/lower urinary tract symptoms 05/15/2015   Benign essential HTN 10/30/2014   Hypothyroidism 10/30/2014   B12 deficiency 06/13/2014   Memory loss 06/13/2014   Bradly Chris PT, DPT  12/31/2020, 10:37 AM  [place] [facility] [address] [place], [address] Phone: [phone]   Fax:  [phone]  Name: Joshua [name] MRN: [id number] Date of Birth: 1950-10-16

## 2021-01-05 ENCOUNTER — Ambulatory Visit: Payer: Medicare Other | Admitting: Physical Therapy

## 2021-01-05 ENCOUNTER — Telehealth: Payer: Self-pay | Admitting: Gastroenterology

## 2021-01-05 ENCOUNTER — Encounter: Payer: Self-pay | Admitting: Gastroenterology

## 2021-01-05 NOTE — Telephone Encounter (Signed)
Inbound call from pt requesting a call back stating that he has questions regarding his procedure. Please advise. Thank you.

## 2021-01-06 ENCOUNTER — Ambulatory Visit
Admission: RE | Admit: 2021-01-06 | Discharge: 2021-01-06 | Disposition: A | Discharge status: Home / Self Care | Payer: Medicare Other | Source: Ambulatory Visit | Attending: Gastroenterology | Admitting: Gastroenterology

## 2021-01-06 ENCOUNTER — Ambulatory Visit: Payer: Medicare Other | Admitting: Anesthesiology

## 2021-01-06 ENCOUNTER — Encounter
Admission: RE | Disposition: A | Discharge status: Home / Self Care | Payer: Self-pay | Source: Ambulatory Visit | Attending: Gastroenterology

## 2021-01-06 ENCOUNTER — Encounter: Payer: Self-pay | Admitting: Gastroenterology

## 2021-01-06 DIAGNOSIS — E039 Hypothyroidism, unspecified: Secondary | ICD-10-CM | POA: Diagnosis not present

## 2021-01-06 DIAGNOSIS — K5730 Diverticulosis of large intestine without perforation or abscess without bleeding: Secondary | ICD-10-CM | POA: Diagnosis not present

## 2021-01-06 DIAGNOSIS — Z1211 Encounter for screening for malignant neoplasm of colon: Secondary | ICD-10-CM | POA: Diagnosis not present

## 2021-01-06 DIAGNOSIS — D124 Benign neoplasm of descending colon: Secondary | ICD-10-CM | POA: Insufficient documentation

## 2021-01-06 DIAGNOSIS — D122 Benign neoplasm of ascending colon: Secondary | ICD-10-CM | POA: Insufficient documentation

## 2021-01-06 DIAGNOSIS — G4733 Obstructive sleep apnea (adult) (pediatric): Secondary | ICD-10-CM | POA: Diagnosis not present

## 2021-01-06 DIAGNOSIS — Z86010 Personal history of colonic polyps: Secondary | ICD-10-CM

## 2021-01-06 DIAGNOSIS — I10 Essential (primary) hypertension: Secondary | ICD-10-CM | POA: Diagnosis not present

## 2021-01-06 DIAGNOSIS — Z09 Encounter for follow-up examination after completed treatment for conditions other than malignant neoplasm: Secondary | ICD-10-CM | POA: Diagnosis present

## 2021-01-06 DIAGNOSIS — D126 Benign neoplasm of colon, unspecified: Secondary | ICD-10-CM

## 2021-01-06 DIAGNOSIS — G4730 Sleep apnea, unspecified: Secondary | ICD-10-CM | POA: Insufficient documentation

## 2021-01-06 DIAGNOSIS — K635 Polyp of colon: Secondary | ICD-10-CM | POA: Diagnosis not present

## 2021-01-06 HISTORY — PX: COLONOSCOPY WITH PROPOFOL: SHX5780

## 2021-01-06 SURGERY — COLONOSCOPY WITH PROPOFOL
Anesthesia: General

## 2021-01-06 MED ORDER — PROPOFOL 10 MG/ML IV BOLUS
INTRAVENOUS | Status: DC | PRN
  Administered 2021-01-06: 70 mg via INTRAVENOUS

## 2021-01-06 MED ORDER — LIDOCAINE HCL (CARDIAC) PF 100 MG/5ML IV SOSY
PREFILLED_SYRINGE | INTRAVENOUS | Status: DC | PRN
  Administered 2021-01-06: 60 mg via INTRAVENOUS

## 2021-01-06 MED ORDER — PROPOFOL 500 MG/50ML IV EMUL
INTRAVENOUS | Status: DC | PRN
  Administered 2021-01-06: 140 ug/kg/min via INTRAVENOUS

## 2021-01-06 NOTE — Anesthesia Postprocedure Evaluation (Signed)
Anesthesia Post Note  Patient: Joshua [name]  Procedure(s) Performed: COLONOSCOPY WITH PROPOFOL  Patient location during evaluation: PACU Anesthesia Type: General Level of consciousness: awake and alert Pain management: pain level controlled Vital Signs Assessment: post-procedure vital signs reviewed and stable Respiratory status: spontaneous breathing, nonlabored ventilation, respiratory function stable and patient connected to nasal cannula oxygen Cardiovascular status: blood pressure returned to baseline and stable Postop Assessment: no apparent nausea or vomiting Anesthetic complications: no   No notable events documented.   Last Vitals:  Vitals:   01/06/21 0920 01/06/21 0930  BP: 93/65 104/73  Pulse: 75 73  Resp: 17 18  Temp: 36.8 C   SpO2: 96% 98%    Last Pain:  Vitals:   01/06/21 0920  TempSrc: Temporal  PainSc:                  Velna Hatchet

## 2021-01-06 NOTE — Op Note (Signed)
[facility] Gastroenterology Patient Name: Joshua [name] Procedure Date: 01/06/2021 8:50 AM MRN: [id number] Account #: [id number] Date of Birth: 28-Dec-1950 Admit Type: Outpatient Age: 70 Room: [facility] ENDO ROOM 2 Gender: Male Note Status: Finalized Instrument Name: [id number] Procedure:             Colonoscopy Indications:           Surveillance: Piecemeal removal of large sessile                         adenoma last colonoscopy (< 3 yrs) Providers:             Jonathon Bellows MD, MD Referring MD:          Charlynne Cousins (Referring MD) Medicines:             Monitored Anesthesia Care Complications:         No immediate complications. Procedure:             Pre-Anesthesia Assessment:                        - Prior to the procedure, a History and Physical was                         performed, and patient medications, allergies and                         sensitivities were reviewed. The patient's tolerance                         of previous anesthesia was reviewed.                        - The risks and benefits of the procedure and the                         sedation options and risks were discussed with the                         patient. All questions were answered and informed                         consent was obtained.                        - ASA Grade Assessment: II - A patient with mild                         systemic disease.                        After obtaining informed consent, the colonoscope was                         passed under direct vision. Throughout the procedure,                         the patient's blood pressure, pulse, and oxygen                         saturations were  monitored continuously. The                         Colonoscope was introduced through the anus and                         advanced to the the cecum, identified by the                         appendiceal orifice. The Colonoscope was introduced                          through the anus and advanced to the the cecum,                         identified by the appendiceal orifice. The colonoscopy                         was performed with ease. The patient tolerated the                         procedure well. The quality of the bowel preparation                         was good. Findings:      The perianal and digital rectal examinations were normal.      Two sessile polyps were found in the ascending colon. The polyps were 4       to 6 mm in size. These polyps were removed with a cold snare. Resection       and retrieval were complete.      A 10 mm post polypectomy scar was found in the descending colon. There       was residual polypoid tissue. The polyp was removed with a cold snare.       Resection and retrieval were complete.      The exam was otherwise without abnormality on direct and retroflexion       views.      Multiple small and large-mouthed diverticula were found in the right       colon.      The exam was otherwise without abnormality on direct and retroflexion       views. Impression:            - Two 4 to 6 mm polyps in the ascending colon, removed                         with a cold snare. Resected and retrieved.                        - Post-polypectomy scar in the descending colon.                        - The examination was otherwise normal on direct and                         retroflexion views.                        - Diverticulosis in the right colon.                        -  The examination was otherwise normal on direct and                         retroflexion views. Recommendation:        - Discharge patient to home (with escort).                        - Resume previous diet.                        - Continue present medications.                        - Await pathology results.                        - Repeat colonoscopy in 1 year for surveillance based                         on pathology results. Jonathon Bellows,  MD Jonathon Bellows MD, MD 01/06/2021 9:23:21 AM This report has been signed electronically. Number of Addenda: 0 Note Initiated On: 01/06/2021 8:50 AM Scope Withdrawal Time: 0 hours 15 minutes 49 seconds  Total Procedure Duration: 0 hours 18 minutes 37 seconds  Estimated Blood Loss:  Estimated blood loss: none.      [facility]

## 2021-01-06 NOTE — H&P (Signed)
Jonathon Bellows, MD [address] [place], [place], [place], [address] [address], [place], [place], [place], [address] Phone: [phone]  Fax: [phone]  Primary Care Physician:  Charlynne Cousins, MD   Pre-Procedure History & Physical: HPI:  Joshua [name] is a 70 y.o. male is here for an colonoscopy.   Past Medical History:  Diagnosis Date   Allergy    Amnesia 06/13/2014   B12 deficiency 06/13/2014   Benign essential HTN 10/30/2014   BPH (benign prostatic hyperplasia)    BPH with obstruction/lower urinary tract symptoms 05/15/2015   History of kidney stones    Hypogonadism in male    Hypothyroidism 10/30/2014    Past Surgical History:  Procedure Laterality Date   COLONOSCOPY  March 2016   COLONOSCOPY WITH PROPOFOL N/A 05/29/2020   Procedure: COLONOSCOPY WITH PROPOFOL;  Surgeon: Jonathon Bellows, MD;  Location: [facility] ENDOSCOPY;  Service: Gastroenterology;  Laterality: N/A;   ESOPHAGOGASTRODUODENOSCOPY ENDOSCOPY  March 2016   EYE SURGERY Bilateral    cataracts   KNEE SURGERY Right    SHOULDER SURGERY Left    TONSILLECTOMY      Prior to Admission medications   Medication Sig Start Date End Date Taking? Authorizing Provider  levothyroxine (SYNTHROID) 125 MCG tablet Take 1 tablet (125 mcg total) by mouth daily. 06/09/20  Yes Vigg, Avanti, MD  losartan (COZAAR) 100 MG tablet Take 1 tablet (100 mg total) by mouth daily. 10/28/20 10/28/21 Yes Vigg, Avanti, MD  Multiple Vitamins-Minerals (EYE VITAMINS PO) Take by mouth.   Yes [provider]  fexofenadine (ALLEGRA ALLERGY) 180 MG tablet Take 1 tablet (180 mg total) by mouth daily. 12/30/20   Vigg, Avanti, MD  fluticasone (FLONASE) 50 MCG/ACT nasal spray Place 2 sprays into both nostrils daily. 12/30/20   Charlynne Cousins, MD  methylPREDNISolone (MEDROL DOSEPAK) 4 MG TBPK tablet Use as directed Patient not taking: Reported on 01/06/2021 11/27/20   Charlynne Cousins, MD    Allergies as of 11/20/2020 - Review Complete 11/20/2020  Allergen  Reaction Noted   Aspirin Hives 10/30/2014   Celery oil Swelling 10/30/2014   Flomax [tamsulosin hcl] Hives 05/26/2015   Hydromorphone Hives 10/30/2014   Oysters [shellfish allergy]  10/25/2017   Amlodipine besylate Swelling 08/24/2017   Latex Rash 10/30/2014    Family History  Problem Relation Age of Onset   Breast cancer Mother    COPD Father    Heart disease Father    Prostate cancer Father     Social History   Socioeconomic History   Marital status: Married    Spouse name: Not on file   Number of children: Not on file   Years of education: Not on file   Highest education level: High school graduate  Occupational History   Not on file  Tobacco Use   Smoking status: Never   Smokeless tobacco: Never  Vaping Use   Vaping Use: Never used  Substance and Sexual Activity   Alcohol use: Yes    Alcohol/week: 0.0 standard drinks    Comment: Rarely   Drug use: No   Sexual activity: Not Currently  Other Topics Concern   Not on file  Social History Narrative   Not on file   Social Determinants of Health   Financial Resource Strain: Low Risk    Difficulty of Paying Living Expenses: Not hard at all  Food Insecurity: No Food Insecurity   Worried About [place] in the Last Year: Never true   [employer]  in the Last Year: Never true  Transportation Needs: No Transportation Needs   Lack of Transportation (Medical): No   Lack of Transportation (Non-Medical): No  Physical Activity: Inactive   Days of Exercise per Week: 0 days   Minutes of Exercise per Session: 0 min  Stress: No Stress Concern Present   Feeling of Stress : Not at all  Social Connections: Not on file  Intimate Partner Violence: Not on file    Review of Systems: See HPI, otherwise negative ROS  Physical Exam: BP (!) 156/133   Pulse 82   Temp 98.2 F (36.8 C) (Temporal)   Resp 18   Ht 5' 6.5" (1.689 m)   Wt 100 kg   SpO2 98%   BMI 35.05 kg/m  General:   Alert,  pleasant and  cooperative in NAD Head:  Normocephalic and atraumatic. Neck:  Supple; no masses or thyromegaly. Lungs:  Clear throughout to auscultation, normal respiratory effort.    Heart:  +S1, +S2, Regular rate and rhythm, No edema. Abdomen:  Soft, nontender and nondistended. Normal bowel sounds, without guarding, and without rebound.   Neurologic:  Alert and  oriented x4;  grossly normal neurologically.  Impression/Plan: Joshua [name] is here for an colonoscopy to be performed for surveillance due to prior history of colon polyps   Risks, benefits, limitations, and alternatives regarding  colonoscopy have been reviewed with the patient.  Questions have been answered.  All parties agreeable.   Jonathon Bellows, MD  01/06/2021, 8:45 AM

## 2021-01-06 NOTE — Transfer of Care (Signed)
Immediate Anesthesia Transfer of Care Note  Patient: Joshua [name]  Procedure(s) Performed: COLONOSCOPY WITH PROPOFOL  Patient Location: Endoscopy Unit  Anesthesia Type:General  Level of Consciousness: drowsy  Airway & Oxygen Therapy: Patient Spontanous Breathing  Post-op Assessment: Report given to RN and Post -op Vital signs reviewed and stable  Post vital signs: Reviewed and stable  Last Vitals:  Vitals Value Taken Time  BP    Temp    Pulse 78 01/06/21 0923  Resp 17 01/06/21 0923  SpO2 97 % 01/06/21 0923  Vitals shown include unvalidated device data.  Last Pain:  Vitals:   01/06/21 0833  TempSrc: Temporal  PainSc: 0-No pain         Complications: No notable events documented.

## 2021-01-06 NOTE — Anesthesia Preprocedure Evaluation (Addendum)
Anesthesia Evaluation  Patient identified by MRN, date of birth, ID band Patient awake    Reviewed: Allergy & Precautions, NPO status , Patient's Chart, lab work & pertinent test results  History of Anesthesia Complications Negative for: history of anesthetic complications  Airway Mallampati: III  TM Distance: <3 FB Neck ROM: Full    Dental   Pulmonary sleep apnea and Continuous Positive Airway Pressure Ventilation ,    Pulmonary exam normal        Cardiovascular hypertension, Normal cardiovascular exam     Neuro/Psych  Headaches,   Hx of amnesia 2016  HOH negative psych ROS   GI/Hepatic negative GI ROS, Neg liver ROS,   Endo/Other  Hypothyroidism   Renal/GU      BPH    Musculoskeletal negative musculoskeletal ROS (+)   Abdominal   Peds  Hematology negative hematology ROS (+)   Anesthesia Other Findings EKG 2019 - NSR, LAD, LVH, RBBB  Reproductive/Obstetrics                            Anesthesia Physical Anesthesia Plan  ASA: 3  Anesthesia Plan: General   Post-op Pain Management:    Induction:   PONV Risk Score and Plan:   Airway Management Planned: Natural Airway  Additional Equipment:   Intra-op Plan:   Post-operative Plan:   Informed Consent: I have reviewed the patients History and Physical, chart, labs and discussed the procedure including the risks, benefits and alternatives for the proposed anesthesia with the patient or authorized representative who has indicated his/her understanding and acceptance.       Plan Discussed with: CRNA  Anesthesia Plan Comments:         Anesthesia Quick Evaluation

## 2021-01-06 NOTE — Telephone Encounter (Signed)
Patient is at the hospital today having his procedure.

## 2021-01-07 ENCOUNTER — Encounter: Payer: Self-pay | Admitting: Gastroenterology

## 2021-01-07 ENCOUNTER — Ambulatory Visit: Payer: Medicare Other | Admitting: Physical Therapy

## 2021-01-07 LAB — SURGICAL PATHOLOGY

## 2021-01-07 NOTE — Telephone Encounter (Signed)
Called pt to inquiry further about pt's absences from this week and whether he would like to make apt to maintain frequency.

## 2021-01-12 ENCOUNTER — Ambulatory Visit: Payer: Medicare Other | Attending: Internal Medicine | Admitting: Physical Therapy

## 2021-01-12 DIAGNOSIS — M25512 Pain in left shoulder: Secondary | ICD-10-CM | POA: Diagnosis not present

## 2021-01-12 DIAGNOSIS — M25511 Pain in right shoulder: Secondary | ICD-10-CM | POA: Diagnosis not present

## 2021-01-12 DIAGNOSIS — G8929 Other chronic pain: Secondary | ICD-10-CM | POA: Diagnosis not present

## 2021-01-12 NOTE — Therapy (Addendum)
[facility] [address], [place], [address] Phone: [phone]   Fax:  [phone]  Physical Therapy Treatment  Patient Details  Name: Joshua [name] MRN: [id number] Date of Birth: 09-25-1950 Referring Provider (PT): Charlynne Cousins MD   Encounter Date: 01/12/2021   PT End of Session - 01/12/21 1111     Visit Number 7    Number of Visits 16    Date for PT Re-Evaluation 02/10/21    PT Start Time 1100    PT Stop Time 1027    PT Time Calculation (min) 45 min    Activity Tolerance Patient tolerated treatment well    Behavior During Therapy [facility] for tasks assessed/performed             Past Medical History:  Diagnosis Date   Allergy    Amnesia 06/13/2014   B12 deficiency 06/13/2014   Benign essential HTN 10/30/2014   BPH (benign prostatic hyperplasia)    BPH with obstruction/lower urinary tract symptoms 05/15/2015   History of kidney stones    Hypogonadism in male    Hypothyroidism 10/30/2014    Past Surgical History:  Procedure Laterality Date   COLONOSCOPY  March 2016   COLONOSCOPY WITH PROPOFOL N/A 05/29/2020   Procedure: COLONOSCOPY WITH PROPOFOL;  Surgeon: Jonathon Bellows, MD;  Location: [facility] ENDOSCOPY;  Service: Gastroenterology;  Laterality: N/A;   COLONOSCOPY WITH PROPOFOL N/A 01/06/2021   Procedure: COLONOSCOPY WITH PROPOFOL;  Surgeon: Jonathon Bellows, MD;  Location: [facility] ENDOSCOPY;  Service: Gastroenterology;  Laterality: N/A;   ESOPHAGOGASTRODUODENOSCOPY ENDOSCOPY  March 2016   EYE SURGERY Bilateral    cataracts   KNEE SURGERY Right    SHOULDER SURGERY Left    TONSILLECTOMY      There were no vitals filed for this visit.   Subjective Assessment - 01/12/21 1108     Subjective Pt reports that he has been unable to his exercises and he is struggling to manage his time to do his exercises. He recently had to pickup a deer and place it into his car.    Pertinent History Unable to move the right  shoulder above 45 degrees without pain.  No weakness noted from [name] MD.  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 He also mentions h/o of fracturing his shoulder ten years ago after falling from ladder, but he cannot remember which shoulder. Pt also reports that there are times were room was spinning and he checked it and it was ok. Pt describes having a bad memory and briefly mentions having a left rotator cuff surgery but cannot remember when.    Limitations Lifting;House hold activities    How long can you sit comfortably? N/a    How long can you stand comfortably? N/a    How long can you walk comfortably? N/a    Diagnostic tests CLINICAL DATA:  Right shoulder pain chronically.     EXAM:  RIGHT SHOULDER - 2+ VIEW     COMPARISON:  None.     FINDINGS:  Mild degenerative changes over the [facility] joint and glenohumeral joints.  No acute fracture or dislocation. No focal bony abnormality.     IMPRESSION:  1. No acute findings.  2. Mild degenerative changes.        Electronically Signed    By: Marin Olp M.D.    On: 11/29/2020  08:23    Patient Stated Goals Pt wants to improve right shoulder mobility and decrease pain with use.    Currently in Pain? Yes    Pain Score 3     Pain Location Shoulder    Pain Orientation Right    Pain Descriptors / Indicators Aching    Pain Type Chronic pain    Pain Onset More than a month ago             THEREX:   AAROM Shoulder Pulleys Flexion/Extension 3 x 10   AAROM Shoulder Pulleys Abduction/Adduction 3 x 10    Pec Stretch unilateral 4 x 30 sec   Upper Trap Stretch 4 x 30 sec   Shoulder Flexion/Extension AAROM Wand 3 x 10  Shoulder Abduction/Adduction AAROM Wand 3 x 10   Seated Rows 3 x 10 with Red TB   Only added pec stretch as additional exercise to HEP.         PT Education - 01/12/21 1110     Education Details form and technique for  appropriate exercise    Person(s) Educated Patient    Methods Explanation;Demonstration;Verbal cues;Handout    Comprehension Verbalized understanding;Returned demonstration;Verbal cues required              PT Short Term Goals - 01/12/21 1121       PT SHORT TERM GOAL #1   Title Patient will demonstrate understanding of home exercise plan to perform independently for self-treatment of condition.    Baseline 11/14: NT    Time 2    Period Weeks    Status On-going    Target Date 12/30/20               PT Long Term Goals - 01/12/21 1122       PT LONG TERM GOAL #1   Title Patient will have improved function and activity level as evidenced by an increase in FOTO score by 10 points or more.    Baseline 11/14: 50/64    Time 8    Period Weeks    Status On-going      PT LONG TERM GOAL #2   Title Patient will improve shoulder AROM to >=130 degrees of flexion and abduction to perform overhead tasks to care for his wife.    Baseline 11/14: Bilateral Shoulder Abduction and Flexion 130    Time 8    Period Weeks    Status On-going      PT LONG TERM GOAL #3   Title Patient will improve right shoulder strength by >=1/2 MMT to better perform UE activities such as helping to move in bed.    Baseline 11/14: R Shoulder Flex 4/5, abd 3+/5, ER at 0 deg 4/5    Time 8    Period Weeks    Status On-going                   Plan - 01/12/21 1111     Clinical Impression Statement Pt presents for f/u for bilateral shoulder pain 2/2 to potential impingment. He exhibits limited progress and limited ability to complete HEP. Pt continues to exhibit decreased shoulder ROM and pain with overhead movement. He also continues to perform same lifting techniques because of wife's ongoing care needs and need for max A transfers. PT discussed need to complete HEP in order to progress and improve symptoms. Pt is aware, and commits to following HEP more consistently. He will continue to benefit from  skilled PT to improve  shoulder ROM strength and ROM and to decrease pain to continue to care fo rhis wife and perform overhead activities like dressing himself with no or limited pain response.    Personal Factors and Comorbidities Age;Time since onset of injury/illness/exacerbation    Examination-Activity Limitations Reach Overhead;Carry;Lift;Caring for Others    Examination-Participation Restrictions Cleaning;Meal Prep    Stability/Clinical Decision Making Stable/Uncomplicated    Clinical Decision Making Low    Rehab Potential Fair   RTC injury appears to be closer to tendinosis with severity of ROM and strength limitation   PT Frequency 2x / week    PT Duration 8 weeks    PT Treatment/Interventions Moist Heat;Aquatic Therapy;Dry needling;Passive range of motion;Therapeutic exercise;Therapeutic activities;Joint Manipulations;Patient/family education;Splinting;Electrical Stimulation;Manual techniques    PT Next Visit Plan Check HEP compliance and progress shoulder exercises accordingly as well as persicapular strengthening exercises    PT Home Exercise Plan [email or web address]    Consulted and Agree with Plan of Care Patient            HEP includes the following:  Access Code: [email or web address] URL: [email or web address] Date: 01/12/2021 Prepared by: Bradly Chris  Exercises Seated Gentle Upper Trapezius Stretch - 1 x daily - 7 x weekly - 1 sets - 3 reps - 60 hold Supine Chest Stretch on Foam Roll - 1 x daily - 7 x weekly - 1 sets - 3 reps - 60 hold Seated Cervical Retraction - 1 x daily - 7 x weekly - 3 sets - 10 reps Supine Shoulder Flexion Extension AAROM with Dowel - 1 x daily - 7 x weekly - 3 sets - 10 reps Standing Shoulder Row with Anchored Resistance - 1 x daily - 3 x weekly - 3 sets - 10 reps   Patient will benefit from skilled therapeutic intervention in order to improve the following deficits and impairments:  Pain, Impaired UE functional use, Decreased range of motion,  Decreased strength, Obesity  Visit Diagnosis: Chronic right shoulder pain  Left shoulder pain, unspecified chronicity     Problem List Patient Active Problem List   Diagnosis Date Noted   Decreased ROM of right shoulder 12/30/2020   Recurrent UTI 12/30/2020   Frequency of urination and polyuria 12/02/2020   Right shoulder pain 12/02/2020   Need for influenza vaccination 11/20/2020   Bilateral shoulder pain 11/20/2020   OSA on CPAP 08/18/2020   CPAP use counseling 08/18/2020   Hypertension 08/18/2020   Ear pain 01/10/2020   Obesity (BMI 30-39.9) 01/08/2020   Headache 01/08/2020   Prediabetes 10/12/2019   Cyst of soft tissue 10/11/2019   Urinary frequency 10/11/2019   Small vessel disease ([place]) 10/25/2017   Chest pain 08/24/2017   Pernicious anemia 06/10/2016   Advanced care planning/counseling discussion 06/10/2016   Screening for prostate cancer 06/10/2015   Nephrolithiasis 06/10/2015   Erectile dysfunction 06/10/2015   BPH with obstruction/lower urinary tract symptoms 05/15/2015   Benign essential HTN 10/30/2014   Hypothyroidism 10/30/2014   B12 deficiency 06/13/2014   Memory loss 06/13/2014   Bradly Chris PT, DPT  01/12/2021, 5:05 PM  [place] [facility] [address], [place], [address] Phone: [phone]   Fax:  [phone]  Name: Joshua [name] MRN: [id number] Date of Birth: [birth year]

## 2021-01-14 ENCOUNTER — Ambulatory Visit: Payer: Medicare Other | Admitting: Physical Therapy

## 2021-01-14 DIAGNOSIS — M25512 Pain in left shoulder: Secondary | ICD-10-CM

## 2021-01-14 DIAGNOSIS — G8929 Other chronic pain: Secondary | ICD-10-CM

## 2021-01-14 DIAGNOSIS — M25511 Pain in right shoulder: Secondary | ICD-10-CM

## 2021-01-14 NOTE — Therapy (Signed)
[place] [facility] [address], [place], [address] Phone: [phone]   Fax:  [phone]  Physical Therapy Treatment  Patient Details  Name: Joshua [name] MRN: [id number] Date of Birth: Mar 13, 1950 Referring Provider (PT): Charlynne Cousins MD   Encounter Date: 01/14/2021   PT End of Session - 01/14/21 1113     Visit Number 8    Number of Visits 16    Date for PT Re-Evaluation 02/10/21    PT Start Time 1100    PT Stop Time 2703    PT Time Calculation (min) 45 min    Activity Tolerance Patient tolerated treatment well    Behavior During Therapy [facility] for tasks assessed/performed             Past Medical History:  Diagnosis Date   Allergy    Amnesia 06/13/2014   B12 deficiency 06/13/2014   Benign essential HTN 10/30/2014   BPH (benign prostatic hyperplasia)    BPH with obstruction/lower urinary tract symptoms 05/15/2015   History of kidney stones    Hypogonadism in male    Hypothyroidism 10/30/2014    Past Surgical History:  Procedure Laterality Date   COLONOSCOPY  March 2016   COLONOSCOPY WITH PROPOFOL N/A 05/29/2020   Procedure: COLONOSCOPY WITH PROPOFOL;  Surgeon: Jonathon Bellows, MD;  Location: [facility] ENDOSCOPY;  Service: Gastroenterology;  Laterality: N/A;   COLONOSCOPY WITH PROPOFOL N/A 01/06/2021   Procedure: COLONOSCOPY WITH PROPOFOL;  Surgeon: Jonathon Bellows, MD;  Location: [facility] ENDOSCOPY;  Service: Gastroenterology;  Laterality: N/A;   ESOPHAGOGASTRODUODENOSCOPY ENDOSCOPY  March 2016   EYE SURGERY Bilateral    cataracts   KNEE SURGERY Right    SHOULDER SURGERY Left    TONSILLECTOMY      There were no vitals filed for this visit.   Subjective Assessment - 01/14/21 1104     Subjective Pt reports increased bilateral soreness because of having to move his wife and a hunting trip he took with his grandson.    Pertinent History Unable to move the right shoulder above 45 degrees without pain.  No weakness  noted from [name] MD.  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 He also mentions h/o of fracturing his shoulder ten years ago after falling from ladder, but he cannot remember which shoulder. Pt also reports that there are times were room was spinning and he checked it and it was ok. Pt describes having a bad memory and briefly mentions having a left rotator cuff surgery but cannot remember when.    Limitations Lifting;House hold activities    How long can you sit comfortably? N/a    How long can you stand comfortably? N/a    How long can you walk comfortably? N/a    Diagnostic tests CLINICAL DATA:  Right shoulder pain chronically.     EXAM:  RIGHT SHOULDER - 2+ VIEW     COMPARISON:  None.     FINDINGS:  Mild degenerative changes over the [employer] [facility] [employer] joint and glenohumeral joints.  No acute fracture or dislocation. No focal bony abnormality.     IMPRESSION:  1. No acute findings.  2. Mild degenerative changes.        Electronically Signed    By: Marin Olp M.D.    On: 11/29/2020 08:23    Patient Stated Goals Pt wants to improve right shoulder mobility  and decrease pain with use.    Currently in Pain? Yes    Pain Score 2     Pain Location Shoulder    Pain Orientation Right;Left    Pain Descriptors / Indicators Aching    Pain Type Chronic pain    Pain Onset More than a month ago            THEREX:   UBE  5 min forward and 5 min backwards  OMEGA Seated Rows #25  OMEGA Face Pulls 2 x 10  #5 OMEGA Lat Pulls 3 x 10 #25   Seated Thoracic Extension AAROM 3 x 10  Chin Tucks 3 x 10    Updated HEP and educated patient on changes to exercises and addition of new exercise seated thoracic extension.       PT Education - 01/14/21 1646     Education Details form and technique for appropriate exercise    Person(s) Educated Patient    Methods Explanation;Demonstration;Verbal cues;Handout     Comprehension Verbalized understanding;Returned demonstration;Verbal cues required              PT Short Term Goals - 01/14/21 1112       PT SHORT TERM GOAL #1   Title Patient will demonstrate understanding of home exercise plan to perform independently for self-treatment of condition.    Baseline 11/14: NT    Time 2    Period Weeks    Status On-going    Target Date 12/30/20               PT Long Term Goals - 01/14/21 1652       PT LONG TERM GOAL #1   Title Patient will have improved function and activity level as evidenced by an increase in FOTO score by 10 points or more.    Baseline 11/14: 50/64    Time 8    Period Weeks    Status On-going      PT LONG TERM GOAL #2   Title Patient will improve shoulder AROM to >=130 degrees of flexion and abduction to perform overhead tasks to care for his wife.    Baseline 11/14: Bilateral Shoulder Abduction and Flexion 130    Time 8    Period Weeks    Status On-going      PT LONG TERM GOAL #3   Title Patient will improve right shoulder strength by >=1/2 MMT to better perform UE activities such as helping to move in bed.    Baseline 11/14: R Shoulder Flex 4/5, abd 3+/5, ER at 0 deg 4/5    Time 8    Period Weeks    Status On-going                   Plan - 01/14/21 1112     Clinical Impression Statement Pt presents for f/u for bilateral shoulder pain 2/2 to potential impingment. Pt continues to show limited compliance with HEP and limited progress with shoulder mobility and strength. He will be reassessed next apt given that it has been a month since initial evaluation. Pt able to perform all exercises without increase pain. He will continue to benefit from skilled PT to improve shoulder ROM strength and ROM and to decrease pain to continue to care fo rhis wife and perform overhead activities like dressing himself with no or limited pain response.    Personal Factors and Comorbidities Age;Time since onset of  injury/illness/exacerbation    Examination-Activity Limitations Reach Overhead;Carry;Lift;Caring for  Others    Examination-Participation Restrictions Cleaning;Meal Prep    Stability/Clinical Decision Making Stable/Uncomplicated    Clinical Decision Making Low    Rehab Potential Fair   RTC injury appears to be closer to tendinosis with severity of ROM and strength limitation   PT Frequency 2x / week    PT Duration 8 weeks    PT Treatment/Interventions Moist Heat;Aquatic Therapy;Dry needling;Passive range of motion;Therapeutic exercise;Therapeutic activities;Joint Manipulations;Patient/family education;Splinting;Electrical Stimulation;Manual techniques    PT Next Visit Plan Reassess goals. Discuss HEP compliance    PT Home Exercise Plan [email or web address]    Consulted and Agree with Plan of Care Patient             Patient will benefit from skilled therapeutic intervention in order to improve the following deficits and impairments:  Pain, Impaired UE functional use, Decreased range of motion, Decreased strength, Obesity  Visit Diagnosis: Chronic right shoulder pain  Left shoulder pain, unspecified chronicity  HEP includes the following:   Access Code: [email or web address] URL: [email or web address] Date: 01/14/2021 Prepared by: Bradly Chris  Exercises Seated Gentle Upper Trapezius Stretch - 1 x daily - 7 x weekly - 1 sets - 3 reps - 60 hold Supine Chest Stretch on Foam Roll - 1 x daily - 7 x weekly - 1 sets - 3 reps - 60 hold Seated Thoracic Extension AROM - 1 x daily - 3 x weekly - 3 sets - 10 reps Seated Cervical Retraction - 1 x daily - 7 x weekly - 3 sets - 10 reps Supine Shoulder Flexion Extension AAROM with Dowel - 1 x daily - 7 x weekly - 3 sets - 10 reps Standing Shoulder Row with Anchored Resistance - 1 x daily - 3 x weekly - 3 sets - 10 reps     Problem List Patient Active Problem List   Diagnosis Date Noted   Decreased ROM of right shoulder 12/30/2020   Recurrent  UTI 12/30/2020   Frequency of urination and polyuria 12/02/2020   Right shoulder pain 12/02/2020   Need for influenza vaccination 11/20/2020   Bilateral shoulder pain 11/20/2020   OSA on CPAP 08/18/2020   CPAP use counseling 08/18/2020   Hypertension 08/18/2020   Ear pain 01/10/2020   Obesity (BMI 30-39.9) 01/08/2020   Headache 01/08/2020   Prediabetes 10/12/2019   Cyst of soft tissue 10/11/2019   Urinary frequency 10/11/2019   Small vessel disease ([place]) 10/25/2017   Chest pain 08/24/2017   Pernicious anemia 06/10/2016   Advanced care planning/counseling discussion 06/10/2016   Screening for prostate cancer 06/10/2015   Nephrolithiasis 06/10/2015   Erectile dysfunction 06/10/2015   BPH with obstruction/lower urinary tract symptoms 05/15/2015   Benign essential HTN 10/30/2014   Hypothyroidism 10/30/2014   B12 deficiency 06/13/2014   Memory loss 06/13/2014   Bradly Chris PT, DPT  01/14/2021, 4:54 PM  [place] [facility] [address] [place], [address] Phone: [phone]   Fax:  [phone]  Name: Joshua [name] MRN: [id number] Date of Birth: 18-Feb-1950

## 2021-01-19 ENCOUNTER — Ambulatory Visit: Payer: Medicare Other | Admitting: Physical Therapy

## 2021-01-19 DIAGNOSIS — M7541 Impingement syndrome of right shoulder: Secondary | ICD-10-CM | POA: Diagnosis not present

## 2021-01-21 ENCOUNTER — Ambulatory Visit: Payer: Medicare Other | Admitting: Physical Therapy

## 2021-01-21 ENCOUNTER — Telehealth: Payer: Self-pay | Admitting: Physical Therapy

## 2021-01-21 DIAGNOSIS — M7541 Impingement syndrome of right shoulder: Secondary | ICD-10-CM | POA: Diagnosis not present

## 2021-01-21 DIAGNOSIS — M75121 Complete rotator cuff tear or rupture of right shoulder, not specified as traumatic: Secondary | ICD-10-CM | POA: Diagnosis not present

## 2021-01-21 DIAGNOSIS — M19011 Primary osteoarthritis, right shoulder: Secondary | ICD-10-CM | POA: Diagnosis not present

## 2021-01-21 NOTE — Telephone Encounter (Signed)
Called pt to check in about whether he would be able to make apt, because he was at an apt for an MRI. Pt reports feeling unsure and that he would rather come to therapy tomorrow to avoid stress of rushing to apt.

## 2021-01-22 ENCOUNTER — Ambulatory Visit: Payer: Medicare Other | Admitting: Physical Therapy

## 2021-01-22 DIAGNOSIS — G8929 Other chronic pain: Secondary | ICD-10-CM | POA: Diagnosis not present

## 2021-01-22 DIAGNOSIS — M25512 Pain in left shoulder: Secondary | ICD-10-CM | POA: Diagnosis not present

## 2021-01-22 DIAGNOSIS — M25511 Pain in right shoulder: Secondary | ICD-10-CM | POA: Diagnosis not present

## 2021-01-22 NOTE — Therapy (Addendum)
[place] [facility] [address], [place], [address] Phone: [phone]   Fax:  [phone]  Physical Therapy Discharge Summary   Patient Details  Name: Joshua [name] MRN: [id number] Date of Birth: [birth year] Referring Provider (PT): Charlynne Cousins MD   Encounter Date: 01/22/2021   PT End of Session - 01/22/21 1523     Visit Number 9    Number of Visits 16    Date for PT Re-Evaluation 02/10/21    PT Start Time 1500    PT Stop Time 8341    PT Time Calculation (min) 45 min    Activity Tolerance Patient tolerated treatment well    Behavior During Therapy [facility] for tasks assessed/performed             Past Medical History:  Diagnosis Date   Allergy    Amnesia 06/13/2014   B12 deficiency 06/13/2014   Benign essential HTN 10/30/2014   BPH (benign prostatic hyperplasia)    BPH with obstruction/lower urinary tract symptoms 05/15/2015   History of kidney stones    Hypogonadism in male    Hypothyroidism 10/30/2014    Past Surgical History:  Procedure Laterality Date   COLONOSCOPY  March 2016   COLONOSCOPY WITH PROPOFOL N/A 05/29/2020   Procedure: COLONOSCOPY WITH PROPOFOL;  Surgeon: Jonathon Bellows, MD;  [facility] ENDOSCOPY;  Service: Gastroenterology;  Laterality: N/A;   COLONOSCOPY WITH PROPOFOL N/A 01/06/2021   Procedure: COLONOSCOPY WITH PROPOFOL;  Surgeon: Jonathon Bellows, MD;  Location: [facility] ENDOSCOPY;  Service: Gastroenterology;  Laterality: N/A;   ESOPHAGOGASTRODUODENOSCOPY ENDOSCOPY  March 2016   EYE SURGERY Bilateral    cataracts   KNEE SURGERY Right    SHOULDER SURGERY Left    TONSILLECTOMY      There were no vitals filed for this visit.   Subjective Assessment - 01/22/21 1510     Subjective Pt reports that he has not been able to consistently follow HEP. He has a MRI apt tomorrow.    Pertinent History Unable to move the right shoulder above 45 degrees without pain.  No weakness noted from Avanti  Vigg MD.  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 He also mentions h/o of fracturing his shoulder ten years ago after falling from ladder, but he cannot remember which shoulder. Pt also reports that there are times were room was spinning and he checked it and it was ok. Pt describes having a bad memory and briefly mentions having a left rotator cuff surgery but cannot remember when.    Limitations Lifting;House hold activities    How long can you sit comfortably? N/a    How long can you stand comfortably? N/a    How long can you walk comfortably? N/a    Diagnostic tests CLINICAL DATA:  Right shoulder pain chronically.     EXAM:  RIGHT SHOULDER - 2+ VIEW     COMPARISON:  None.     FINDINGS:  Mild degenerative changes over the [facility] joint and glenohumeral joints.  No acute fracture or dislocation. No focal bony abnormality.     IMPRESSION:  1. No acute findings.  2. Mild degenerative changes.        Electronically Signed    By: Marin Olp M.D.    On: 11/29/2020 08:23    Patient Stated Goals Pt wants to improve right shoulder mobility and  decrease pain with use.    Currently in Pain? Yes    Pain Score 1     Pain Location Shoulder    Pain Orientation Right    Pain Descriptors / Indicators Aching    Pain Type Chronic pain    Pain Onset More than a month ago    Aggravating Factors  Moving his wife             THEREX:  UBE 10 min 5 min forward and 5 min backward   Bilateral Shoulder Abduction 130, Bilateral Shoulder Flex 120  R Shoulder Flex 4/5, abd 3+/5, ER at 0 deg 4/5 12/22: R Shoulder Flex 4/5, Abd 4/5, 0 deg ER 3+/5  Discussion about strategies given that PT was not effective and surgical intervention is not an option.  -Home PT  -Additional Equip: Use of Clarise Cruz Steady to help move wife https://www.amazon.com/[employer]jo[employer]-Sara-Stedy-[email or web address]/dp/B0771SNBLC     PT Education - 01/22/21  1516     Education Details form and technique for appropriate exercise    Person(s) Educated Patient    Methods Handout;Verbal cues;Demonstration;Explanation    Comprehension Verbalized understanding;Returned demonstration;Verbal cues required              PT Short Term Goals - 01/22/21 1630       PT SHORT TERM GOAL #1   Title Patient will demonstrate understanding of home exercise plan to perform independently for self-treatment of condition.    Baseline 11/14: NT 12/22: Unable to complete consistently    Time 2    Period Weeks    Status Not Met    Target Date 12/30/20               PT Long Term Goals - 01/22/21 1519       PT LONG TERM GOAL #1   Title Patient will have improved function and activity level as evidenced by an increase in FOTO score by 10 points or more.    Baseline 11/14: 50/64 12/22: 44/64    Time 8    Period Weeks    Status Not Met      PT LONG TERM GOAL #2   Title Patient will improve shoulder AROM to >=130 degrees of flexion and abduction to perform overhead tasks to care for his wife.    Baseline 11/14: Bilateral Shoulder Abduction and Flexion 130  12/22: Bilateral Shoulder Abduction 130, Bilateral Shoulder Flex 120    Time 8    Period Weeks    Status Not Met      PT LONG TERM GOAL #3   Title Patient will improve right shoulder strength by >=1/2 MMT to better perform UE activities such as helping to move in bed.    Baseline 11/14: R Shoulder Flex 4/5, abd 3+/5, ER at 0 deg 4/5 12/22: R Shoulder Flex 4/5, Abd 4/5, 0 deg ER 3+/5    Time 8    Period Weeks    Status Not Met                   Plan - 01/22/21 1626     Clinical Impression Statement Pt presents for f/u for bilateral shoulder pain 2/2 to potential impingement and RTC. Pt has not made progress towards PT goals with ongoing irritating activity lifting and helping to move wife as her primary caregiver. Because of ongoing pain, he has had limited follow through with his HEP.  Pt will not seek additional surgical intervention for treatment given need to be a caregiver  for his wife and inability to not perform caregiver functions when he receives his surgery. PT discussed strategies to reduce irritable activities such as using additional lifting equipment and reaching out to [facility] for additional guidance and treatment of his wife. Pt is in agreement with this plan and he will discharge from PT.    Personal Factors and Comorbidities Age;Time since onset of injury/illness/exacerbation    Examination-Activity Limitations Reach Overhead;Carry;Lift;Caring for Others    Examination-Participation Restrictions Cleaning;Meal Prep    Stability/Clinical Decision Making Stable/Uncomplicated    Clinical Decision Making Low    Rehab Potential Fair   RTC injury appears to be closer to tendinosis with severity of ROM and strength limitation   PT Frequency 2x / week    PT Duration 8 weeks    PT Treatment/Interventions Moist Heat;Aquatic Therapy;Dry needling;Passive range of motion;Therapeutic exercise;Therapeutic activities;Joint Manipulations;Patient/family education;Splinting;Electrical Stimulation;Manual techniques    PT Next Visit Plan N/a Discharge    PT Home Exercise Plan [email or web address]    Consulted and Agree with Plan of Care Patient             Patient will benefit from skilled therapeutic intervention in order to improve the following deficits and impairments:  Pain, Impaired UE functional use, Decreased range of motion, Decreased strength, Obesity  Visit Diagnosis: Chronic right shoulder pain  Left shoulder pain, unspecified chronicity     Problem List Patient Active Problem List   Diagnosis Date Noted   Decreased ROM of right shoulder 12/30/2020   Recurrent UTI 12/30/2020   Frequency of urination and polyuria 12/02/2020   Right shoulder pain 12/02/2020   Need for influenza vaccination 11/20/2020   Bilateral shoulder pain 11/20/2020   OSA on CPAP  08/18/2020   CPAP use counseling 08/18/2020   Hypertension 08/18/2020   Ear pain 01/10/2020   Obesity (BMI 30-39.9) 01/08/2020   Headache 01/08/2020   Prediabetes 10/12/2019   Cyst of soft tissue 10/11/2019   Urinary frequency 10/11/2019   Small vessel disease ([place]) 10/25/2017   Chest pain 08/24/2017   Pernicious anemia 06/10/2016   Advanced care planning/counseling discussion 06/10/2016   Screening for prostate cancer 06/10/2015   Nephrolithiasis 06/10/2015   Erectile dysfunction 06/10/2015   BPH with obstruction/lower urinary tract symptoms 05/15/2015   Benign essential HTN 10/30/2014   Hypothyroidism 10/30/2014   B12 deficiency 06/13/2014   Memory loss 06/13/2014   Bradly Chris PT, DPT  01/22/2021, 4:38 PM  [facility] [address], [place], [address] Phone: [phone]   Fax:  [phone]  Name: Joshua [name] MRN: [id number] Date of Birth: [birth year]

## 2021-01-23 DIAGNOSIS — M7541 Impingement syndrome of right shoulder: Secondary | ICD-10-CM | POA: Diagnosis not present

## 2021-01-27 DIAGNOSIS — H353221 Exudative age-related macular degeneration, left eye, with active choroidal neovascularization: Secondary | ICD-10-CM | POA: Diagnosis not present

## 2021-01-28 ENCOUNTER — Ambulatory Visit: Payer: Medicare Other | Admitting: Physical Therapy

## 2021-03-17 DIAGNOSIS — H4313 Vitreous hemorrhage, bilateral: Secondary | ICD-10-CM | POA: Diagnosis not present

## 2021-03-17 DIAGNOSIS — H353230 Exudative age-related macular degeneration, bilateral, stage unspecified: Secondary | ICD-10-CM | POA: Diagnosis not present

## 2021-04-21 DIAGNOSIS — H353221 Exudative age-related macular degeneration, left eye, with active choroidal neovascularization: Secondary | ICD-10-CM | POA: Diagnosis not present

## 2021-04-24 ENCOUNTER — Telehealth: Payer: Self-pay

## 2021-04-28 NOTE — Telephone Encounter (Signed)
error 

## 2021-07-14 DIAGNOSIS — H35033 Hypertensive retinopathy, bilateral: Secondary | ICD-10-CM | POA: Diagnosis not present

## 2021-07-14 DIAGNOSIS — H353221 Exudative age-related macular degeneration, left eye, with active choroidal neovascularization: Secondary | ICD-10-CM | POA: Diagnosis not present

## 2021-07-14 DIAGNOSIS — H35432 Paving stone degeneration of retina, left eye: Secondary | ICD-10-CM | POA: Diagnosis not present

## 2021-07-15 ENCOUNTER — Other Ambulatory Visit: Payer: Self-pay | Admitting: Internal Medicine

## 2021-07-16 NOTE — Telephone Encounter (Signed)
Requested Prescriptions  Pending Prescriptions Disp Refills  . levothyroxine (SYNTHROID) 125 MCG tablet [Pharmacy Med Name: LEVOTHYROXINE SODIUM 125 MCG Tablet] 90 tablet 0    Sig: TAKE 1 TABLET EVERY DAY     Endocrinology:  Hypothyroid Agents Passed - 07/15/2021  9:12 PM      Passed - TSH in normal range and within 360 days    TSH  Date Value Ref Range Status  11/13/2020 2.620 0.450 - 4.500 uIU/mL Final         Passed - Valid encounter within last 12 months    Recent Outpatient Visits          6 months ago Chronic right shoulder pain   [facility] Vigg, Avanti, MD   7 months ago Frequency of urination and polyuria   [facility] Vigg, Avanti, MD   7 months ago Right shoulder pain, unspecified chronicity   [facility] Vigg, Avanti, MD   7 months ago Need for influenza vaccination   [facility] Vigg, Avanti, MD   11 months ago Hypothyroidism, unspecified type   [place], [place]      Future Appointments            In 3 months [place], [place]

## 2021-08-10 DIAGNOSIS — L218 Other seborrheic dermatitis: Secondary | ICD-10-CM | POA: Diagnosis not present

## 2021-08-10 DIAGNOSIS — L237 Allergic contact dermatitis due to plants, except food: Secondary | ICD-10-CM | POA: Diagnosis not present

## 2021-08-10 DIAGNOSIS — D225 Melanocytic nevi of trunk: Secondary | ICD-10-CM | POA: Diagnosis not present

## 2021-08-10 DIAGNOSIS — D2272 Melanocytic nevi of left lower limb, including hip: Secondary | ICD-10-CM | POA: Diagnosis not present

## 2021-08-10 DIAGNOSIS — D485 Neoplasm of uncertain behavior of skin: Secondary | ICD-10-CM | POA: Diagnosis not present

## 2021-08-10 DIAGNOSIS — D2261 Melanocytic nevi of right upper limb, including shoulder: Secondary | ICD-10-CM | POA: Diagnosis not present

## 2021-08-10 DIAGNOSIS — D2262 Melanocytic nevi of left upper limb, including shoulder: Secondary | ICD-10-CM | POA: Diagnosis not present

## 2021-08-10 DIAGNOSIS — D3615 Benign neoplasm of peripheral nerves and autonomic nervous system of abdomen: Secondary | ICD-10-CM | POA: Diagnosis not present

## 2021-08-10 DIAGNOSIS — D2271 Melanocytic nevi of right lower limb, including hip: Secondary | ICD-10-CM | POA: Diagnosis not present

## 2021-08-10 DIAGNOSIS — B078 Other viral warts: Secondary | ICD-10-CM | POA: Diagnosis not present

## 2021-08-17 ENCOUNTER — Ambulatory Visit: Payer: Medicare Other | Admitting: Internal Medicine

## 2021-08-17 DIAGNOSIS — Z91199 Patient's noncompliance with other medical treatment and regimen due to unspecified reason: Secondary | ICD-10-CM

## 2021-08-17 NOTE — Progress Notes (Unsigned)
Pt did not show for scheduled appointment.  

## 2021-08-19 ENCOUNTER — Encounter: Payer: Self-pay | Admitting: Unknown Physician Specialty

## 2021-08-19 ENCOUNTER — Ambulatory Visit (INDEPENDENT_AMBULATORY_CARE_PROVIDER_SITE_OTHER): Payer: Medicare Other | Admitting: Unknown Physician Specialty

## 2021-08-19 VITALS — BP 125/71 | HR 65 | Temp 98.0°F | Wt 216.4 lb

## 2021-08-19 DIAGNOSIS — B029 Zoster without complications: Secondary | ICD-10-CM | POA: Diagnosis not present

## 2021-08-19 DIAGNOSIS — Z636 Dependent relative needing care at home: Secondary | ICD-10-CM | POA: Diagnosis not present

## 2021-08-19 MED ORDER — BETAMETHASONE DIPROPIONATE AUG 0.05 % EX CREA: TOPICAL_CREAM | Freq: Two times a day (BID) | CUTANEOUS | 0 refills | Status: DC

## 2021-08-19 MED ORDER — VALACYCLOVIR HCL 1 G PO TABS: 1000.0000 mg | ORAL_TABLET | Freq: Three times a day (TID) | ORAL | 0 refills | Status: AC

## 2021-08-19 NOTE — Assessment & Plan Note (Signed)
Pt with a lot of stress caring for ill wife. Will refer to social work.

## 2021-08-19 NOTE — Progress Notes (Signed)
BP 125/71   Pulse 65   Temp 98 F (36.7 C) (Oral)   Wt 216 lb 6.4 oz (98.2 kg)   SpO2 98%   BMI 34.40 kg/m    Subjective:    Patient ID: Joshua [name], male    DOB: 04/09/1950, 71 y.o.   MRN: [id number]  HPI: DOUGLAS SMOLINSKY is a 71 y.o. male  Chief Complaint  Patient presents with   Pain    Pt states he has been having pain in his left side and around to his back for about a week. States he takes care of his wife and thinks he hurt himself when turning her in bed. States he had an old RX for Prednisone and took 2 tablets of this. States this helped with the pain.    Pt states he cares for his ill wife.  Wonders if he pulled something on his side with turning.  Took some prednisone which eased it off.  Also wonders if he has a kidney stone.  Used a chest brace.  States it is a lot better.  Also started itching on that side.  Had some whelps and bumps.  Sxs for 8 days.    Relevant past medical, surgical, family and social history reviewed and updated as indicated. Interim medical history since our last visit reviewed. Allergies and medications reviewed and updated.  Review of Systems  Constitutional: Negative.   HENT: Negative.    Respiratory: Negative.    Psychiatric/Behavioral:         Lots of stress with caring for his ill wife    Per HPI unless specifically indicated above     Objective:    BP 125/71   Pulse 65   Temp 98 F (36.7 C) (Oral)   Wt 216 lb 6.4 oz (98.2 kg)   SpO2 98%   BMI 34.40 kg/m   Wt Readings from Last 3 Encounters:  08/19/21 216 lb 6.4 oz (98.2 kg)  01/06/21 220 lb 7.4 oz (100 kg)  12/30/20 219 lb (99.3 kg)    Physical Exam Constitutional:      General: He is not in acute distress.    Appearance: Normal appearance. He is well-developed.  HENT:     Head: Normocephalic and atraumatic.  Eyes:     General: Lids are normal. No scleral icterus.       Right eye: No discharge.        Left eye: No discharge.     Conjunctiva/sclera:  Conjunctivae normal.  Neck:     Vascular: No carotid bruit or JVD.  Cardiovascular:     Rate and Rhythm: Normal rate and regular rhythm.     Heart sounds: Normal heart sounds.  Pulmonary:     Effort: Pulmonary effort is normal. No respiratory distress.     Breath sounds: Normal breath sounds.  Abdominal:     Palpations: There is no hepatomegaly or splenomegaly.  Musculoskeletal:        General: Normal range of motion.     Cervical back: Normal range of motion and neck supple.  Skin:    General: Skin is warm and dry.     Coloration: Skin is not pale.     Findings: Rash present.     Comments: Erythemetous macular lesions on a dermatomal distribution  Neurological:     Mental Status: He is alert and oriented to person, place, and time.  Psychiatric:        Behavior: Behavior normal.  Thought Content: Thought content normal.        Judgment: Judgment normal.      Assessment & Plan:   Problem List Items Addressed This Visit       Unprioritized   Caregiver stress    Pt with a lot of stress caring for ill wife. Will refer to social work.        Relevant Orders   AMB Referral to [facility]   Other Visit Diagnoses     Herpes zoster without complication    -  Primary   Will rx Valtrex TID for 1 week.  Topical prednisone   Relevant Medications   ketoconazole (NIZORAL) 2 % shampoo   valACYclovir (VALTREX) 1000 MG tablet        Follow up plan: Return if symptoms worsen or fail to improve.

## 2021-08-25 ENCOUNTER — Telehealth: Payer: Self-pay

## 2021-08-25 NOTE — Chronic Care Management (AMB) (Signed)
  Chronic Care Management   Note  08/25/2021 Name: Joshua [name] MRN: [id number] DOB: [birth year]  Joshua [name] is a 71 y.o. year old male who is a primary care patient of [facility]. I reached out to Joshua [name] by phone today in response to a referral sent by Mr. Sherril Heyward Pagliaro's PCP.  Mr. Yu was given information about Chronic Care Management services today including:  CCM service includes personalized support from designated clinical staff supervised by his physician, including individualized plan of care and coordination with other care providers 24/7 contact phone numbers for assistance for urgent and routine care needs. Service will only be billed when office clinical staff spend 20 minutes or more in a month to coordinate care. Only one practitioner may furnish and bill the service in a calendar month. The patient may stop CCM services at any time (effective at the end of the month) by phone call to the office staff. The patient is responsible for co-pay (up to 20% after annual deductible is met) if co-pay is required by the individual health plan.   Patient did not agree to enrollment in care management services and does not wish to consider at this time.  Follow up plan: Patient declines further follow up and engagement by the care management team. Appropriate care team members and provider have been notified via electronic communication.   Noreene Larsson, [place], [place] [address] Direct Dial: [phone] [email or web address]

## 2021-08-31 ENCOUNTER — Ambulatory Visit (INDEPENDENT_AMBULATORY_CARE_PROVIDER_SITE_OTHER): Payer: Medicare Other | Admitting: Internal Medicine

## 2021-08-31 VITALS — BP 129/91 | HR 72 | Resp 12 | Ht 66.0 in | Wt 219.6 lb

## 2021-08-31 DIAGNOSIS — Z9989 Dependence on other enabling machines and devices: Secondary | ICD-10-CM

## 2021-08-31 DIAGNOSIS — Z7189 Other specified counseling: Secondary | ICD-10-CM | POA: Diagnosis not present

## 2021-08-31 DIAGNOSIS — I10 Essential (primary) hypertension: Secondary | ICD-10-CM

## 2021-08-31 DIAGNOSIS — G4733 Obstructive sleep apnea (adult) (pediatric): Secondary | ICD-10-CM | POA: Diagnosis not present

## 2021-08-31 NOTE — Progress Notes (Signed)
[facility], [place] [address]  Pulmonary Sleep Medicine   Office Visit Note  Patient Name: Joshua [name] DOB: [birth year] MRN [id number]    Chief Complaint: Obstructive Sleep Apnea visit  Brief History:  Tia is seen today for an annual follow up on APAP 9-16cmh20.  The patient has a 8 year  history of sleep apnea. Patient is using PAP nightly.  The patient feels rested after sleeping with PAP.  The patient reports benefit from PAP use. Reported sleepiness is  improved and the Epworth Sleepiness Score is 6 out of 24. The patient does  take a nap 2-3 times weekly 1-2 hrs or more. The patient complains of the following: oral dryness. Patient uses Biotene spray and tablets for this. He uses a chin strap and denies a major problem with oral venting.  The compliance download shows 98%  compliance with an average use time of 7 hours. 45 min The AHI is 3.7  The patient does not complain of limb movements disrupting sleep.  ROS  General: (-) fever, (-) chills, (-) night sweat Nose and Sinuses: (-) nasal stuffiness or itchiness, (-) postnasal drip, (-) nosebleeds, (-) sinus trouble. Mouth and Throat: (-) sore throat, (-) hoarseness. Neck: (-) swollen glands, (-) enlarged thyroid, (-) neck pain. Respiratory: - cough, - shortness of breath, - wheezing. Neurologic: - numbness, - tingling. Psychiatric: - anxiety, - depression   Current Medication: Outpatient Encounter Medications as of 08/31/2021  Medication Sig   acetaminophen (TYLENOL) 500 MG tablet Take 500 mg by mouth every 6 (six) hours as needed.   augmented betamethasone dipropionate (DIPROLENE AF) 0.05 % cream Apply topically 2 (two) times daily.   hydrocortisone 2.5 % lotion Apply topically.   ketoconazole (NIZORAL) 2 % shampoo Apply topically.   levothyroxine (SYNTHROID) 125 MCG tablet TAKE 1 TABLET EVERY DAY   losartan (COZAAR) 100 MG tablet Take 1 tablet (100 mg total) by mouth daily.    Multiple Vitamins-Minerals (EYE VITAMINS PO) Take by mouth.   No facility-administered encounter medications on file as of 08/31/2021.    Surgical History: Past Surgical History:  Procedure Laterality Date   COLONOSCOPY  March 2016   COLONOSCOPY WITH PROPOFOL N/A 05/29/2020   Procedure: COLONOSCOPY WITH PROPOFOL;  Surgeon: Jonathon Bellows, MD;  Location: [facility] ENDOSCOPY;  Service: Gastroenterology;  Laterality: N/A;   COLONOSCOPY WITH PROPOFOL N/A 01/06/2021   Procedure: COLONOSCOPY WITH PROPOFOL;  Surgeon: Jonathon Bellows, MD;  Location: [facility] ENDOSCOPY;  Service: Gastroenterology;  Laterality: N/A;   ESOPHAGOGASTRODUODENOSCOPY ENDOSCOPY  March 2016   EYE SURGERY Bilateral    cataracts   KNEE SURGERY Right    SHOULDER SURGERY Left    TONSILLECTOMY      Medical History: Past Medical History:  Diagnosis Date   Allergy    Amnesia 06/13/2014   B12 deficiency 06/13/2014   Benign essential HTN 10/30/2014   BPH (benign prostatic hyperplasia)    BPH with obstruction/lower urinary tract symptoms 05/15/2015   History of kidney stones    Hypogonadism in male    Hypothyroidism 10/30/2014    Family History: Non contributory to the present illness  Social History: Social History   Socioeconomic History   Marital status: Married    Spouse name: Not on file   Number of children: Not on file   Years of education: Not on file   Highest education level: High school graduate  Occupational History   Not on file  Tobacco Use   Smoking status: Never  Smokeless tobacco: Never  Vaping Use   Vaping Use: Never used  Substance and Sexual Activity   Alcohol use: Yes    Alcohol/week: 0.0 standard drinks of alcohol    Comment: Rarely   Drug use: No   Sexual activity: Not Currently  Other Topics Concern   Not on file  Social History Narrative   Not on file   Social Determinants of Health   Financial Resource Strain: Low Risk  (10/31/2020)   Overall Financial Resource Strain (CARDIA)    Difficulty  of Paying Living Expenses: Not hard at all  Food Insecurity: No Food Insecurity (10/31/2020)   Hunger Vital Sign    Worried About Running Out of Food in the Last Year: Never true    Ran Out of Food in the Last Year: Never true  Transportation Needs: No Transportation Needs (10/31/2020)   PRAPARE - Hydrologist (Medical): No    Lack of Transportation (Non-Medical): No  Physical Activity: Inactive (10/31/2020)   Exercise Vital Sign    Days of Exercise per Week: 0 days    Minutes of Exercise per Session: 0 min  Stress: No Stress Concern Present (10/31/2020)   [place]    Feeling of Stress : Not at all  Social Connections: Socially Isolated (07/09/2019)   Social Connection and Isolation Panel [NHANES]    Frequency of Communication with Friends and Family: Never    Frequency of Social Gatherings with Friends and Family: Never    Attends Religious Services: Never    Marine scientist or Organizations: No    Attends Archivist Meetings: Never    Marital Status: Married  Human resources officer Violence: Not At Risk (06/23/2017)   Humiliation, Afraid, Rape, and Kick questionnaire    Fear of Current or Ex-Partner: No    Emotionally Abused: No    Physically Abused: No    Sexually Abused: No    Vital Signs: Blood pressure (!) 129/91, pulse 72, resp. rate 12, height '5\' 6"'$  (1.676 m), weight 219 lb 9.6 oz (99.6 kg), SpO2 99 %. Body mass index is 35.44 kg/m.    Examination: General Appearance: The patient is well-developed, well-nourished, and in no distress. Neck Circumference: 49.5cm Skin: Gross inspection of skin unremarkable. Head: normocephalic, no gross deformities. Eyes: no gross deformities noted. ENT: ears appear grossly normal Neurologic: Alert and oriented. No involuntary movements.    EPWORTH SLEEPINESS SCALE:  Scale:  (0)= no chance of dozing; (1)= slight chance of  dozing; (2)= moderate chance of dozing; (3)= high chance of dozing  Chance  Situtation    Sitting and reading: 0    Watching TV: 2    Sitting Inactive in public: 1    As a passenger in car: 0      Lying down to rest: 2    Sitting and talking: 0    Sitting quielty after lunch: 1    In a car, stopped in traffic: 0   TOTAL SCORE:   6 out of 24    SLEEP STUDIES:  PSG- (06/14/2013)- AHI 22 ( Supine 59, Lateral 49), Min SP02 88% CPAP (06/28/13)- APAP 8-17   CPAP COMPLIANCE DATA:  Date Range: 08/29/20-08/28/21  Average Daily Use: 7 hours 45 min  Median Use: 7 hrs 53 min  Compliance for > 4 Hours: 98% days  AHI: 3.7 respiratory events per hour  Days Used: 361/365  Mask Leak: 31.1  95th Percentile  Pressure: 15.8         LABS: No results found for this or any previous visit (from the past 2160 hour(s)).  Radiology: No results found.  No results found.  No results found.    Assessment and Plan: Patient Active Problem List   Diagnosis Date Noted   Caregiver stress 08/19/2021   Decreased ROM of right shoulder 12/30/2020   Recurrent UTI 12/30/2020   Frequency of urination and polyuria 12/02/2020   Right shoulder pain 12/02/2020   Need for influenza vaccination 11/20/2020   Bilateral shoulder pain 11/20/2020   OSA on CPAP 08/18/2020   CPAP use counseling 08/18/2020   Hypertension 08/18/2020   Ear pain 01/10/2020   Obesity (BMI 30-39.9) 01/08/2020   Headache 01/08/2020   Prediabetes 10/12/2019   Cyst of soft tissue 10/11/2019   Urinary frequency 10/11/2019   Small vessel disease ([place]) 10/25/2017   Chest pain 08/24/2017   Pernicious anemia 06/10/2016   Advanced care planning/counseling discussion 06/10/2016   Screening for prostate cancer 06/10/2015   Nephrolithiasis 06/10/2015   Erectile dysfunction 06/10/2015   BPH with obstruction/lower urinary tract symptoms 05/15/2015   Benign essential HTN 10/30/2014   Hypothyroidism 10/30/2014   B12  deficiency 06/13/2014   Memory loss 06/13/2014    1. OSA on CPAP The patient does tolerate PAP and reports  benefit from PAP use. The patient was reminded how to clean equipment and advised to replace supplies routinely. The patient was also counselled on weight loss. The compliance is very good. The AHI is 3.7.   OSA on cpap- CPAP continues to be medically necessary to treat this patient's OSA.  Pt will continue with very good compliance. He will make more effort to be consistent with his chin strap. He is using the upper end of his pressure range so we will increase pressure to 9 to 17.   2. CPAP use counseling CPAP Counseling: had a lengthy discussion with the patient regarding the importance of PAP therapy in management of the sleep apnea. Patient appears to understand the risk factor reduction and also understands the risks associated with untreated sleep apnea. Patient will try to make a good faith effort to remain compliant with therapy. Also instructed the patient on proper cleaning of the device including the water must be changed daily if possible and use of distilled water is preferred. Patient understands that the machine should be regularly cleaned with appropriate recommended cleaning solutions that do not damage the PAP machine for example given white vinegar and water rinses. Other methods such as ozone treatment may not be as good as these simple methods to achieve cleaning.   3. Hypertension, unspecified type Hypertension Counseling:   The following hypertensive lifestyle modification were recommended and discussed:  1. Limiting alcohol intake to less than 1 oz/day of ethanol:(24 oz of beer or 8 oz of wine or 2 oz of 100-proof whiskey). 2. Take baby ASA 81 mg daily. 3. Importance of regular aerobic exercise and losing weight. 4. Reduce dietary saturated fat and cholesterol intake for overall cardiovascular health. 5. Maintaining adequate dietary potassium, calcium, and magnesium  intake. 6. Regular monitoring of the blood pressure. 7. Reduce sodium intake to less than 100 mmol/day (less than 2.3 gm of sodium or less than 6 gm of sodium choride)     General Counseling: I have discussed the findings of the evaluation and examination with Jeneen Rinks.  I have also discussed any further diagnostic evaluation thatmay be needed or ordered today. Jeneen Rinks verbalizes  understanding of the findings of todays visit. We also reviewed his medications today and discussed drug interactions and side effects including but not limited excessive drowsiness and altered mental states. We also discussed that there is always a risk not just to him but also people around him. he has been encouraged to call the office with any questions or concerns that should arise related to todays visit.  No orders of the defined types were placed in this encounter.       I have personally obtained a history, examined the patient, evaluated laboratory and imaging results, formulated the assessment and plan and placed orders. This patient was seen today by Tressie Ellis, PA-C in collaboration with Dr. Devona Konig.   Allyne Gee, MD [facility] Diplomate ABMS Pulmonary Critical Care Medicine and Sleep Medicine

## 2021-08-31 NOTE — Patient Instructions (Signed)

## 2021-09-02 DIAGNOSIS — M9905 Segmental and somatic dysfunction of pelvic region: Secondary | ICD-10-CM | POA: Diagnosis not present

## 2021-09-02 DIAGNOSIS — M9903 Segmental and somatic dysfunction of lumbar region: Secondary | ICD-10-CM | POA: Diagnosis not present

## 2021-09-02 DIAGNOSIS — M5136 Other intervertebral disc degeneration, lumbar region: Secondary | ICD-10-CM | POA: Diagnosis not present

## 2021-09-02 DIAGNOSIS — M5432 Sciatica, left side: Secondary | ICD-10-CM | POA: Diagnosis not present

## 2021-09-04 DIAGNOSIS — M5432 Sciatica, left side: Secondary | ICD-10-CM | POA: Diagnosis not present

## 2021-09-04 DIAGNOSIS — M5136 Other intervertebral disc degeneration, lumbar region: Secondary | ICD-10-CM | POA: Diagnosis not present

## 2021-09-04 DIAGNOSIS — M9903 Segmental and somatic dysfunction of lumbar region: Secondary | ICD-10-CM | POA: Diagnosis not present

## 2021-09-04 DIAGNOSIS — M9905 Segmental and somatic dysfunction of pelvic region: Secondary | ICD-10-CM | POA: Diagnosis not present

## 2021-09-07 DIAGNOSIS — M9905 Segmental and somatic dysfunction of pelvic region: Secondary | ICD-10-CM | POA: Diagnosis not present

## 2021-09-07 DIAGNOSIS — M5136 Other intervertebral disc degeneration, lumbar region: Secondary | ICD-10-CM | POA: Diagnosis not present

## 2021-09-07 DIAGNOSIS — M9903 Segmental and somatic dysfunction of lumbar region: Secondary | ICD-10-CM | POA: Diagnosis not present

## 2021-09-07 DIAGNOSIS — M5432 Sciatica, left side: Secondary | ICD-10-CM | POA: Diagnosis not present

## 2021-09-09 DIAGNOSIS — M5432 Sciatica, left side: Secondary | ICD-10-CM | POA: Diagnosis not present

## 2021-09-09 DIAGNOSIS — M5136 Other intervertebral disc degeneration, lumbar region: Secondary | ICD-10-CM | POA: Diagnosis not present

## 2021-09-09 DIAGNOSIS — M9905 Segmental and somatic dysfunction of pelvic region: Secondary | ICD-10-CM | POA: Diagnosis not present

## 2021-09-09 DIAGNOSIS — M9903 Segmental and somatic dysfunction of lumbar region: Secondary | ICD-10-CM | POA: Diagnosis not present

## 2021-09-14 DIAGNOSIS — M9903 Segmental and somatic dysfunction of lumbar region: Secondary | ICD-10-CM | POA: Diagnosis not present

## 2021-09-14 DIAGNOSIS — M9905 Segmental and somatic dysfunction of pelvic region: Secondary | ICD-10-CM | POA: Diagnosis not present

## 2021-09-14 DIAGNOSIS — M5432 Sciatica, left side: Secondary | ICD-10-CM | POA: Diagnosis not present

## 2021-09-14 DIAGNOSIS — M5136 Other intervertebral disc degeneration, lumbar region: Secondary | ICD-10-CM | POA: Diagnosis not present

## 2021-09-16 DIAGNOSIS — M5432 Sciatica, left side: Secondary | ICD-10-CM | POA: Diagnosis not present

## 2021-09-16 DIAGNOSIS — M9903 Segmental and somatic dysfunction of lumbar region: Secondary | ICD-10-CM | POA: Diagnosis not present

## 2021-09-16 DIAGNOSIS — M5136 Other intervertebral disc degeneration, lumbar region: Secondary | ICD-10-CM | POA: Diagnosis not present

## 2021-09-16 DIAGNOSIS — M9905 Segmental and somatic dysfunction of pelvic region: Secondary | ICD-10-CM | POA: Diagnosis not present

## 2021-09-21 DIAGNOSIS — M5136 Other intervertebral disc degeneration, lumbar region: Secondary | ICD-10-CM | POA: Diagnosis not present

## 2021-09-21 DIAGNOSIS — M9903 Segmental and somatic dysfunction of lumbar region: Secondary | ICD-10-CM | POA: Diagnosis not present

## 2021-09-21 DIAGNOSIS — M9905 Segmental and somatic dysfunction of pelvic region: Secondary | ICD-10-CM | POA: Diagnosis not present

## 2021-09-21 DIAGNOSIS — M5432 Sciatica, left side: Secondary | ICD-10-CM | POA: Diagnosis not present

## 2021-09-22 ENCOUNTER — Ambulatory Visit (INDEPENDENT_AMBULATORY_CARE_PROVIDER_SITE_OTHER): Payer: Medicare Other | Admitting: Physician Assistant

## 2021-09-22 ENCOUNTER — Encounter: Payer: Self-pay | Admitting: Physician Assistant

## 2021-09-22 VITALS — BP 137/65 | HR 81 | Temp 98.2°F | Wt 219.5 lb

## 2021-09-22 DIAGNOSIS — L0291 Cutaneous abscess, unspecified: Secondary | ICD-10-CM

## 2021-09-22 MED ORDER — DOXYCYCLINE HYCLATE 100 MG PO TABS: 100.0000 mg | ORAL_TABLET | Freq: Two times a day (BID) | ORAL | 0 refills | Status: DC

## 2021-09-22 NOTE — Progress Notes (Unsigned)
Acute Office Visit   Patient: Joshua [name]   DOB: 1950-03-24   71 y.o. Male  MRN: [id number] Visit Date: 09/22/2021  Today's healthcare provider: Dani Gobble Brilee Port, PA-C  Introduced myself to the patient as a Journalist, newspaper and provided education on APPs in clinical practice.    No chief complaint on file.  Subjective    HPI   ABSCESS Duration: days Location: left lower back  Pain:  yes Quality:  burning, sore, and tender Severity: 6/10 Redness:  yes Swelling:  yes Warmth:  yes Oozing:  no Pus:  no Treatments attempted:none Past similar infections:  yes Past MRSA skin infections:  Unsure  History of trauma in area:  no Fevers:  no Nausea/vomiting:  no  Medications: Outpatient Medications Prior to Visit  Medication Sig   acetaminophen (TYLENOL) 500 MG tablet Take 500 mg by mouth every 6 (six) hours as needed.   augmented betamethasone dipropionate (DIPROLENE AF) 0.05 % cream Apply topically 2 (two) times daily.   hydrocortisone 2.5 % lotion Apply topically.   ketoconazole (NIZORAL) 2 % shampoo Apply topically.   levothyroxine (SYNTHROID) 125 MCG tablet TAKE 1 TABLET EVERY DAY   losartan (COZAAR) 100 MG tablet Take 1 tablet (100 mg total) by mouth daily.   Multiple Vitamins-Minerals (EYE VITAMINS PO) Take by mouth.   No facility-administered medications prior to visit.    Review of Systems  Skin:        Cyst/abscess of lower back     {Labs  Heme  Chem  Endocrine  Serology  Results Review (optional):23779}   Objective    BP 137/65   Pulse 81   Temp 98.2 F (36.8 C) (Oral)   Wt 219 lb 8 oz (99.6 kg)   SpO2 98%   BMI 35.43 kg/m  {Show previous vital signs (optional):23777}  Physical Exam Vitals reviewed.  Constitutional:      General: He is awake.     Appearance: Normal appearance. He is well-developed, well-groomed and overweight.  HENT:     Head: Normocephalic and atraumatic.  Skin:    General: Skin is warm.     Findings: Abscess present.           Comments: Abscess of left lower back  Fluctuance present with induration towards the edges   Neurological:     Mental Status: He is alert.  Psychiatric:        Behavior: Behavior is cooperative.        Skin Procedure  Procedure: informed consent given.  Sterile prep of the area.  Area infiltrated with lidocaine {Blank single:19197::"with", "without"} epinephrine. Area was prepped and draped in semi-sterile fashion. Abscess was incised with no***  blade and purulent material was removed.  Diagnosis: Inflamed cyst/ abscess  Lesion Location/Size: Provider: Talitha Givens, MHS, PA-C Consent:  Risks, benefits, and alternative treatments discussed and all questions were answered.  Patient elected to proceed and verbal consent obtained.  Description: Area prepped and draped using semi-sterile technique. Area locally anesthetized using *** cc's of {Blank single:19197::"lidocaine 1% plain", "lidocaine 2% plain", "lidocaine 1% with epi", "lidocaine 2% with epi", "marcaine 0.5% plain"}.   Adequate hemostastis achieved using {Blank single:19197::"electrocautery", "Drysol", "Silver Nitrate"}  Cyst cavity entered and {Blank single:19197::"mild", "moderate", "copious"} amount of {Blank single:19197::"cheesy", "purulent", "serosanguinous"} material expressed.  Cyst wall was removed {Blank single:19197::"in tact", "in pieces"} using mosquito hemostat.",   Wound dressed after application of bacitracin ointment.   Post Procedure Instructions: Wound care  instructions discussed and patient was instructed to keep area clean and dry.  Signs and symptoms of infection discussed, patient agrees to contact the office ASAP should they occur.  Dressing change recommended twice per day.   No results found for any visits on 09/22/21.  Assessment & Plan      No follow-ups on file.

## 2021-09-22 NOTE — Patient Instructions (Signed)
Keep the dressing we placed on there for the rest of the night You can change it in the morning  Wash with warm water and regular soap - no scrubbing or harsh cleansers  Change the dressing twice per day and you can place a small amount of Vaseline or Aquaphor over the top I left it open so it can continue to drain You can place a warm compress over it for 20 minutes at a time to help encourage drainage If you notice redness, swelling, warmth around the area please let us know Make sure you finish the full course of antibiotic to help make sure the abscess does not get infected further.  Let us know if you have any questions or concerns.

## 2021-09-25 ENCOUNTER — Ambulatory Visit (INDEPENDENT_AMBULATORY_CARE_PROVIDER_SITE_OTHER): Payer: Medicare Other | Admitting: Nurse Practitioner

## 2021-09-25 ENCOUNTER — Encounter: Payer: Self-pay | Admitting: Nurse Practitioner

## 2021-09-25 VITALS — BP 120/87 | HR 77 | Temp 98.4°F | Wt 220.8 lb

## 2021-09-25 DIAGNOSIS — L0291 Cutaneous abscess, unspecified: Secondary | ICD-10-CM

## 2021-09-25 MED ORDER — SULFAMETHOXAZOLE-TRIMETHOPRIM 800-160 MG PO TABS: 1.0000 | ORAL_TABLET | Freq: Two times a day (BID) | ORAL | 0 refills | Status: DC

## 2021-09-25 NOTE — Progress Notes (Signed)
BP 120/87   Pulse 77   Temp 98.4 F (36.9 C) (Oral)   Wt 220 lb 12.8 oz (100.2 kg)   SpO2 97%   BMI 35.64 kg/m    Subjective:    Patient ID: Joshua [name], male    DOB: Jan 18, 1951, 71 y.o.   MRN: [id number]  HPI: Joshua [name] is a 71 y.o. male  Chief Complaint  Patient presents with   Cyst    Redness and soreness where he had a cyst removal per patient    Patient presents to clinic with complaints of redness and soreness at the site of his cyst removal from his back 2 days ago.  Denies fever but did have some drainage on his bandaid. Worsening pain and redness.    Relevant past medical, surgical, family and social history reviewed and updated as indicated. Interim medical history since our last visit reviewed. Allergies and medications reviewed and updated.  Review of Systems  Constitutional:  Negative for fever.  Skin:        Worsening pain and redness around incision     Per HPI unless specifically indicated above     Objective:    BP 120/87   Pulse 77   Temp 98.4 F (36.9 C) (Oral)   Wt 220 lb 12.8 oz (100.2 kg)   SpO2 97%   BMI 35.64 kg/m   Wt Readings from Last 3 Encounters:  09/25/21 220 lb 12.8 oz (100.2 kg)  09/22/21 219 lb 8 oz (99.6 kg)  08/31/21 219 lb 9.6 oz (99.6 kg)    Physical Exam Vitals and nursing note reviewed.  Constitutional:      General: He is not in acute distress.    Appearance: Normal appearance. He is not ill-appearing, toxic-appearing or diaphoretic.  HENT:     Head: Normocephalic.     Right Ear: External ear normal.     Left Ear: External ear normal.     Nose: Nose normal. No congestion or rhinorrhea.     Mouth/Throat:     Mouth: Mucous membranes are moist.  Eyes:     General:        Right eye: No discharge.        Left eye: No discharge.     Extraocular Movements: Extraocular movements intact.     Conjunctiva/sclera: Conjunctivae normal.     Pupils: Pupils are equal, round, and reactive to light.   Cardiovascular:     Rate and Rhythm: Normal rate and regular rhythm.     Heart sounds: No murmur heard. Pulmonary:     Effort: Pulmonary effort is normal. No respiratory distress.     Breath sounds: Normal breath sounds. No wheezing, rhonchi or rales.  Abdominal:     General: Abdomen is flat. Bowel sounds are normal.  Musculoskeletal:     Cervical back: Normal range of motion and neck supple.  Skin:    General: Skin is warm and dry.     Capillary Refill: Capillary refill takes less than 2 seconds.       Neurological:     General: No focal deficit present.     Mental Status: He is alert and oriented to person, place, and time.  Psychiatric:        Mood and Affect: Mood normal.        Behavior: Behavior normal.        Thought Content: Thought content normal.        Judgment: Judgment normal.  Results for orders placed or performed during the hospital encounter of 01/06/21  Surgical pathology  Result Value Ref Range   SURGICAL PATHOLOGY      SURGICAL PATHOLOGY CASE: [id number] PATIENT: [name] Surgical Pathology Report     Specimen Submitted: A. Colon polyp x2, ascending; cold snare B. Colon, descending, polypectomy site; c snare  Clinical History: History of colon polyps Z86.010.  Diverticulosis; polyps    DIAGNOSIS: A. COLON POLYPS X2, ASCENDING; COLD SNARE: - SINGLE FRAGMENT OF TUBULAR ADENOMA. - MULTIPLE FRAGMENTS OF BENIGN COLONIC MUCOSA WITH SUPERFICIAL REACTIVE CHANGES. - NEGATIVE FOR HIGH-GRADE DYSPLASIA AND MALIGNANCY.  B. COLON, DESCENDING, POLYPECTOMY SITE; COLD SNARE: - SINGLE FRAGMENT OF TUBULAR ADENOMA. - MULTIPLE FRAGMENTS OF BENIGN COLONIC MUCOSA WITH SUPERFICIAL REACTIVE CHANGES AND SMALL LYMPHOID AGGREGATES. - NEGATIVE FOR HIGH-GRADE DYSPLASIA AND MALIGNANCY.  GROSS DESCRIPTION: A. Labeled: Cold snare ascending colon polyp x2 Received: Formalin Collection time: 9:08 AM on 01/06/2021 Placed into formalin time: 9:08 AM on  01/06/2021 Tissue fragment(s): Multiple Si ze: Aggregate, 0.9 x 0.4 x 0.2 cm Description: Received are fragments of white-pink soft tissue admixed with a scant amount of intestinal debris.  The ratio of soft tissue to intestinal debris is 90: 10.  The larger soft tissue fragment has a resection margin which is inked green.  This fragment is trisected. Entirely submitted in cassettes 1-2 with the trisected fragment in cassette 1 and the remaining fragments in cassette 2.  B. Labeled: Cold snare descending colon polypectomy site Received: Formalin Collection time: 9:11 AM on 01/06/2021 Placed into formalin time: 9:11 AM on 01/06/2021 Tissue fragment(s): Multiple Size: Aggregate, 1.5 x 0.6 x 0.3 cm Description: Received are fragments of tan-white soft tissue admixed with intestinal debris.  The ratio of soft tissue to intestinal debris is 80: 20.  The largest soft tissue fragment has a resection margin which is inked blue.  This fragment is trisected. Entirely submitted in cassettes 1-2 with the trisected fragment  in cassette 1 and the remaining fragments in cassette 2.  RB 01/06/2021  Final Diagnosis performed by Allena Napoleon, MD.   Electronically signed 01/07/2021 9:26:34AM The electronic signature indicates that the named Attending Pathologist has evaluated the specimen Technical component performed at [facility], [address] [place], [place] [address] Lab: [phone] Dir: Rush Farmer, MD, MMM  Professional component performed at [facility], [facility], [place], [place], [place] [address] Lab: [phone] Dir: Kathi Simpers, MD       Assessment & Plan:   Problem List Items Addressed This Visit   None Visit Diagnoses     Abscess    -  Primary   Appears infected on exam with worsening redness and warmth. Will change from doxycyline to bactrim.  Follow up on Monday for reevaluation.          Follow up plan: Return in about 2 days (around  09/27/2021).

## 2021-09-28 ENCOUNTER — Ambulatory Visit (INDEPENDENT_AMBULATORY_CARE_PROVIDER_SITE_OTHER): Payer: Medicare Other | Admitting: Nurse Practitioner

## 2021-09-28 ENCOUNTER — Encounter: Payer: Self-pay | Admitting: Nurse Practitioner

## 2021-09-28 VITALS — BP 116/83 | HR 73 | Temp 97.8°F | Wt 217.8 lb

## 2021-09-28 DIAGNOSIS — L0291 Cutaneous abscess, unspecified: Secondary | ICD-10-CM

## 2021-09-28 DIAGNOSIS — M9905 Segmental and somatic dysfunction of pelvic region: Secondary | ICD-10-CM | POA: Diagnosis not present

## 2021-09-28 DIAGNOSIS — M5136 Other intervertebral disc degeneration, lumbar region: Secondary | ICD-10-CM | POA: Diagnosis not present

## 2021-09-28 DIAGNOSIS — M9903 Segmental and somatic dysfunction of lumbar region: Secondary | ICD-10-CM | POA: Diagnosis not present

## 2021-09-28 DIAGNOSIS — M5432 Sciatica, left side: Secondary | ICD-10-CM | POA: Diagnosis not present

## 2021-09-28 NOTE — Progress Notes (Signed)
BP 116/83   Pulse 73   Temp 97.8 F (36.6 C) (Oral)   Wt 217 lb 12.8 oz (98.8 kg)   SpO2 97%   BMI 35.15 kg/m    Subjective:    Patient ID: Joshua [name], male    DOB: 1950-02-07, 71 y.o.   MRN: [id number]  HPI: Joshua [name] is a 71 y.o. male  Chief Complaint  Patient presents with   Abscess    Follow up on abscess. Patient reports abx is helping a lot    09/25/2021-Patient presents to clinic with complaints of redness and soreness at the site of his cyst removal from his back 2 days ago.  Denies fever but did have some drainage on his bandaid. Worsening pain and redness.   09/28/21-Patient states he is doing better today.  He ius taking the Bactrim vs the Doxycyline.  Complains of less pain, less redness, and no additional redness.   Relevant past medical, surgical, family and social history reviewed and updated as indicated. Interim medical history since our last visit reviewed. Allergies and medications reviewed and updated.  Review of Systems  Constitutional:  Negative for fever.  Skin:        Less pain and redness around incision     Per HPI unless specifically indicated above     Objective:    BP 116/83   Pulse 73   Temp 97.8 F (36.6 C) (Oral)   Wt 217 lb 12.8 oz (98.8 kg)   SpO2 97%   BMI 35.15 kg/m   Wt Readings from Last 3 Encounters:  09/28/21 217 lb 12.8 oz (98.8 kg)  09/25/21 220 lb 12.8 oz (100.2 kg)  09/22/21 219 lb 8 oz (99.6 kg)    Physical Exam Vitals and nursing note reviewed.  Constitutional:      General: He is not in acute distress.    Appearance: Normal appearance. He is not ill-appearing, toxic-appearing or diaphoretic.  HENT:     Head: Normocephalic.     Right Ear: External ear normal.     Left Ear: External ear normal.     Nose: Nose normal. No congestion or rhinorrhea.     Mouth/Throat:     Mouth: Mucous membranes are moist.  Eyes:     General:        Right eye: No discharge.        Left eye: No discharge.      Extraocular Movements: Extraocular movements intact.     Conjunctiva/sclera: Conjunctivae normal.     Pupils: Pupils are equal, round, and reactive to light.  Cardiovascular:     Rate and Rhythm: Normal rate and regular rhythm.     Heart sounds: No murmur heard. Pulmonary:     Effort: Pulmonary effort is normal. No respiratory distress.     Breath sounds: Normal breath sounds. No wheezing, rhonchi or rales.  Abdominal:     General: Abdomen is flat. Bowel sounds are normal.  Musculoskeletal:     Cervical back: Normal range of motion and neck supple.  Skin:    General: Skin is warm and dry.     Capillary Refill: Capillary refill takes less than 2 seconds.       Neurological:     General: No focal deficit present.     Mental Status: He is alert and oriented to person, place, and time.  Psychiatric:        Mood and Affect: Mood normal.  Behavior: Behavior normal.        Thought Content: Thought content normal.        Judgment: Judgment normal.     Results for orders placed or performed during the hospital encounter of 01/06/21  Surgical pathology  Result Value Ref Range   SURGICAL PATHOLOGY      SURGICAL PATHOLOGY CASE: [id number] PATIENT: [name] Surgical Pathology Report     Specimen Submitted: A. Colon polyp x2, ascending; cold snare B. Colon, descending, polypectomy site; c snare  Clinical History: History of colon polyps Z86.010.  Diverticulosis; polyps    DIAGNOSIS: A. COLON POLYPS X2, ASCENDING; COLD SNARE: - SINGLE FRAGMENT OF TUBULAR ADENOMA. - MULTIPLE FRAGMENTS OF BENIGN COLONIC MUCOSA WITH SUPERFICIAL REACTIVE CHANGES. - NEGATIVE FOR HIGH-GRADE DYSPLASIA AND MALIGNANCY.  B. COLON, DESCENDING, POLYPECTOMY SITE; COLD SNARE: - SINGLE FRAGMENT OF TUBULAR ADENOMA. - MULTIPLE FRAGMENTS OF BENIGN COLONIC MUCOSA WITH SUPERFICIAL REACTIVE CHANGES AND SMALL LYMPHOID AGGREGATES. - NEGATIVE FOR HIGH-GRADE DYSPLASIA AND MALIGNANCY.  GROSS  DESCRIPTION: A. Labeled: Cold snare ascending colon polyp x2 Received: Formalin Collection time: 9:08 AM on 01/06/2021 Placed into formalin time: 9:08 AM on 01/06/2021 Tissue fragment(s): Multiple Si ze: Aggregate, 0.9 x 0.4 x 0.2 cm Description: Received are fragments of white-pink soft tissue admixed with a scant amount of intestinal debris.  The ratio of soft tissue to intestinal debris is 90: 10.  The larger soft tissue fragment has a resection margin which is inked green.  This fragment is trisected. Entirely submitted in cassettes 1-2 with the trisected fragment in cassette 1 and the remaining fragments in cassette 2.  B. Labeled: Cold snare descending colon polypectomy site Received: Formalin Collection time: 9:11 AM on 01/06/2021 Placed into formalin time: 9:11 AM on 01/06/2021 Tissue fragment(s): Multiple Size: Aggregate, 1.5 x 0.6 x 0.3 cm Description: Received are fragments of tan-white soft tissue admixed with intestinal debris.  The ratio of soft tissue to intestinal debris is 80: 20.  The largest soft tissue fragment has a resection margin which is inked blue.  This fragment is trisected. Entirely submitted in cassettes 1-2 with the trisected fragment  in cassette 1 and the remaining fragments in cassette 2.  RB 01/06/2021  Final Diagnosis performed by Allena Napoleon, MD.   Electronically signed 01/07/2021 9:26:34AM The electronic signature indicates that the named Attending Pathologist has evaluated the specimen Technical component performed at [facility], [address] [place], [place] [address] Lab: [phone] Dir: Rush Farmer, MD, MMM  Professional component performed at [facility], [facility], [place], [place], [place] [address] Lab: [phone] Dir: Kathi Simpers, MD       Assessment & Plan:   Problem List Items Addressed This Visit   None Visit Diagnoses     Abscess    -  Primary   Improved with Bactrim. Complete course of  antibiotics.  Follow up in 2 weeks for reevaluation.  Call sooner if concerns arise.         Follow up plan: Return in about 2 weeks (around 10/12/2021) for HTN, HLD, DM2 FU.

## 2021-10-02 ENCOUNTER — Ambulatory Visit: Payer: Self-pay | Admitting: *Deleted

## 2021-10-02 NOTE — Telephone Encounter (Signed)
Pt received second bottle of Bacterin. Asked if he was supposed to take a second bottle. Explained it was first ordered from mail order pharm and because it would take too long that order was cancelled and it was ordered locally. Just take the one rx. Verbalized understanding.  Reason for Disposition  Health Information question, no triage required and triager able to answer question  Answer Assessment - Initial Assessment Questions 1. REASON FOR CALL or QUESTION: "What is your reason for calling today?" or "How can I best help you?" or "What question do you have that I can help answer?"     Received another bottle of Bacterin and was making sure that he was not supposed to take it also.  Protocols used: Information Only Call - No Triage-A-AH

## 2021-10-12 DIAGNOSIS — L728 Other follicular cysts of the skin and subcutaneous tissue: Secondary | ICD-10-CM | POA: Diagnosis not present

## 2021-10-12 DIAGNOSIS — M5136 Other intervertebral disc degeneration, lumbar region: Secondary | ICD-10-CM | POA: Diagnosis not present

## 2021-10-12 DIAGNOSIS — M5432 Sciatica, left side: Secondary | ICD-10-CM | POA: Diagnosis not present

## 2021-10-12 DIAGNOSIS — M9905 Segmental and somatic dysfunction of pelvic region: Secondary | ICD-10-CM | POA: Diagnosis not present

## 2021-10-12 DIAGNOSIS — M9903 Segmental and somatic dysfunction of lumbar region: Secondary | ICD-10-CM | POA: Diagnosis not present

## 2021-10-21 ENCOUNTER — Ambulatory Visit (INDEPENDENT_AMBULATORY_CARE_PROVIDER_SITE_OTHER): Payer: Medicare Other | Admitting: Nurse Practitioner

## 2021-10-21 ENCOUNTER — Encounter: Payer: Self-pay | Admitting: Nurse Practitioner

## 2021-10-21 VITALS — BP 135/84 | HR 64 | Temp 98.3°F | Wt 224.5 lb

## 2021-10-21 DIAGNOSIS — E538 Deficiency of other specified B group vitamins: Secondary | ICD-10-CM

## 2021-10-21 DIAGNOSIS — R7303 Prediabetes: Secondary | ICD-10-CM

## 2021-10-21 DIAGNOSIS — E039 Hypothyroidism, unspecified: Secondary | ICD-10-CM | POA: Diagnosis not present

## 2021-10-21 DIAGNOSIS — E7800 Pure hypercholesterolemia, unspecified: Secondary | ICD-10-CM | POA: Diagnosis not present

## 2021-10-21 DIAGNOSIS — I10 Essential (primary) hypertension: Secondary | ICD-10-CM | POA: Diagnosis not present

## 2021-10-21 MED ORDER — ROSUVASTATIN CALCIUM 5 MG PO TABS: 5.0000 mg | ORAL_TABLET | Freq: Every day | ORAL | 1 refills | Status: DC

## 2021-10-21 NOTE — Assessment & Plan Note (Signed)
Chronic.  Controlled.  Continue with current medication regimen of Levothyroxine.  Labs ordered today.  Return to clinic in 6 months for reevaluation.  Call sooner if concerns arise.

## 2021-10-21 NOTE — Assessment & Plan Note (Addendum)
Chronic.  Controlled.  Continue with current medication regimen of Losartan '100mg'$ .  Labs ordered today.  Return to clinic in 6 months for reevaluation.  Call sooner if concerns arise. Reviewed ASCVD with patient during visit. Will start Crestor '5mg'$  daily. Side effects and benefits of medication discussed.    The 10-year ASCVD risk score (Arnett DK, et al., 2019) is: 26.7%   Values used to calculate the score:     Age: 71 years     Sex: Male     Is Non-Hispanic African American: No     Diabetic: No     Tobacco smoker: No     Systolic Blood Pressure: 909 mmHg     Is BP treated: Yes     HDL Cholesterol: 48 mg/dL     Total Cholesterol: 180 mg/dL

## 2021-10-21 NOTE — Progress Notes (Signed)
BP 135/84   Pulse 64   Temp 98.3 F (36.8 C) (Oral)   Wt 224 lb 8 oz (101.8 kg)   SpO2 97%   BMI 36.24 kg/m    Subjective:    Patient ID: Joshua [name], male    DOB: 1950-11-27, 71 y.o.   MRN: [id number]  HPI: Joshua [name] is a 71 y.o. male  Chief Complaint  Patient presents with   Hyperlipidemia   Diabetes   Hypertension   HYPERTENSION / [place] Satisfied with current treatment? yes Duration of hypertension: years BP monitoring frequency: not checking BP range:  BP medication side effects: no Past BP meds: losartan (cozaar) Duration of hyperlipidemia: years Cholesterol medication side effects: no Cholesterol supplements: none Past cholesterol medications: none Medication compliance: excellent compliance Aspirin: no Recent stressors: no Recurrent headaches: no Visual changes: no Palpitations: no Dyspnea: no Chest pain: no Lower extremity edema: no Dizzy/lightheaded: no  HYPOTHYROIDISM Thyroid control status:controlled Satisfied with current treatment? yes Medication side effects: no Medication compliance: excellent compliance Etiology of hypothyroidism:  Recent dose adjustment:no Fatigue: no Cold intolerance: no Heat intolerance: no Weight gain: no Weight loss: no Constipation: no Diarrhea/loose stools: no Palpitations: no Lower extremity edema: no Anxiety/depressed mood: no  The 10-year ASCVD risk score (Arnett DK, et al., 2019) is: 26.7%   Values used to calculate the score:     Age: 44 years     Sex: Male     Is Non-Hispanic African American: No     Diabetic: No     Tobacco smoker: No     Systolic Blood Pressure: 810 mmHg     Is BP treated: Yes     HDL Cholesterol: 48 mg/dL     Total Cholesterol: 180 mg/dL   Relevant past medical, surgical, family and social history reviewed and updated as indicated. Interim medical history since our last visit reviewed. Allergies and medications reviewed and updated.  Review of Systems   Constitutional:  Negative for fatigue and unexpected weight change.  Eyes:  Negative for visual disturbance.  Respiratory:  Negative for chest tightness and shortness of breath.   Cardiovascular:  Negative for chest pain, palpitations and leg swelling.  Gastrointestinal:  Negative for constipation and diarrhea.  Endocrine: Negative for cold intolerance and heat intolerance.  Neurological:  Negative for dizziness, light-headedness and headaches.  Psychiatric/Behavioral:  Negative for dysphoric mood. The patient is not nervous/anxious.     Per HPI unless specifically indicated above     Objective:    BP 135/84   Pulse 64   Temp 98.3 F (36.8 C) (Oral)   Wt 224 lb 8 oz (101.8 kg)   SpO2 97%   BMI 36.24 kg/m   Wt Readings from Last 3 Encounters:  10/21/21 224 lb 8 oz (101.8 kg)  09/28/21 217 lb 12.8 oz (98.8 kg)  09/25/21 220 lb 12.8 oz (100.2 kg)    Physical Exam Vitals and nursing note reviewed.  Constitutional:      General: He is not in acute distress.    Appearance: Normal appearance. He is obese. He is not ill-appearing, toxic-appearing or diaphoretic.  HENT:     Head: Normocephalic.     Right Ear: External ear normal.     Left Ear: External ear normal.     Nose: Nose normal. No congestion or rhinorrhea.     Mouth/Throat:     Mouth: Mucous membranes are moist.  Eyes:     General:  Right eye: No discharge.        Left eye: No discharge.     Extraocular Movements: Extraocular movements intact.     Conjunctiva/sclera: Conjunctivae normal.     Pupils: Pupils are equal, round, and reactive to light.  Cardiovascular:     Rate and Rhythm: Normal rate and regular rhythm.     Heart sounds: No murmur heard. Pulmonary:     Effort: Pulmonary effort is normal. No respiratory distress.     Breath sounds: Normal breath sounds. No wheezing, rhonchi or rales.  Abdominal:     General: Abdomen is flat. Bowel sounds are normal.  Musculoskeletal:     Cervical back: Normal  range of motion and neck supple.  Skin:    General: Skin is warm and dry.     Capillary Refill: Capillary refill takes less than 2 seconds.  Neurological:     General: No focal deficit present.     Mental Status: He is alert and oriented to person, place, and time.  Psychiatric:        Mood and Affect: Mood normal.        Behavior: Behavior normal.        Thought Content: Thought content normal.        Judgment: Judgment normal.     Results for orders placed or performed during the hospital encounter of 01/06/21  Surgical pathology  Result Value Ref Range   SURGICAL PATHOLOGY      SURGICAL PATHOLOGY CASE: [id number] [phone] PATIENT: [name] Surgical Pathology Report     Specimen Submitted: A. Colon polyp x2, ascending; cold snare B. Colon, descending, polypectomy site; c snare  Clinical History: History of colon polyps Z86.010.  Diverticulosis; polyps    DIAGNOSIS: A. COLON POLYPS X2, ASCENDING; COLD SNARE: - SINGLE FRAGMENT OF TUBULAR ADENOMA. - MULTIPLE FRAGMENTS OF BENIGN COLONIC MUCOSA WITH SUPERFICIAL REACTIVE CHANGES. - NEGATIVE FOR HIGH-GRADE DYSPLASIA AND MALIGNANCY.  B. COLON, DESCENDING, POLYPECTOMY SITE; COLD SNARE: - SINGLE FRAGMENT OF TUBULAR ADENOMA. - MULTIPLE FRAGMENTS OF BENIGN COLONIC MUCOSA WITH SUPERFICIAL REACTIVE CHANGES AND SMALL LYMPHOID AGGREGATES. - NEGATIVE FOR HIGH-GRADE DYSPLASIA AND MALIGNANCY.  GROSS DESCRIPTION: A. Labeled: Cold snare ascending colon polyp x2 Received: Formalin Collection time: 9:08 AM on 01/06/2021 Placed into formalin time: 9:08 AM on 01/06/2021 Tissue fragment(s): Multiple Si ze: Aggregate, 0.9 x 0.4 x 0.2 cm Description: Received are fragments of white-pink soft tissue admixed with a scant amount of intestinal debris.  The ratio of soft tissue to intestinal debris is 90: 10.  The larger soft tissue fragment has a resection margin which is inked green.  This fragment is trisected. Entirely submitted in  cassettes 1-2 with the trisected fragment in cassette 1 and the remaining fragments in cassette 2.  B. Labeled: Cold snare descending colon polypectomy site Received: Formalin Collection time: 9:11 AM on 01/06/2021 Placed into formalin time: 9:11 AM on 01/06/2021 Tissue fragment(s): Multiple Size: Aggregate, 1.5 x 0.6 x 0.3 cm Description: Received are fragments of tan-white soft tissue admixed with intestinal debris.  The ratio of soft tissue to intestinal debris is 80: 20.  The largest soft tissue fragment has a resection margin which is inked blue.  This fragment is trisected. Entirely submitted in cassettes 1-2 with the trisected fragment  in cassette 1 and the remaining fragments in cassette 2.  RB 01/06/2021  Final Diagnosis performed by Allena Napoleon, MD.   Electronically signed 01/07/2021 9:26:34AM The electronic signature indicates that the named Attending Pathologist has evaluated the  specimen Technical component performed at [facility], [address], [place], [place] [address] Lab: [phone] Dir: Rush Farmer, MD, MMM  Professional component performed at [facility], [facility], [place], [place], [place] [address] Lab: [phone] Dir: Kathi Simpers, MD       Assessment & Plan:   Problem List Items Addressed This Visit       Cardiovascular and Mediastinum   Hypertension - Primary    Chronic.  Controlled.  Continue with current medication regimen of Losartan 142m.  Labs ordered today.  Return to clinic in 6 months for reevaluation.  Call sooner if concerns arise. Reviewed ASCVD with patient during visit. Will start Crestor 555mdaily. Side effects and benefits of medication discussed.    The 10-year ASCVD risk score (Arnett DK, et al., 2019) is: 26.7%   Values used to calculate the score:     Age: [age]     Sex: Male     Is Non-Hispanic African American: No     Diabetic: No     Tobacco smoker: No     Systolic Blood Pressure: 13013mHg      Is BP treated: Yes     HDL Cholesterol: 48 mg/dL     Total Cholesterol: 180 mg/dL       Relevant Medications   rosuvastatin (CRESTOR) 5 MG tablet   Other Relevant Orders   Comp Met (CMET)     Endocrine   Hypothyroidism    Chronic.  Controlled.  Continue with current medication regimen of Levothyroxine.  Labs ordered today.  Return to clinic in 6 months for reevaluation.  Call sooner if concerns arise.        Relevant Orders   TSH   T4, free     Other   B12 deficiency   Relevant Orders   B12   Prediabetes   Relevant Orders   HgB A1c   Other Visit Diagnoses     Elevated LDL cholesterol level       Relevant Orders   Lipid Profile        Follow up plan: Return in about 6 months (around 04/21/2022) for HTN, HLD, DM2 FU.

## 2021-10-22 LAB — COMPREHENSIVE METABOLIC PANEL
ALT: 15 IU/L (ref 0–44)
AST: 21 IU/L (ref 0–40)
Albumin/Globulin Ratio: 1.9 (ref 1.2–2.2)
Albumin: 4.2 g/dL (ref 3.8–4.8)
Alkaline Phosphatase: 74 IU/L (ref 44–121)
BUN/Creatinine Ratio: 15 (ref 10–24)
BUN: 13 mg/dL (ref 8–27)
Bilirubin Total: 0.6 mg/dL (ref 0.0–1.2)
CO2: 24 mmol/L (ref 20–29)
Calcium: 9.3 mg/dL (ref 8.6–10.2)
Chloride: 104 mmol/L (ref 96–106)
Creatinine, Ser: 0.86 mg/dL (ref 0.76–1.27)
Globulin, Total: 2.2 g/dL (ref 1.5–4.5)
Glucose: 79 mg/dL (ref 70–99)
Potassium: 3.9 mmol/L (ref 3.5–5.2)
Sodium: 141 mmol/L (ref 134–144)
Total Protein: 6.4 g/dL (ref 6.0–8.5)
eGFR: 91 mL/min/{1.73_m2} (ref >59)

## 2021-10-22 LAB — LIPID PANEL
Chol/HDL Ratio: 3.7 ratio (ref 0.0–5.0)
Cholesterol, Total: 176 mg/dL (ref 100–199)
HDL: 47 mg/dL (ref >39)
LDL Chol Calc (NIH): 106 mg/dL — ABNORMAL HIGH (ref 0–99)
Triglycerides: 131 mg/dL (ref 0–149)
VLDL Cholesterol Cal: 23 mg/dL (ref 5–40)

## 2021-10-22 LAB — T4, FREE: Free T4: 1.3 ng/dL (ref 0.82–1.77)

## 2021-10-22 LAB — VITAMIN B12: Vitamin B-12: 198 pg/mL — ABNORMAL LOW (ref 232–1245)

## 2021-10-22 LAB — TSH: TSH: 1.98 u[IU]/mL (ref 0.450–4.500)

## 2021-10-22 LAB — HEMOGLOBIN A1C
Est. average glucose Bld gHb Est-mCnc: 123 mg/dL
Hgb A1c MFr Bld: 5.9 % — ABNORMAL HIGH (ref 4.8–5.6)

## 2021-10-22 NOTE — Progress Notes (Signed)
Please let patient know that his lab work shows that his cholesterol is slightly elevated.  His A1c is well controlled at 5.9.  His B12 is low.  I recommend B12 1074mg daily to help with this.  No other concerns at this time.  Continue with current medication regimen.  Follow up as discussed.

## 2021-10-29 ENCOUNTER — Ambulatory Visit: Payer: Self-pay

## 2021-10-29 NOTE — Telephone Encounter (Signed)
Message from Scherrie Gerlach sent at 10/29/2021  2:05 PM EDT  Summary: med reaction   Pt states he is not going to take the rosuvastatin (CRESTOR) 5 MG tablet.  He thinks it made him feel unbalanced, just generally off, feeling of falling from time to time. Pt only 2 tabs          Chief Complaint: unbalanced off balanced with walking yesterday- stopped statin Symptoms: pt stated this morning he was stumbling and walking to the right, feels off Frequency: 10/30/21 Pertinent Negatives: Patient denies weakness or numbness to face, arms or legs, no vision changes,no slurred speech, smile is equal per wife; no dizziness or vertigo Disposition: '[]'$ ED /'[]'$ Urgent Care (no appt availability in office) / '[]'$ Appointment(In office/virtual)/ '[]'$  [facility]/ '[]'$ Home Care/ '[x]'$ Refused Recommended Disposition /'[]'$ [facility]/ '[]'$  Follow-up with PCP Additional Notes: Pt refused appt. Advised pt these sx can be form other things other than what he thinks the sx are coming from. Advised pt he needs to be examined. Pt refused again. Advised pt to call 911 if having numbness or tingling of face or arms or legs, if he is still stumbling and feeling "off balanced" after stopping the statin. Pt verbalized understanding.  Answer Assessment - Initial Assessment Questions 1. SYMPTOM: "What is the main symptom you are concerned about?" (e.g., weakness, numbness)     Off balance, falling to right 2. ONSET: "When did this start?" (minutes, hours, days; while sleeping)     10/30/21 3. LAST NORMAL: "When was the last time you (the patient) were normal (no symptoms)?"     *No Answer* 4. PATTERN "Does this come and go, or has it been constant since it started?"  "Is it present now?"     *No Answer* 5. CARDIAC SYMPTOMS: "Have you had any of the following symptoms: chest pain, difficulty breathing, palpitations?"     no 6. NEUROLOGIC SYMPTOMS: "Have you had any of the following symptoms: headache, dizziness,  vision loss, double vision, changes in speech, unsteady on your feet?"     Headache yesterday- no headache-unsteadyness 7. OTHER SYMPTOMS: "Do you have any other symptoms?"     no 8. PREGNANCY: "Is there any chance you are pregnant?" "When was your last menstrual period?"     *No Answer*  Protocols used: Neurologic Deficit-A-AH

## 2021-11-02 ENCOUNTER — Ambulatory Visit (INDEPENDENT_AMBULATORY_CARE_PROVIDER_SITE_OTHER): Payer: Medicare Other | Admitting: *Deleted

## 2021-11-02 DIAGNOSIS — Z1211 Encounter for screening for malignant neoplasm of colon: Secondary | ICD-10-CM | POA: Diagnosis not present

## 2021-11-02 DIAGNOSIS — Z0000 Encounter for general adult medical examination without abnormal findings: Secondary | ICD-10-CM

## 2021-11-02 NOTE — Patient Instructions (Signed)
[name] , Thank you for taking time to come for your Medicare Wellness Visit. I appreciate your ongoing commitment to your health goals. Please review the following plan we discussed and let me know if I can assist you in the future.   Screening recommendations/referrals: Colonoscopy: Education provided Recommended yearly ophthalmology/optometry visit for glaucoma screening and checkup Recommended yearly dental visit for hygiene and checkup  Vaccinations: Influenza vaccine: Education provided Pneumococcal vaccine: up to date Tdap vaccine: up to date Shingles vaccine: Education provided    Advanced directives: Education provided  Conditions/risks identified:   Next appointment: 04-20-2021 @ 1:20 [facility] 25 Years and Older, Male Preventive care refers to lifestyle choices and visits with your health care provider that can promote health and wellness. What does preventive care include? A yearly physical exam. This is also called an annual well check. Dental exams once or twice a year. Routine eye exams. Ask your health care provider how often you should have your eyes checked. Personal lifestyle choices, including: Daily care of your teeth and gums. Regular physical activity. Eating a healthy diet. Avoiding tobacco and drug use. Limiting alcohol use. Practicing safe sex. Taking low doses of aspirin every day. Taking vitamin and mineral supplements as recommended by your health care provider. What happens during an annual well check? The services and screenings done by your health care provider during your annual well check will depend on your age, overall health, lifestyle risk factors, and family history of disease. Counseling  Your health care provider may ask you questions about your: Alcohol use. Tobacco use. Drug use. Emotional well-being. Home and relationship well-being. Sexual activity. Eating habits. History of falls. Memory and ability to  understand (cognition). Work and work Statistician. Screening  You may have the following tests or measurements: Height, weight, and BMI. Blood pressure. Lipid and cholesterol levels. These may be checked every 5 years, or more frequently if you are over 51 years old. Skin check. Lung cancer screening. You may have this screening every year starting at age 10 if you have a 30-pack-year history of smoking and currently smoke or have quit within the past 15 years. Fecal occult blood test (FOBT) of the stool. You may have this test every year starting at age 77. Flexible sigmoidoscopy or colonoscopy. You may have a sigmoidoscopy every 5 years or a colonoscopy every 10 years starting at age 17. Prostate cancer screening. Recommendations will vary depending on your family history and other risks. Hepatitis C blood test. Hepatitis B blood test. Sexually transmitted disease (STD) testing. Diabetes screening. This is done by checking your blood sugar (glucose) after you have not eaten for a while (fasting). You may have this done every 1-3 years. Abdominal aortic aneurysm (AAA) screening. You may need this if you are a current or former smoker. Osteoporosis. You may be screened starting at age 47 if you are at high risk. Talk with your health care provider about your test results, treatment options, and if necessary, the need for more tests. Vaccines  Your health care provider may recommend certain vaccines, such as: Influenza vaccine. This is recommended every year. Tetanus, diphtheria, and acellular pertussis (Tdap, Td) vaccine. You may need a Td booster every 10 years. Zoster vaccine. You may need this after age 38. Pneumococcal 13-valent conjugate (PCV13) vaccine. One dose is recommended after age 56. Pneumococcal polysaccharide (PPSV23) vaccine. One dose is recommended after age 29. Talk to your health care provider about which screenings and vaccines you need  and how often you need them. This  information is not intended to replace advice given to you by your health care provider. Make sure you discuss any questions you have with your health care provider. Document Released: 02/14/2015 Document Revised: 10/08/2015 Document Reviewed: 11/19/2014 Elsevier Interactive Patient Education  2017 [place] Prevention in the Home Falls can cause injuries. They can happen to people of all ages. There are many things you can do to make your home safe and to help prevent falls. What can I do on the outside of my home? Regularly fix the edges of walkways and driveways and fix any cracks. Remove anything that might make you trip as you walk through a door, such as a raised step or threshold. Trim any bushes or trees on the path to your home. Use bright outdoor lighting. Clear any walking paths of anything that might make someone trip, such as rocks or tools. Regularly check to see if handrails are loose or broken. Make sure that both sides of any steps have handrails. Any raised decks and porches should have guardrails on the edges. Have any leaves, snow, or ice cleared regularly. Use sand or salt on walking paths during winter. Clean up any spills in your garage right away. This includes oil or grease spills. What can I do in the bathroom? Use night lights. Install grab bars by the toilet and in the tub and shower. Do not use towel bars as grab bars. Use non-skid mats or decals in the tub or shower. If you need to sit down in the shower, use a plastic, non-slip stool. Keep the floor dry. Clean up any water that spills on the floor as soon as it happens. Remove soap buildup in the tub or shower regularly. Attach bath mats securely with double-sided non-slip rug tape. Do not have throw rugs and other things on the floor that can make you trip. What can I do in the bedroom? Use night lights. Make sure that you have a light by your bed that is easy to reach. Do not use any sheets or  blankets that are too big for your bed. They should not hang down onto the floor. Have a firm chair that has side arms. You can use this for support while you get dressed. Do not have throw rugs and other things on the floor that can make you trip. What can I do in the kitchen? Clean up any spills right away. Avoid walking on wet floors. Keep items that you use a lot in easy-to-reach places. If you need to reach something above you, use a strong step stool that has a grab bar. Keep electrical cords out of the way. Do not use floor polish or wax that makes floors slippery. If you must use wax, use non-skid floor wax. Do not have throw rugs and other things on the floor that can make you trip. What can I do with my stairs? Do not leave any items on the stairs. Make sure that there are handrails on both sides of the stairs and use them. Fix handrails that are broken or loose. Make sure that handrails are as long as the stairways. Check any carpeting to make sure that it is firmly attached to the stairs. Fix any carpet that is loose or worn. Avoid having throw rugs at the top or bottom of the stairs. If you do have throw rugs, attach them to the floor with carpet tape. Make sure that you  have a light switch at the top of the stairs and the bottom of the stairs. If you do not have them, ask someone to add them for you. What else can I do to help prevent falls? Wear shoes that: Do not have high heels. Have rubber bottoms. Are comfortable and fit you well. Are closed at the toe. Do not wear sandals. If you use a stepladder: Make sure that it is fully opened. Do not climb a closed stepladder. Make sure that both sides of the stepladder are locked into place. Ask someone to hold it for you, if possible. Clearly mark and make sure that you can see: Any grab bars or handrails. First and last steps. Where the edge of each step is. Use tools that help you move around (mobility aids) if they are  needed. These include: Canes. Walkers. Scooters. Crutches. Turn on the lights when you go into a dark area. Replace any light bulbs as soon as they burn out. Set up your furniture so you have a clear path. Avoid moving your furniture around. If any of your floors are uneven, fix them. If there are any pets around you, be aware of where they are. Review your medicines with your doctor. Some medicines can make you feel dizzy. This can increase your chance of falling. Ask your doctor what other things that you can do to help prevent falls. This information is not intended to replace advice given to you by your health care provider. Make sure you discuss any questions you have with your health care provider. Document Released: 11/14/2008 Document Revised: 06/26/2015 Document Reviewed: 02/22/2014 Elsevier Interactive Patient Education  2017 [employer].

## 2021-11-02 NOTE — Progress Notes (Signed)
Subjective:   Joshua [name] is a 71 y.o. male who presents for Medicare Annual/Subsequent preventive examination.  I connected with  [name] on 11/02/21 by a telephone enabled telemedicine application and verified that I am speaking with the correct person using two identifiers.   I discussed the limitations of evaluation and management by telemedicine. The patient expressed understanding and agreed to proceed.  Patient location: home  Provider location: tele-health-home  .   Review of Systems     Cardiac Risk Factors include: advanced age (>76mn, >>3women);family history of premature cardiovascular disease;hypertension;male gender;obesity (BMI >30kg/m2)     Objective:    Today's Vitals   11/02/21 1039  PainSc: 4    There is no height or weight on file to calculate BMI.     01/06/2021    8:31 AM 10/31/2020   10:36 AM 05/29/2020    8:24 AM 07/09/2019   10:44 AM 06/28/2018    3:21 PM 06/23/2017    4:04 PM  Advanced Directives  Does Patient Have a Medical Advance Directive? No No No No No No  Does patient want to make changes to medical advance directive?      Yes (MAU/Ambulatory/Procedural Areas - Information given)  Would patient like information on creating a medical advance directive?   No - Patient declined Yes (Inpatient - patient defers creating a medical advance directive at this time - Information given) Yes (MAU/Ambulatory/Procedural Areas - Information given)     Current Medications (verified) Outpatient Encounter Medications as of 11/02/2021  Medication Sig   cetirizine (ZYRTEC) 10 MG chewable tablet Chew 10 mg by mouth daily. Take once a day   acetaminophen (TYLENOL) 500 MG tablet Take 500 mg by mouth every 6 (six) hours as needed.   augmented betamethasone dipropionate (DIPROLENE AF) 0.05 % cream Apply topically 2 (two) times daily.   hydrocortisone 2.5 % lotion Apply topically.   ketoconazole (NIZORAL) 2 % shampoo Apply topically.   levothyroxine  (SYNTHROID) 125 MCG tablet TAKE 1 TABLET EVERY DAY   losartan (COZAAR) 100 MG tablet Take 1 tablet (100 mg total) by mouth daily.   Multiple Vitamins-Minerals (EYE VITAMINS PO) Take by mouth.   rosuvastatin (CRESTOR) 5 MG tablet Take 1 tablet (5 mg total) by mouth daily.   No facility-administered encounter medications on file as of 11/02/2021.    Allergies (verified) Aspirin, Celery oil, Flomax [tamsulosin hcl], Hydromorphone, Oysters [shellfish allergy], Amlodipine besylate, and Latex   History: Past Medical History:  Diagnosis Date   Allergy    Amnesia 06/13/2014   B12 deficiency 06/13/2014   Benign essential HTN 10/30/2014   BPH (benign prostatic hyperplasia)    BPH with obstruction/lower urinary tract symptoms 05/15/2015   History of kidney stones    Hypogonadism in male    Hypothyroidism 10/30/2014   Past Surgical History:  Procedure Laterality Date   COLONOSCOPY  March 2016   COLONOSCOPY WITH PROPOFOL N/A 05/29/2020   Procedure: COLONOSCOPY WITH PROPOFOL;  Surgeon: AJonathon Bellows MD;  [facility]  Service: Gastroenterology;  Laterality: N/A;   COLONOSCOPY WITH PROPOFOL N/A 01/06/2021   Procedure: COLONOSCOPY WITH PROPOFOL;  Surgeon: AJonathon Bellows MD;  [facility];  Service: Gastro[facility]ology;  Laterality: N/A;   ESOPHAGOGASTRODUODENOSCOPY ENDOSCOPY  March 2016   EYE SURGERY Bilateral    cataracts   KNEE SURGERY Right    SHOULDER SURGERY Left    TONSILLECTOMY     Family History  Problem Relation Age of Onset   Breast cancer  Mother    COPD Father    Heart disease Father    Prostate cancer Father    Social History   Socioeconomic History   Marital status: Married    Spouse name: Not on file   Number of children: Not on file   Years of education: Not on file   Highest education level: High school graduate  Occupational History   Not on file  Tobacco Use   Smoking status: Never   Smokeless tobacco: Never  Vaping Use   Vaping Use: Never used   Substance and Sexual Activity   Alcohol use: Yes    Alcohol/week: 0.0 standard drinks of alcohol    Comment: Rarely   Drug use: No   Sexual activity: Not Currently  Other Topics Concern   Not on file  Social History Narrative   Not on file   Social Determinants of Health   Financial Resource Strain: Low Risk  (11/02/2021)   Overall Financial Resource Strain (CARDIA)    Difficulty of Paying Living Expenses: Not hard at all  Food Insecurity: No Food Insecurity (11/02/2021)   Hunger Vital Sign    Worried About Running Out of Food in the Last Year: Never true    Ran Out of Food in the Last Year: Never true  Transportation Needs: No Transportation Needs (11/02/2021)   PRAPARE - Hydrologist (Medical): No    Lack of Transportation (Non-Medical): No  Physical Activity: Inactive (11/02/2021)   Exercise Vital Sign    Days of Exercise per Week: 0 days    Minutes of Exercise per Session: 0 min  Stress: No Stress Concern Present (11/02/2021)   [place]    Feeling of Stress : Not at all  Social Connections: Moderately Isolated (11/02/2021)   Social Connection and Isolation Panel [NHANES]    Frequency of Communication with Friends and Family: More than three times a week    Frequency of Social Gatherings with Friends and Family: Once a week    Attends Religious Services: Never    Marine scientist or Organizations: No    Attends Music therapist: Never    Marital Status: Married    Tobacco Counseling Counseling given: Not Answered   Clinical Intake:  Pre-visit preparation completed: Yes  Pain : 0-10 Pain Score: 4  Pain Location: Knee Pain Descriptors / Indicators: Constant, Burning, Aching Pain Onset: More than a month ago Pain Frequency: Constant Pain Relieving Factors: tylenol  Pain Relieving Factors: tylenol  Diabetes: No  How often do you need to have  someone help you when you read instructions, pamphlets, or other written materials from your doctor or pharmacy?: 1 - Never  Diabetic?  no   Interpreter Needed?: No  Information entered by :: Leroy Kennedy LPN   Activities of Daily Living    11/02/2021   11:03 AM  In your present state of health, do you have any difficulty performing the following activities:  Hearing? 1  Vision? 1  Difficulty concentrating or making decisions? 0  Walking or climbing stairs? 0  Dressing or bathing? 0  Doing errands, shopping? 0  Preparing Food and eating ? N  Using the Toilet? N  In the past six months, have you accidently leaked urine? N  Do you have problems with loss of bowel control? N  Managing your Medications? N  Managing your Finances? N  Housekeeping or managing your Housekeeping? N  Patient Care Team: Jon Billings, NP as PCP - General (Nurse Practitioner) Manya Silvas, MD (Inactive) (Gastroenterology) Dawayne Patricia, MD as Referring Physician (Orthopedic Surgery) Carloyn Manner, MD as Referring Physician (Otolaryngology)  Indicate any recent Medical Services you may have received from other than [facility] providers in the past year (date may be approximate).     Assessment:   This is a routine wellness examination for Theodus.  Hearing/Vision screen Hearing Screening - Comments:: Bilateral hearing aids Vision Screening - Comments:: Up to date Macular degeneration  [place]  Dietary issues and exercise activities discussed: Current Exercise Habits: The patient does not participate in regular exercise at present, Exercise limited by: orthopedic condition(s)   Goals Addressed             This Visit's Progress    DIET - INCREASE WATER INTAKE   On track    Recommend drinking at least 6-8 glasses of water a day      Patient Stated   Not on track    10/31/2020, wants to lose weight     Weight (lb) < 200 lb (90.7 kg)         Depression Screen     11/02/2021   11:02 AM 11/02/2021   11:01 AM 11/02/2021   10:47 AM 10/21/2021    3:16 PM 09/28/2021   10:47 AM 09/25/2021    1:05 PM 08/19/2021    8:49 AM  PHQ 2/9 Scores  PHQ - 2 Score 0 0 0 0 0 0 0  PHQ- 9 Score '2 2 2 '$ 0 0 0 1    Fall Risk    11/02/2021   10:41 AM 10/21/2021    3:16 PM 09/28/2021   10:47 AM 09/25/2021    1:05 PM 08/19/2021    8:49 AM  Fall Risk   Falls in the past year? 1 0 '1 1 1  '$ Number falls in past yr: 0 0 1 1 0  Injury with Fall? 0 0 0 1 0  Risk for fall due to :  No Fall Risks History of fall(s) History of fall(s) No Fall Risks  Follow up Falls evaluation completed;Education provided;Falls prevention discussed Falls evaluation completed Falls evaluation completed Falls evaluation completed Falls evaluation completed    FALL RISK PREVENTION PERTAINING TO THE HOME:  Any stairs in or around the home? No  If so, are there any without handrails? No  Home free of loose throw rugs in walkways, pet beds, electrical cords, etc? Yes  Adequate lighting in your home to reduce risk of falls? Yes   ASSISTIVE DEVICES UTILIZED TO PREVENT FALLS:  Life alert? No  Use of a cane, walker or w/c? No  Grab bars in the bathroom? Yes  Shower chair or bench in shower? Yes  Elevated toilet seat or a handicapped toilet? Yes   TIMED UP AND GO:  Was the test performed? No .    Cognitive Function:        11/02/2021   10:43 AM 10/31/2020   10:43 AM 07/09/2019   10:50 AM 06/28/2018    3:21 PM 06/23/2017    4:04 PM  6CIT Screen  What Year? 0 points 0 points 0 points 0 points 0 points  What month? 0 points 0 points 0 points 0 points 0 points  What time? 0 points 0 points 0 points 0 points 0 points  Count back from 20 0 points 0 points 0 points 0 points 0 points  Months in reverse 4 points 2 points  4 points 0 points 0 points  Repeat phrase 2 points 4 points 0 points 0 points 0 points  Total Score 6 points 6 points 4 points 0 points 0 points    Immunizations Immunization History   Administered Date(s) Administered   Fluad Quad(high Dose 65+) 11/20/2020   Influenza, High Dose Seasonal PF 10/30/2014, 11/06/2015, 11/08/2016, 10/31/2017, 11/21/2019   Influenza,inj,Quad PF,6+ Mos 10/30/2014, 11/01/2018   PFIZER Comirnaty(Gray Top)Covid-19 Tri-Sucrose Vaccine 11/21/2019   PFIZER(Purple Top)SARS-COV-2 Vaccination 04/02/2019, 04/23/2019   Pneumococcal Conjugate-13 10/29/2013   Pneumococcal Polysaccharide-23 06/23/2017   Td 10/11/2007, 02/20/2018   Zoster, Live 02/24/2011    TDAP status: Up to date  Flu Vaccine status: Due, Education has been provided regarding the importance of this vaccine. Advised may receive this vaccine at local pharmacy or Health Dept. Aware to provide a copy of the vaccination record if obtained from local pharmacy or Health Dept. Verbalized acceptance and understanding.  Pneumococcal vaccine status: Up to date  Covid-19 vaccine status: Information provided on how to obtain vaccines.   Qualifies for Shingles Vaccine? Yes   Zostavax completed No   Shingrix Completed?: No.    Education has been provided regarding the importance of this vaccine. Patient has been advised to call insurance company to determine out of pocket expense if they have not yet received this vaccine. Advised may also receive vaccine at local pharmacy or Health Dept. Verbalized acceptance and understanding.  Screening Tests Health Maintenance  Topic Date Due   Zoster Vaccines- Shingrix (1 of 2) Never done   COVID-19 Vaccine (4 - Pfizer series) 01/16/2020   INFLUENZA VACCINE  09/01/2021   COLONOSCOPY (Pts 45-20yr Insurance coverage will need to be confirmed)  01/06/2022   TETANUS/TDAP  02/21/2028   Pneumonia Vaccine 71 Years old  Completed   Hepatitis C Screening  Completed   HPV VACCINES  Aged Out    Health Maintenance  Health Maintenance Due  Topic Date Due   Zoster Vaccines- Shingrix (1 of 2) Never done   COVID-19 Vaccine (4 - Pfizer series) 01/16/2020    INFLUENZA VACCINE  09/01/2021    Colorectal cancer screening: Type of screening: Colonoscopy. Completed 2022. Repeat every 1 years  ORDERED  Lung Cancer Screening: (Low Dose CT Chest recommended if Age 73-80years, 30 pack-year currently smoking OR have quit w/in 15years.) does not qualify.   Lung Cancer Screening Referral:   Additional Screening:  Hepatitis C Screening: does not qualify; Completed 2019  Vision Screening: Recommended annual ophthalmology exams for early detection of glaucoma and other disorders of the eye. Is the patient up to date with their annual eye exam?  Yes  Who is the provider or what is the name of the office in which the patient attends annual eye exams? UNSURE OF NAME If pt is not established with a provider, would they like to be referred to a provider to establish care? No .   Dental Screening: Recommended annual dental exams for proper oral hygiene  Community Resource Referral / Chronic Care Management: CRR required this visit?  No   CCM required this visit?  No      Plan:     I have personally reviewed and noted the following in the patient's chart:   Medical and social history Use of alcohol, tobacco or illicit drugs  Current medications and supplements including opioid prescriptions. Patient is not currently taking opioid prescriptions. Functional ability and status Nutritional status Physical activity Advanced directives List of other physicians Hospitalizations, surgeries, and ER  visits in previous 12 months Vitals Screenings to include cognitive, depression, and falls Referrals and appointments  In addition, I have reviewed and discussed with patient certain preventive protocols, quality metrics, and best practice recommendations. A written personalized care plan for preventive services as well as general preventive health recommendations were provided to patient.     Leroy Kennedy, LPN   29/09/4728   Nurse Notes:

## 2021-11-10 ENCOUNTER — Telehealth: Payer: Self-pay

## 2021-11-10 ENCOUNTER — Other Ambulatory Visit: Payer: Self-pay | Admitting: Nurse Practitioner

## 2021-11-10 DIAGNOSIS — I10 Essential (primary) hypertension: Secondary | ICD-10-CM

## 2021-11-10 NOTE — Telephone Encounter (Signed)
Pt given lab results per notes of K. Holdsworth NP on 11/10/21.Marland Kitchen Pt verbalized understanding.

## 2021-11-10 NOTE — Telephone Encounter (Signed)
Requested medications are due for refill today.  yes  Requested medications are on the active medications list.  yes  Last refill. 10/28/2020 #90 3 rf  Future visit scheduled.   yes  Notes to clinic.  Rx written to expire 10/28/2021 - Rx is expired.    Requested Prescriptions  Pending Prescriptions Disp Refills   losartan (COZAAR) 100 MG tablet 90 tablet 3    Sig: Take 1 tablet (100 mg total) by mouth daily.     Cardiovascular:  Angiotensin Receptor Blockers Passed - 11/10/2021  1:04 PM      Passed - Cr in normal range and within 180 days    Creatinine, Ser  Date Value Ref Range Status  10/21/2021 0.86 0.76 - 1.27 mg/dL Final         Passed - K in normal range and within 180 days    Potassium  Date Value Ref Range Status  10/21/2021 3.9 3.5 - 5.2 mmol/L Final         Passed - Patient is not pregnant      Passed - Last BP in normal range    BP Readings from Last 1 Encounters:  10/21/21 135/84         Passed - Valid encounter within last 6 months    Recent Outpatient Visits           2 weeks ago Primary hypertension   [facility] Jon Billings, NP   1 month ago Abscess   [facility] Jon Billings, NP   1 month ago Abscess   [facility] Jon Billings, NP   1 month ago Abscess   [facility] [place], PA-C   2 months ago Herpes zoster without complication   [facility] Kathrine Haddock, NP       Future Appointments             In 5 months Jon Billings, NP [facility] [place]

## 2021-11-10 NOTE — Telephone Encounter (Signed)
Medication Refill - Medication: losartan (COZAAR) 100 MG tablet  Has the patient contacted their pharmacy? Yes.    (Agent: If yes, when and what did the pharmacy advise?) Contact PCP because several request have been sent and no response. Patient is about to run out.   Preferred Pharmacy (with phone number or street name):  [facility], [place] Phone:  [phone]  Fax:  [phone]      Has the patient been seen for an appointment in the last year OR does the patient have an upcoming appointment? Yes.    Agent: Please be advised that RX refills may take up to 3 business days. We ask that you follow-up with your pharmacy.

## 2021-11-11 MED ORDER — LOSARTAN POTASSIUM 100 MG PO TABS: 100.0000 mg | ORAL_TABLET | Freq: Every day | ORAL | 1 refills | Status: DC

## 2021-11-17 ENCOUNTER — Ambulatory Visit (LOCAL_COMMUNITY_HEALTH_CENTER): Payer: Medicare Other

## 2021-11-17 DIAGNOSIS — Z23 Encounter for immunization: Secondary | ICD-10-CM | POA: Diagnosis not present

## 2021-11-17 DIAGNOSIS — Z719 Counseling, unspecified: Secondary | ICD-10-CM

## 2021-11-17 NOTE — Progress Notes (Signed)
Client seen in home today for COVID-19 and Flu vaccine.  VIS for Flu and Covid-19 patient information sheet provided.     Are you feeling sick today? No   Have you ever received a dose of COVID-19 Vaccine? [employer], [place], [place], [place], Other) Yes  If yes, which vaccine and how many doses?    [place] 2   Did you bring the vaccination record card or other documentation?  Yes   Do you have a health condition or are undergoing treatment that makes you moderately or severely immunocompromised? This would include, but not be limited to: cancer, HIV, organ transplant, immunosuppressive therapy/high-dose corticosteroids, or moderate/severe primary immunodeficiency.  No  Have you received COVID-19 vaccine before or during hematopoietic cell transplant (HCT) or CAR-T-cell therapies? No  Have you ever had an allergic reaction to: (This would include a severe allergic reaction or a reaction that caused hives, swelling, or respiratory distress, including wheezing.) A component of a COVID-19 vaccine or a previous dose of COVID-19 vaccine? No   Have you ever had an allergic reaction to another vaccine (other thanCOVID-19 vaccine) or an injectable medication? (This would include a severe allergic reaction or a reaction that caused hives, swelling, or respiratory distress, including wheezing.)   No    Do you have a history of any of the following:  Myocarditis or Pericarditis No  Dermal fillers:  No  Multisystem Inflammatory Syndrome (MIS-C or MIS-A)? No  COVID-19 disease within the past 3 months? No  Vaccinated with monkeypox vaccine in the last 4 weeks? No  Vaccine administered and tolerated well.  After vaccine care reviewed.  Covid-19 and flu cards provided for documentation.    Legacy Carrender Shelda Pal, RN

## 2021-11-26 ENCOUNTER — Other Ambulatory Visit: Payer: Self-pay

## 2021-11-26 MED ORDER — LEVOTHYROXINE SODIUM 125 MCG PO TABS: 125.0000 ug | ORAL_TABLET | Freq: Every day | ORAL | 0 refills | Status: DC

## 2021-11-26 NOTE — Telephone Encounter (Signed)
Pt req refill on levothyroxine 125 mcg last filled 07/16/2021 #90 0 RF. Last seen in office 10/21/2021. Please advise, upcoming appt for 04/21/2022.

## 2021-12-09 ENCOUNTER — Other Ambulatory Visit: Payer: Self-pay

## 2021-12-14 NOTE — Progress Notes (Signed)
I, as supervising provider, have reviewed the nurse health advisor's Medicare Wellness Visit note for this patient and concur with the findings and recommendations listed above.   

## 2021-12-29 DIAGNOSIS — H353221 Exudative age-related macular degeneration, left eye, with active choroidal neovascularization: Secondary | ICD-10-CM | POA: Diagnosis not present

## 2022-02-08 ENCOUNTER — Other Ambulatory Visit: Payer: Self-pay | Admitting: Nurse Practitioner

## 2022-03-09 DIAGNOSIS — H353221 Exudative age-related macular degeneration, left eye, with active choroidal neovascularization: Secondary | ICD-10-CM | POA: Diagnosis not present

## 2022-03-09 DIAGNOSIS — H35033 Hypertensive retinopathy, bilateral: Secondary | ICD-10-CM | POA: Diagnosis not present

## 2022-03-09 DIAGNOSIS — H43811 Vitreous degeneration, right eye: Secondary | ICD-10-CM | POA: Diagnosis not present

## 2022-04-07 ENCOUNTER — Other Ambulatory Visit: Payer: Self-pay | Admitting: Nurse Practitioner

## 2022-04-07 DIAGNOSIS — I10 Essential (primary) hypertension: Secondary | ICD-10-CM

## 2022-04-07 NOTE — Telephone Encounter (Signed)
Requested Prescriptions  Pending Prescriptions Disp Refills   losartan (COZAAR) 100 MG tablet [Pharmacy Med Name: LOSARTAN POTASSIUM 100 MG Tablet] 90 tablet 0    Sig: TAKE 1 TABLET (100 MG TOTAL) BY MOUTH DAILY.     Cardiovascular:  Angiotensin Receptor Blockers Passed - 04/07/2022  3:01 AM      Passed - Cr in normal range and within 180 days    Creatinine, Ser  Date Value Ref Range Status  10/21/2021 0.86 0.76 - 1.27 mg/dL Final         Passed - K in normal range and within 180 days    Potassium  Date Value Ref Range Status  10/21/2021 3.9 3.5 - 5.2 mmol/L Final         Passed - Patient is not pregnant      Passed - Last BP in normal range    BP Readings from Last 1 Encounters:  10/21/21 135/84         Passed - Valid encounter within last 6 months    Recent Outpatient Visits           5 months ago Primary hypertension   [place], NP   6 months ago [place], NP   6 months ago [place], NP   6 months ago Abscess   [place], PA-C   7 months ago Herpes zoster without complication   [place], [place], NP       Future Appointments             In 2 weeks Jon Billings, NP [place], [facility]

## 2022-04-21 ENCOUNTER — Encounter: Payer: Self-pay | Admitting: Nurse Practitioner

## 2022-04-21 ENCOUNTER — Ambulatory Visit (INDEPENDENT_AMBULATORY_CARE_PROVIDER_SITE_OTHER): Payer: Medicare Other | Admitting: Nurse Practitioner

## 2022-04-21 VITALS — BP 128/68 | HR 61 | Temp 97.7°F | Ht 65.98 in | Wt 223.8 lb

## 2022-04-21 DIAGNOSIS — R7303 Prediabetes: Secondary | ICD-10-CM

## 2022-04-21 DIAGNOSIS — E538 Deficiency of other specified B group vitamins: Secondary | ICD-10-CM | POA: Diagnosis not present

## 2022-04-21 DIAGNOSIS — E039 Hypothyroidism, unspecified: Secondary | ICD-10-CM

## 2022-04-21 DIAGNOSIS — D510 Vitamin B12 deficiency anemia due to intrinsic factor deficiency: Secondary | ICD-10-CM

## 2022-04-21 DIAGNOSIS — I10 Essential (primary) hypertension: Secondary | ICD-10-CM | POA: Diagnosis not present

## 2022-04-21 MED ORDER — LOSARTAN POTASSIUM 100 MG PO TABS: 100.0000 mg | ORAL_TABLET | Freq: Every day | ORAL | 1 refills | Status: DC

## 2022-04-21 MED ORDER — LEVOTHYROXINE SODIUM 125 MCG PO TABS: 125.0000 ug | ORAL_TABLET | Freq: Every day | ORAL | 1 refills | Status: DC

## 2022-04-21 NOTE — Assessment & Plan Note (Signed)
Controlled.  Compliant with med regimen, with at home measurements at goal. Continue with losartan 100mg .  Refills sent today.  Labs ordered.  Follow up in 6 months.  Call sooner if concerns arise.

## 2022-04-21 NOTE — Assessment & Plan Note (Signed)
Chronic.  Controlled.  Continue with current medication regimen of Levothyroxine.  Refills sent today.  Labs ordered today.  Return to clinic in 6 months for reevaluation.  Call sooner if concerns arise.   

## 2022-04-21 NOTE — Progress Notes (Signed)
BP 128/68   Pulse 61   Temp 97.7 F (36.5 C) (Oral)   Ht 5' 5.98" (1.676 m)   Wt 223 lb 12.8 oz (101.5 kg)   SpO2 97%   BMI 36.14 kg/m    Subjective:    Patient ID: Joshua [name], male    DOB: 12/05/1950, 72 y.o.   MRN: [id number]  HPI: Joshua [name] is a 72 y.o. male  Chief Complaint  Patient presents with   Hypertension   Medication Refill   Diabetes   Hyperlipidemia   HYPERTENSION / HYPERLIPIDEMIA Satisfied with current treatment? yes Duration of hypertension: years BP monitoring frequency: not checking BP range:  BP medication side effects: no Past BP meds: losartan (cozaar) Duration of hyperlipidemia: years Cholesterol medication side effects: no Cholesterol supplements: none Past cholesterol medications: none Medication compliance: excellent compliance Aspirin: no Recent stressors: no Recurrent headaches: no Visual changes: no Palpitations: no Dyspnea: no Chest pain: no Lower extremity edema: no Dizzy/lightheaded: no  HYPOTHYROIDISM Thyroid control status:controlled Satisfied with current treatment? yes Medication side effects: no Medication compliance: excellent compliance Etiology of hypothyroidism:  Recent dose adjustment:no Fatigue: no Cold intolerance: no Heat intolerance: no Weight gain: no Weight loss: no Constipation: no Diarrhea/loose stools: no Palpitations: no Lower extremity edema: no Anxiety/depressed mood: no  ANEMIA Anemia status: controlled Etiology of anemia: pernicious anemia Duration of anemia treatment: years Compliance with treatment: excellent compliance Iron supplementation side effects: no Severity of anemia: mild Fatigue: no Decreased exercise tolerance: no  Dyspnea on exertion: no Palpitations: no Bleeding: no Pica: no    Relevant past medical, surgical, family and social history reviewed and updated as indicated. Interim medical history since our last visit reviewed. Allergies and medications  reviewed and updated.  Review of Systems  Constitutional:  Negative for fatigue and unexpected weight change.  Eyes:  Negative for visual disturbance.  Respiratory:  Negative for chest tightness and shortness of breath.   Cardiovascular:  Negative for chest pain, palpitations and leg swelling.  Gastrointestinal:  Negative for constipation and diarrhea.  Endocrine: Negative for cold intolerance and heat intolerance.  Neurological:  Negative for dizziness, light-headedness and headaches.  Psychiatric/Behavioral:  Negative for dysphoric mood. The patient is not nervous/anxious.     Per HPI unless specifically indicated above     Objective:    BP 128/68   Pulse 61   Temp 97.7 F (36.5 C) (Oral)   Ht 5' 5.98" (1.676 m)   Wt 223 lb 12.8 oz (101.5 kg)   SpO2 97%   BMI 36.14 kg/m   Wt Readings from Last 3 Encounters:  04/21/22 223 lb 12.8 oz (101.5 kg)  10/21/21 224 lb 8 oz (101.8 kg)  09/28/21 217 lb 12.8 oz (98.8 kg)    Physical Exam Vitals and nursing note reviewed.  Constitutional:      General: He is not in acute distress.    Appearance: Normal appearance. He is obese. He is not ill-appearing, toxic-appearing or diaphoretic.  HENT:     Head: Normocephalic.     Right Ear: External ear normal.     Left Ear: External ear normal.     Nose: Nose normal. No congestion or rhinorrhea.     Mouth/Throat:     Mouth: Mucous membranes are moist.  Eyes:     General:        Right eye: No discharge.        Left eye: No discharge.     Extraocular Movements: Extraocular movements intact.  Conjunctiva/sclera: Conjunctivae normal.     Pupils: Pupils are equal, round, and reactive to light.  Cardiovascular:     Rate and Rhythm: Normal rate and regular rhythm.     Heart sounds: No murmur heard. Pulmonary:     Effort: Pulmonary effort is normal. No respiratory distress.     Breath sounds: Normal breath sounds. No wheezing, rhonchi or rales.  Abdominal:     General: Abdomen is flat.  Bowel sounds are normal.  Musculoskeletal:     Cervical back: Normal range of motion and neck supple.  Skin:    General: Skin is warm and dry.     Capillary Refill: Capillary refill takes less than 2 seconds.  Neurological:     General: No focal deficit present.     Mental Status: He is alert and oriented to person, place, and time.  Psychiatric:        Mood and Affect: Mood normal.        Behavior: Behavior normal.        Thought Content: Thought content normal.        Judgment: Judgment normal.     Results for orders placed or performed in visit on 10/21/21  Comp Met (CMET)  Result Value Ref Range   Glucose 79 70 - 99 mg/dL   BUN 13 8 - 27 mg/dL   Creatinine, Ser 0.86 0.76 - 1.27 mg/dL   eGFR 91 >59 mL/min/1.73   BUN/Creatinine Ratio 15 10 - 24   Sodium 141 134 - 144 mmol/L   Potassium 3.9 3.5 - 5.2 mmol/L   Chloride 104 96 - 106 mmol/L   CO2 24 20 - 29 mmol/L   Calcium 9.3 8.6 - 10.2 mg/dL   Total Protein 6.4 6.0 - 8.5 g/dL   Albumin 4.2 3.8 - 4.8 g/dL   Globulin, Total 2.2 1.5 - 4.5 g/dL   Albumin/Globulin Ratio 1.9 1.2 - 2.2   Bilirubin Total 0.6 0.0 - 1.2 mg/dL   Alkaline Phosphatase 74 44 - 121 IU/L   AST 21 0 - 40 IU/L   ALT 15 0 - 44 IU/L  Lipid Profile  Result Value Ref Range   Cholesterol, Total 176 100 - 199 mg/dL   Triglycerides 131 0 - 149 mg/dL   HDL 47 >39 mg/dL   VLDL Cholesterol Cal 23 5 - 40 mg/dL   LDL Chol Calc (NIH) 106 (H) 0 - 99 mg/dL   Chol/HDL Ratio 3.7 0.0 - 5.0 ratio  HgB A1c  Result Value Ref Range   Hgb A1c MFr Bld 5.9 (H) 4.8 - 5.6 %   Est. average glucose Bld gHb Est-mCnc 123 mg/dL  B12  Result Value Ref Range   Vitamin B-12 198 (L) 232 - 1,245 pg/mL  TSH  Result Value Ref Range   TSH 1.980 0.450 - 4.500 uIU/mL  T4, free  Result Value Ref Range   Free T4 1.30 0.82 - 1.77 ng/dL      Assessment & Plan:   Problem List Items Addressed This Visit       Cardiovascular and Mediastinum   Benign essential HTN    Controlled.   Compliant with med regimen, with at home measurements at goal. Continue with losartan 100mg .  Refills sent today.  Labs ordered.  Follow up in 6 months.  Call sooner if concerns arise.       Relevant Medications   losartan (COZAAR) 100 MG tablet   Hypertension - Primary   Relevant Medications   losartan (  COZAAR) 100 MG tablet   Other Relevant Orders   Comp Met (CMET)     Endocrine   Hypothyroidism    Chronic.  Controlled.  Continue with current medication regimen of Levothyroxine.  Refills sent today.  Labs ordered today.  Return to clinic in 6 months for reevaluation.  Call sooner if concerns arise.        Relevant Medications   levothyroxine (SYNTHROID) 125 MCG tablet   Other Relevant Orders   TSH   T4, free     Other   B12 deficiency    Labs ordered at visit today.  Will make recommendations based on lab results. Patient is taking B12 supplement at home.         Relevant Orders   B12   Pernicious anemia    Labs ordered at visit today.  Will make recommendations based on lab results.        Relevant Orders   CBC w/Diff   Prediabetes    Labs ordered at visit today.  Will make recommendations based on lab results.        Relevant Orders   HgB A1c     Follow up plan: Return in about 6 months (around 10/22/2022) for HTN, HLD, DM2 FU.

## 2022-04-21 NOTE — Assessment & Plan Note (Signed)
Labs ordered at visit today.  Will make recommendations based on lab results.   

## 2022-04-21 NOTE — Assessment & Plan Note (Signed)
Labs ordered at visit today.  Will make recommendations based on lab results. Patient is taking B12 supplement at home.

## 2022-04-22 LAB — COMPREHENSIVE METABOLIC PANEL
ALT: 17 IU/L (ref 0–44)
AST: 17 IU/L (ref 0–40)
Albumin/Globulin Ratio: 2 (ref 1.2–2.2)
Albumin: 4.4 g/dL (ref 3.8–4.8)
Alkaline Phosphatase: 81 IU/L (ref 44–121)
BUN/Creatinine Ratio: 11 (ref 10–24)
BUN: 10 mg/dL (ref 8–27)
Bilirubin Total: 0.5 mg/dL (ref 0.0–1.2)
CO2: 25 mmol/L (ref 20–29)
Calcium: 9.5 mg/dL (ref 8.6–10.2)
Chloride: 104 mmol/L (ref 96–106)
Creatinine, Ser: 0.93 mg/dL (ref 0.76–1.27)
Globulin, Total: 2.2 g/dL (ref 1.5–4.5)
Glucose: 78 mg/dL (ref 70–99)
Potassium: 4 mmol/L (ref 3.5–5.2)
Sodium: 143 mmol/L (ref 134–144)
Total Protein: 6.6 g/dL (ref 6.0–8.5)
eGFR: 87 mL/min/{1.73_m2} (ref >59)

## 2022-04-22 LAB — CBC WITH DIFFERENTIAL/PLATELET
Basophils Absolute: 0 10*3/uL (ref 0.0–0.2)
Basos: 1 %
EOS (ABSOLUTE): 0.2 10*3/uL (ref 0.0–0.4)
Eos: 4 %
Hematocrit: 40.5 % (ref 37.5–51.0)
Hemoglobin: 13.5 g/dL (ref 13.0–17.7)
Immature Grans (Abs): 0 10*3/uL (ref 0.0–0.1)
Immature Granulocytes: 0 %
Lymphocytes Absolute: 1.8 10*3/uL (ref 0.7–3.1)
Lymphs: 32 %
MCH: 31.7 pg (ref 26.6–33.0)
MCHC: 33.3 g/dL (ref 31.5–35.7)
MCV: 95 fL (ref 79–97)
Monocytes Absolute: 0.5 10*3/uL (ref 0.1–0.9)
Monocytes: 9 %
Neutrophils Absolute: 3.1 10*3/uL (ref 1.4–7.0)
Neutrophils: 54 %
Platelets: 160 10*3/uL (ref 150–450)
RBC: 4.26 x10E6/uL (ref 4.14–5.80)
RDW: 12.9 % (ref 11.6–15.4)
WBC: 5.8 10*3/uL (ref 3.4–10.8)

## 2022-04-22 LAB — TSH: TSH: 1.57 u[IU]/mL (ref 0.450–4.500)

## 2022-04-22 LAB — HEMOGLOBIN A1C
Est. average glucose Bld gHb Est-mCnc: 126 mg/dL
Hgb A1c MFr Bld: 6 % — ABNORMAL HIGH (ref 4.8–5.6)

## 2022-04-22 LAB — T4, FREE: Free T4: 1.47 ng/dL (ref 0.82–1.77)

## 2022-04-22 LAB — VITAMIN B12: Vitamin B-12: 817 pg/mL (ref 232–1245)

## 2022-04-22 NOTE — Progress Notes (Signed)
Please let patient know that his lab work looks great.  His A1c is well controlled at 6.0.  No other concerns at this tie.  No need for medication changes.  Continue with current medication regimen.  Follow up as discussed.

## 2022-05-18 DIAGNOSIS — H5319 Other subjective visual disturbances: Secondary | ICD-10-CM | POA: Diagnosis not present

## 2022-05-18 DIAGNOSIS — H353221 Exudative age-related macular degeneration, left eye, with active choroidal neovascularization: Secondary | ICD-10-CM | POA: Diagnosis not present

## 2022-05-18 DIAGNOSIS — H35033 Hypertensive retinopathy, bilateral: Secondary | ICD-10-CM | POA: Diagnosis not present

## 2022-05-18 DIAGNOSIS — H35432 Paving stone degeneration of retina, left eye: Secondary | ICD-10-CM | POA: Diagnosis not present

## 2022-06-18 ENCOUNTER — Encounter: Payer: Self-pay | Admitting: *Deleted

## 2022-06-18 ENCOUNTER — Telehealth: Payer: Self-pay

## 2022-06-18 ENCOUNTER — Other Ambulatory Visit: Payer: Self-pay | Admitting: *Deleted

## 2022-06-18 ENCOUNTER — Telehealth: Payer: Self-pay | Admitting: *Deleted

## 2022-06-18 DIAGNOSIS — Z86010 Personal history of colonic polyps: Secondary | ICD-10-CM

## 2022-06-18 NOTE — Telephone Encounter (Signed)
error 

## 2022-06-18 NOTE — Telephone Encounter (Signed)
Gastroenterology Pre-Procedure Review  Request Date: 07/07/2022 Requesting Physician: Dr. Tobi Bastos  PATIENT REVIEW QUESTIONS: The patient responded to the following health history questions as indicated:    1. Are you having any GI issues? no 2. Do you have a personal history of Polyps? yes (01/06/2021) 3. Do you have a family history of Colon Cancer or Polyps? no 4. Diabetes Mellitus? no 5. Joint replacements in the past 12 months?no 6. Major health problems in the past 3 months?no 7. Any artificial heart valves, MVP, or defibrillator?no    MEDICATIONS & ALLERGIES:    Patient reports the following regarding taking any anticoagulation/antiplatelet therapy:   Plavix, Coumadin, Eliquis, Xarelto, Lovenox, Pradaxa, Brilinta, or Effient? no Aspirin? no  Patient confirms/reports the following medications:  Current Outpatient Medications  Medication Sig Dispense Refill   acetaminophen (TYLENOL) 500 MG tablet Take 500 mg by mouth every 6 (six) hours as needed.     augmented betamethasone dipropionate (DIPROLENE AF) 0.05 % cream Apply topically 2 (two) times daily. 30 g 0   cetirizine (ZYRTEC) 10 MG chewable tablet Chew 10 mg by mouth daily. Take once a day     hydrocortisone 2.5 % lotion Apply topically.     ketoconazole (NIZORAL) 2 % shampoo Apply topically.     levothyroxine (SYNTHROID) 125 MCG tablet Take 1 tablet (125 mcg total) by mouth daily. 90 tablet 1   losartan (COZAAR) 100 MG tablet Take 1 tablet (100 mg total) by mouth daily. 90 tablet 1   Multiple Vitamins-Minerals (EYE VITAMINS PO) Take by mouth.     No current facility-administered medications for this visit.    Patient confirms/reports the following allergies:  Allergies  Allergen Reactions   Aspirin Hives   Celery Oil Swelling   Flomax [Tamsulosin Hcl] Hives   Hydromorphone Hives   Other Hives    Trout   Oysters [Shellfish Allergy]    Amlodipine Besylate Swelling    Ankle edema   Latex Rash    No orders of the  defined types were placed in this encounter.   AUTHORIZATION INFORMATION Primary Insurance: 1D#: Group #:  Secondary Insurance: 1D#: Group #:  SCHEDULE INFORMATION: Date: 07/07/2022 Time: Location: [facility]

## 2022-06-18 NOTE — Telephone Encounter (Signed)
Pt left message to schedule colonoscopy please return call  

## 2022-06-18 NOTE — Telephone Encounter (Signed)
Colonoscopy schedule for 07/07/2022 with Dr Tobi Bastos

## 2022-06-20 ENCOUNTER — Other Ambulatory Visit: Payer: Self-pay | Admitting: Nurse Practitioner

## 2022-06-20 DIAGNOSIS — I10 Essential (primary) hypertension: Secondary | ICD-10-CM

## 2022-06-21 MED ORDER — NA SULFATE-K SULFATE-MG SULF 17.5-3.13-1.6 GM/177ML PO SOLN: 1.0000 | Freq: Once | ORAL | 0 refills | Status: DC

## 2022-06-21 MED ORDER — PEG 3350-KCL-NABCB-NACL-NASULF 236 G PO SOLR: 4000.0000 mL | Freq: Once | ORAL | 0 refills | Status: AC

## 2022-06-21 NOTE — Telephone Encounter (Signed)
Requested Prescriptions  Refused Prescriptions Disp Refills   losartan (COZAAR) 100 MG tablet [Pharmacy Med Name: LOSARTAN POTASSIUM 100 MG Tablet] 90 tablet 3    Sig: TAKE 1 TABLET (100 MG TOTAL) BY MOUTH DAILY.     Cardiovascular:  Angiotensin Receptor Blockers Passed - 06/20/2022  1:55 AM      Passed - Cr in normal range and within 180 days    Creatinine, Ser  Date Value Ref Range Status  04/21/2022 0.93 0.76 - 1.27 mg/dL Final         Passed - K in normal range and within 180 days    Potassium  Date Value Ref Range Status  04/21/2022 4.0 3.5 - 5.2 mmol/L Final         Passed - Patient is not pregnant      Passed - Last BP in normal range    BP Readings from Last 1 Encounters:  04/21/22 128/68         Passed - Valid encounter within last 6 months    Recent Outpatient Visits           2 months ago Primary hypertension   [facility] Larae Grooms, NP   8 months ago Primary hypertension   [facility] Larae Grooms, NP   8 months ago Abscess   [facility] Larae Grooms, NP   8 months ago Abscess   [place] [facility] Larae Grooms, NP   9 months ago Abscess   [facility] Mecum, Oswaldo Conroy, PA-C       Future Appointments             In 4 months Mecum, Oswaldo Conroy, PA-C [facility], [facility]

## 2022-06-22 DIAGNOSIS — H35432 Paving stone degeneration of retina, left eye: Secondary | ICD-10-CM | POA: Diagnosis not present

## 2022-06-22 DIAGNOSIS — H353221 Exudative age-related macular degeneration, left eye, with active choroidal neovascularization: Secondary | ICD-10-CM | POA: Diagnosis not present

## 2022-06-22 DIAGNOSIS — H35033 Hypertensive retinopathy, bilateral: Secondary | ICD-10-CM | POA: Diagnosis not present

## 2022-06-22 DIAGNOSIS — H5319 Other subjective visual disturbances: Secondary | ICD-10-CM | POA: Diagnosis not present

## 2022-07-05 ENCOUNTER — Telehealth: Payer: Self-pay

## 2022-07-05 NOTE — Telephone Encounter (Signed)
Patient left a voicemail and states that he has a colonoscopy schedule for 07/07/2022 and he does not know what time the colonoscopy is and it needs to be in the morning. He states that he only has a ride in the morning and needs to come in the morning

## 2022-07-05 NOTE — Telephone Encounter (Signed)
I have check the schedule and asked [name] to move patient due to transportation.  Then spoken to patient and inform him that I have asked for patient to be moved to the morning time. Reminded patient to call endo unit on Tuesday for the arrival time.

## 2022-07-06 ENCOUNTER — Encounter: Payer: Self-pay | Admitting: Gastroenterology

## 2022-07-07 ENCOUNTER — Ambulatory Visit
Admission: RE | Admit: 2022-07-07 | Discharge: 2022-07-07 | Disposition: A | Discharge status: Home / Self Care | Payer: Medicare Other | Attending: Gastroenterology | Admitting: Gastroenterology

## 2022-07-07 ENCOUNTER — Encounter
Admission: RE | Disposition: A | Discharge status: Home / Self Care | Payer: Self-pay | Source: Home / Self Care | Attending: Gastroenterology

## 2022-07-07 ENCOUNTER — Encounter: Payer: Self-pay | Admitting: Gastroenterology

## 2022-07-07 ENCOUNTER — Ambulatory Visit: Payer: Medicare Other | Admitting: Anesthesiology

## 2022-07-07 DIAGNOSIS — D122 Benign neoplasm of ascending colon: Secondary | ICD-10-CM | POA: Insufficient documentation

## 2022-07-07 DIAGNOSIS — Z860100 Personal history of colon polyps, unspecified: Secondary | ICD-10-CM

## 2022-07-07 DIAGNOSIS — K5790 Diverticulosis of intestine, part unspecified, without perforation or abscess without bleeding: Secondary | ICD-10-CM | POA: Diagnosis not present

## 2022-07-07 DIAGNOSIS — I10 Essential (primary) hypertension: Secondary | ICD-10-CM | POA: Diagnosis not present

## 2022-07-07 DIAGNOSIS — K5730 Diverticulosis of large intestine without perforation or abscess without bleeding: Secondary | ICD-10-CM | POA: Diagnosis not present

## 2022-07-07 DIAGNOSIS — D126 Benign neoplasm of colon, unspecified: Secondary | ICD-10-CM

## 2022-07-07 DIAGNOSIS — Z1211 Encounter for screening for malignant neoplasm of colon: Secondary | ICD-10-CM | POA: Insufficient documentation

## 2022-07-07 DIAGNOSIS — K635 Polyp of colon: Secondary | ICD-10-CM | POA: Diagnosis not present

## 2022-07-07 HISTORY — PX: COLONOSCOPY WITH PROPOFOL: SHX5780

## 2022-07-07 SURGERY — COLONOSCOPY WITH PROPOFOL
Anesthesia: General

## 2022-07-07 MED ORDER — PROPOFOL 500 MG/50ML IV EMUL
INTRAVENOUS | Status: DC | PRN
  Administered 2022-07-07: 190.972 ug/kg/min via INTRAVENOUS

## 2022-07-07 MED ORDER — LIDOCAINE HCL (CARDIAC) PF 100 MG/5ML IV SOSY
PREFILLED_SYRINGE | INTRAVENOUS | Status: DC | PRN
  Administered 2022-07-07: 100 mg via INTRAVENOUS

## 2022-07-07 MED ORDER — SODIUM CHLORIDE 0.9 % IV SOLN
INTRAVENOUS | Status: DC
  Administered 2022-07-07: 1000 mL via INTRAVENOUS

## 2022-07-07 MED ORDER — STERILE WATER FOR IRRIGATION IR SOLN
Status: DC | PRN
  Administered 2022-07-07: 100 mL

## 2022-07-07 MED ORDER — PROPOFOL 10 MG/ML IV BOLUS
INTRAVENOUS | Status: DC | PRN
  Administered 2022-07-07: 90 mg via INTRAVENOUS

## 2022-07-07 NOTE — Transfer of Care (Signed)
Immediate Anesthesia Transfer of Care Note  Patient: Joshua [name]  Procedure(s) Performed: COLONOSCOPY WITH PROPOFOL  Patient Location: Endoscopy Unit  Anesthesia Type:General  Level of Consciousness: drowsy  Airway & Oxygen Therapy: Patient Spontanous Breathing  Post-op Assessment: Report given to RN and Post -op Vital signs reviewed and stable  Post vital signs: Reviewed and stable  Last Vitals:  Vitals Value Taken Time  BP 102/66 07/07/22 1051  Temp 36.4 C 07/07/22 1050  Pulse 74 07/07/22 1051  Resp 18 07/07/22 1051  SpO2 97 % 07/07/22 1051  Vitals shown include unvalidated device data.  Last Pain:  Vitals:   07/07/22 1050  TempSrc: Temporal  PainSc: Asleep         Complications: No notable events documented.

## 2022-07-07 NOTE — Anesthesia Postprocedure Evaluation (Signed)
Anesthesia Post Note  Patient: Joshua [name]  Procedure(s) Performed: COLONOSCOPY WITH PROPOFOL  Patient location during evaluation: PACU Anesthesia Type: General Level of consciousness: awake and alert, oriented and patient cooperative Pain management: pain level controlled Vital Signs Assessment: post-procedure vital signs reviewed and stable Respiratory status: spontaneous breathing, nonlabored ventilation and respiratory function stable Cardiovascular status: blood pressure returned to baseline and stable Postop Assessment: adequate PO intake Anesthetic complications: no   No notable events documented.   Last Vitals:  Vitals:   07/07/22 0949 07/07/22 1050  BP: (!) 157/102 102/66  Pulse: 74 74  Resp: 18 12  Temp: (!) 36.4 C 36.4 C  SpO2: 99% 96%    Last Pain:  Vitals:   07/07/22 1112  TempSrc:   PainSc: 0-No pain                 Reed Breech

## 2022-07-07 NOTE — Op Note (Signed)
[facility] Gastroenterology Patient Name: Joshua [name] Procedure Date: 07/07/2022 10:23 AM MRN: [id number] Account #: [id number] Date of Birth: [birth year] Admit Type: Outpatient Age: 72 Room: [facility] ENDO ROOM 3 Gender: Male Note Status: Finalized Instrument Name: Prentice Docker [id number] Procedure:             Colonoscopy Indications:           Surveillance: History of piecemeal removal adenoma on                         last colonoscopy (< 3 yrs), Last colonoscopy: December                         2022 Providers:             Wyline Mood MD, MD Referring MD:          Larae Grooms (Referring MD) Medicines:             Monitored Anesthesia Care Complications:         No immediate complications. Procedure:             Pre-Anesthesia Assessment:                        - Prior to the procedure, a History and Physical was                         performed, and patient medications, allergies and                         sensitivities were reviewed. The patient's tolerance                         of previous anesthesia was reviewed.                        - The risks and benefits of the procedure and the                         sedation options and risks were discussed with the                         patient. All questions were answered and informed                         consent was obtained.                        - ASA Grade Assessment: II - A patient with mild                         systemic disease.                        After obtaining informed consent, the colonoscope was                         passed under direct vision. Throughout the procedure,                         the patient's blood pressure, pulse,  and oxygen                         saturations were monitored continuously. The                         Colonoscope was introduced through the anus and                         advanced to the the cecum, identified by the                         appendiceal  orifice. The colonoscopy was performed                         with ease. The patient tolerated the procedure well.                         The quality of the bowel preparation was excellent.                         The ileocecal valve, appendiceal orifice, and rectum                         were photographed. Findings:      The perianal and digital rectal examinations were normal.      Multiple small-mouthed diverticula were found in the entire colon.      A 5 mm polyp was found in the ascending colon. The polyp was sessile.       The polyp was removed with a cold snare. Resection and retrieval were       complete.      The exam was otherwise without abnormality on direct and retroflexion       views. Impression:            - Diverticulosis in the entire examined colon.                        - One 5 mm polyp in the ascending colon, removed with                         a cold snare. Resected and retrieved.                        - The examination was otherwise normal on direct and                         retroflexion views. Recommendation:        - Discharge patient to home (with escort).                        - Resume previous diet.                        - Continue present medications.                        - Await pathology results.                        -  Repeat colonoscopy in 3 years for surveillance. Procedure Code(s):     --- Professional ---                        [phone], Colonoscopy, flexible; with removal of                         tumor(s), polyp(s), or other lesion(s) by snare                         technique Diagnosis Code(s):     --- Professional ---                        Z86.010, Personal history of colonic polyps                        D12.2, Benign neoplasm of ascending colon                        K57.30, Diverticulosis of large intestine without                         perforation or abscess without bleeding CPT copyright 2022 American Medical Association. All  rights reserved. The codes documented in this report are preliminary and upon coder review may  be revised to meet current compliance requirements. Wyline Mood, MD Wyline Mood MD, MD 07/07/2022 10:48:12 AM This report has been signed electronically. Number of Addenda: 0 Note Initiated On: 07/07/2022 10:23 AM Scope Withdrawal Time: 0 hours 11 minutes 51 seconds  Total Procedure Duration: 0 hours 14 minutes 24 seconds  Estimated Blood Loss:  Estimated blood loss: none.      [facility]

## 2022-07-07 NOTE — Anesthesia Preprocedure Evaluation (Addendum)
Anesthesia Evaluation  Patient identified by MRN, date of birth, ID band Patient awake    Reviewed: Allergy & Precautions, NPO status , Patient's Chart, lab work & pertinent test results  History of Anesthesia Complications Negative for: history of anesthetic complications  Airway Mallampati: III   Neck ROM: Full    Dental  (+) Missing   Pulmonary sleep apnea and Continuous Positive Airway Pressure Ventilation    Pulmonary exam normal breath sounds clear to auscultation       Cardiovascular hypertension, Normal cardiovascular exam Rhythm:Regular Rate:Normal     Neuro/Psych  Headaches HOH; hx amnesia 2016    GI/Hepatic negative GI ROS,,,  Endo/Other  Hypothyroidism  Obesity   Renal/GU Renal disease (nephrolithiasis)     Musculoskeletal   Abdominal   Peds  Hematology negative hematology ROS (+)   Anesthesia Other Findings   Reproductive/Obstetrics                             Anesthesia Physical Anesthesia Plan  ASA: 3  Anesthesia Plan: General   Post-op Pain Management:    Induction: Intravenous  PONV Risk Score and Plan: 2 and Propofol infusion, TIVA and Treatment may vary due to age or medical condition  Airway Management Planned: Natural Airway  Additional Equipment:   Intra-op Plan:   Post-operative Plan:   Informed Consent: I have reviewed the patients History and Physical, chart, labs and discussed the procedure including the risks, benefits and alternatives for the proposed anesthesia with the patient or authorized representative who has indicated his/her understanding and acceptance.       Plan Discussed with: CRNA  Anesthesia Plan Comments: (LMA/GETA backup discussed.  Patient consented for risks of anesthesia including but not limited to:  - adverse reactions to medications - damage to eyes, teeth, lips or other oral mucosa - nerve damage due to positioning   - sore throat or hoarseness - damage to heart, brain, nerves, lungs, other parts of body or loss of life  Informed patient about role of CRNA in peri- and intra-operative care.  Patient voiced understanding.)       Anesthesia Quick Evaluation

## 2022-07-07 NOTE — H&P (Signed)
Wyline Mood, MD [address], [place], [place], [address] [address], [place], [place], [address] Phone: [phone]  Fax: [phone]  Primary Care Physician:  [facility]   Pre-Procedure History & Physical: HPI:  Joshua [name] is a 72 y.o. male is here for an colonoscopy.   Past Medical History:  Diagnosis Date   Allergy    Amnesia 06/13/2014   B12 deficiency 06/13/2014   Benign essential HTN 10/30/2014   BPH (benign prostatic hyperplasia)    BPH with obstruction/lower urinary tract symptoms 05/15/2015   History of kidney stones    Hypogonadism in male    Hypothyroidism 10/30/2014    Past Surgical History:  Procedure Laterality Date   COLONOSCOPY  March 2016   COLONOSCOPY WITH PROPOFOL N/A 05/29/2020   Procedure: COLONOSCOPY WITH PROPOFOL;  Surgeon: Wyline Mood, MD;  Location: [facility] ENDOSCOPY;  Service: Gastroenterology;  Laterality: N/A;   COLONOSCOPY WITH PROPOFOL N/A 01/06/2021   Procedure: COLONOSCOPY WITH PROPOFOL;  Surgeon: Wyline Mood, MD;  Location: [facility] ENDOSCOPY;  Service: Gastroenterology;  Laterality: N/A;   ESOPHAGOGASTRODUODENOSCOPY ENDOSCOPY  March 2016   EYE SURGERY Bilateral    cataracts   KNEE SURGERY Right    SHOULDER SURGERY Left    TONSILLECTOMY      Prior to Admission medications   Medication Sig Start Date End Date Taking? Authorizing Provider  augmented betamethasone dipropionate (DIPROLENE AF) 0.05 % cream Apply topically 2 (two) times daily. 08/19/21  Yes Gabriel Cirri, NP  levothyroxine (SYNTHROID) 125 MCG tablet Take 1 tablet (125 mcg total) by mouth daily. 04/21/22  Yes Larae Grooms, NP  losartan (COZAAR) 100 MG tablet Take 1 tablet (100 mg total) by mouth daily. 04/21/22 04/21/23 Yes Larae Grooms, NP  Multiple Vitamins-Minerals (EYE VITAMINS PO) Take by mouth.   Yes [provider]  acetaminophen (TYLENOL) 500 MG tablet Take 500 mg by mouth every 6 (six) hours as needed.    [provider]  cetirizine (ZYRTEC) 10 MG chewable tablet Chew 10 mg by mouth daily. Take once a day Patient not taking: Reported on 07/07/2022    [provider]  hydrocortisone 2.5 % lotion Apply topically. 08/11/21   [provider]  ketoconazole (NIZORAL) 2 % shampoo Apply topically. 08/11/21   [provider]    Allergies as of 06/18/2022 - Review Complete 04/21/2022  Allergen Reaction Noted   Aspirin Hives 10/30/2014   Celery oil Swelling 10/30/2014   Flomax [tamsulosin hcl] Hives 05/26/2015   Hydromorphone Hives 10/30/2014   Other Hives 04/21/2022   Oysters [shellfish allergy]  10/25/2017   Amlodipine besylate Swelling 08/24/2017   Latex Rash 10/30/2014    Family History  Problem Relation Age of Onset   Breast cancer Mother    COPD Father    Heart disease Father    Prostate cancer Father     Social History   Socioeconomic History   Marital status: Married    Spouse name: Not on file   Number of children: Not on file   Years of education: Not on file   Highest education level: High school graduate  Occupational History   Not on file  Tobacco Use   Smoking status: Never   Smokeless tobacco: Never  Vaping Use   Vaping Use: Never used  Substance and Sexual Activity   Alcohol use: Yes    Alcohol/week: 0.0 standard drinks of alcohol    Comment: Rarely   Drug use: No   Sexual activity: Not  Currently  Other Topics Concern   Not on file  Social History Narrative   Not on file   Social Determinants of Health   Financial Resource Strain: Low Risk  (11/02/2021)   Overall Financial Resource Strain (CARDIA)    Difficulty of Paying Living Expenses: Not hard at all  Food Insecurity: No Food Insecurity (11/02/2021)   Hunger Vital Sign    Worried About Running Out of Food in the Last Year: Never true    Ran Out of Food in the Last Year: Never true  Transportation Needs: No Transportation Needs (11/02/2021)   PRAPARE - Scientist, research (physical sciences) (Medical): No    Lack of Transportation (Non-Medical): No  Physical Activity: Inactive (11/02/2021)   Exercise Vital Sign    Days of Exercise per Week: 0 days    Minutes of Exercise per Session: 0 min  Stress: No Stress Concern Present (11/02/2021)   [employer] of Occupational Health - Occupational Stress Questionnaire    Feeling of Stress : Not at all  Social Connections: Moderately Isolated (11/02/2021)   Social Connection and Isolation Panel [NHANES]    Frequency of Communication with Friends and Family: More than three times a week    Frequency of Social Gatherings with Friends and Family: Once a week    Attends Religious Services: Never    Database administrator or Organizations: No    Attends Banker Meetings: Never    Marital Status: Married  Catering manager Violence: Not At Risk (11/02/2021)   Humiliation, Afraid, Rape, and Kick questionnaire    Fear of Current or Ex-Partner: No    Emotionally Abused: No    Physically Abused: No    Sexually Abused: No    Review of Systems: See HPI, otherwise negative ROS  Physical Exam: BP (!) 157/102   Pulse 74   Temp (!) 97.5 F (36.4 C) (Temporal)   Resp 18   Ht 5\' 7"  (1.702 m)   Wt 96 kg   SpO2 99%   BMI 33.15 kg/m  General:   Alert,  pleasant and cooperative in NAD Head:  Normocephalic and atraumatic. Neck:  Supple; no masses or thyromegaly. Lungs:  Clear throughout to auscultation, normal respiratory effort.    Heart:  +S1, +S2, Regular rate and rhythm, No edema. Abdomen:  Soft, nontender and nondistended. Normal bowel sounds, without guarding, and without rebound.   Neurologic:  Alert and  oriented x4;  grossly normal neurologically.  Impression/Plan: Joshua [name] is here for an colonoscopy to be performed for surveillance due to prior history of colon polyps   Risks, benefits, limitations, and alternatives regarding  colonoscopy have been reviewed with the patient.  Questions have  been answered.  All parties agreeable.   Wyline Mood, MD  07/07/2022, 9:52 AM

## 2022-07-08 ENCOUNTER — Encounter: Payer: Self-pay | Admitting: Gastroenterology

## 2022-07-22 DIAGNOSIS — H353131 Nonexudative age-related macular degeneration, bilateral, early dry stage: Secondary | ICD-10-CM | POA: Diagnosis not present

## 2022-08-17 ENCOUNTER — Encounter: Payer: Self-pay | Admitting: Nurse Practitioner

## 2022-08-30 ENCOUNTER — Ambulatory Visit: Payer: Medicare Other

## 2022-08-30 ENCOUNTER — Ambulatory Visit: Payer: Self-pay | Admitting: *Deleted

## 2022-08-30 VITALS — BP 112/75 | HR 95 | Resp 14 | Ht 67.0 in | Wt 217.0 lb

## 2022-08-30 DIAGNOSIS — I10 Essential (primary) hypertension: Secondary | ICD-10-CM

## 2022-08-30 DIAGNOSIS — G4733 Obstructive sleep apnea (adult) (pediatric): Secondary | ICD-10-CM | POA: Diagnosis not present

## 2022-08-30 DIAGNOSIS — Z7189 Other specified counseling: Secondary | ICD-10-CM

## 2022-08-30 NOTE — Progress Notes (Unsigned)
[facility] [address] [place], [place] [address]  Pulmonary Sleep Medicine   Office Visit Note  Patient Name: Joshua [name] DOB: [birth year] MRN [id number]    Chief Complaint: Obstructive Sleep Apnea visit  Brief History:  Joshua [name] is seen today for an annual follow up on APAP at 9-17 cmh20.  The patient has a 9 year history of sleep apnea. Patient is using PAP nightly.  The patient feels rested after sleeping with PAP.  The patient reports benefiting from PAP use. Reported sleepiness is  improved and the Epworth Sleepiness Score is 4 out of 24. The patient does take naps. The patient complains of the following: Some issues with dryness and machine running out of water.  The compliance download shows 91% compliance with an average use time of 7:09 hours. The AHI is 2.8.  The patient does not complain of limb movements disrupting sleep.  ROS  General: (-) fever, (-) chills, (-) night sweat Nose and Sinuses: (-) nasal stuffiness or itchiness, (-) postnasal drip, (-) nosebleeds, (-) sinus trouble. Mouth and Throat: (-) sore throat, (-) hoarseness. Neck: (-) swollen glands, (-) enlarged thyroid, (-) neck pain. Respiratory: - cough, - shortness of breath, - wheezing. Neurologic: - numbness, - tingling. Psychiatric: - anxiety, - depression   Current Medication: Outpatient Encounter Medications as of 08/30/2022  Medication Sig   acetaminophen (TYLENOL) 500 MG tablet Take 500 mg by mouth every 6 (six) hours as needed.   augmented betamethasone dipropionate (DIPROLENE AF) 0.05 % cream Apply topically 2 (two) times daily.   cetirizine (ZYRTEC) 10 MG chewable tablet Chew 10 mg by mouth daily. Take once a day (Patient not taking: Reported on 07/07/2022)   hydrocortisone 2.5 % lotion Apply topically.   ketoconazole (NIZORAL) 2 % shampoo Apply topically.   levothyroxine (SYNTHROID) 125 MCG tablet Take 1 tablet (125 mcg total) by mouth daily.   losartan (COZAAR) 100 MG tablet  Take 1 tablet (100 mg total) by mouth daily.   Multiple Vitamins-Minerals (EYE VITAMINS PO) Take by mouth.   No facility-administered encounter medications on file as of 08/30/2022.    Surgical History: Past Surgical History:  Procedure Laterality Date   COLONOSCOPY  March 2016   COLONOSCOPY WITH PROPOFOL N/A 05/29/2020   Procedure: COLONOSCOPY WITH PROPOFOL;  Surgeon: Wyline Mood, MD;  Location: [facility] ENDOSCOPY;  Service: Gastroenterology;  Laterality: N/A;   COLONOSCOPY WITH PROPOFOL N/A 01/06/2021   Procedure: COLONOSCOPY WITH PROPOFOL;  Surgeon: Wyline Mood, MD;  Location: [facility] ENDOSCOPY;  Service: Gastroenterology;  Laterality: N/A;   COLONOSCOPY WITH PROPOFOL N/A 07/07/2022   Procedure: COLONOSCOPY WITH PROPOFOL;  Surgeon: Wyline Mood, MD;  Location: [facility] ENDOSCOPY;  Service: Gastroenterology;  Laterality: N/A;   ESOPHAGOGASTRODUODENOSCOPY ENDOSCOPY  March 2016   EYE SURGERY Bilateral    cataracts   KNEE SURGERY Right    SHOULDER SURGERY Left    TONSILLECTOMY      Medical History: Past Medical History:  Diagnosis Date   Allergy    Amnesia 06/13/2014   B12 deficiency 06/13/2014   Benign essential HTN 10/30/2014   BPH (benign prostatic hyperplasia)    BPH with obstruction/lower urinary tract symptoms 05/15/2015   History of kidney stones    Hypogonadism in male    Hypothyroidism 10/30/2014    Family History: Non contributory to the present illness  Social History: Social History   Socioeconomic History   Marital status: Married    Spouse name: Not on file   Number of children: Not on file  Years of education: Not on file   Highest education level: High school graduate  Occupational History   Not on file  Tobacco Use   Smoking status: Never   Smokeless tobacco: Never  Vaping Use   Vaping status: Never Used  Substance and Sexual Activity   Alcohol use: Yes    Alcohol/week: 0.0 standard drinks of alcohol    Comment: Rarely   Drug use: No   Sexual activity: Not  Currently  Other Topics Concern   Not on file  Social History Narrative   Not on file   Social Determinants of Health   Financial Resource Strain: Low Risk  (11/02/2021)   Overall Financial Resource Strain (CARDIA)    Difficulty of Paying Living Expenses: Not hard at all  Food Insecurity: No Food Insecurity (11/02/2021)   Hunger Vital Sign    Worried About Running Out of Food in the Last Year: Never true    Ran Out of Food in the Last Year: Never true  Transportation Needs: No Transportation Needs (11/02/2021)   PRAPARE - Administrator, Civil Service (Medical): No    Lack of Transportation (Non-Medical): No  Physical Activity: Inactive (11/02/2021)   Exercise Vital Sign    Days of Exercise per Week: 0 days    Minutes of Exercise per Session: 0 min  Stress: No Stress Concern Present (11/02/2021)   [employer] of Occupational Health - Occupational Stress Questionnaire    Feeling of Stress : Not at all  Social Connections: Moderately Isolated (11/02/2021)   Social Connection and Isolation Panel [NHANES]    Frequency of Communication with Friends and Family: More than three times a week    Frequency of Social Gatherings with Friends and Family: Once a week    Attends Religious Services: Never    Database administrator or Organizations: No    Attends Banker Meetings: Never    Marital Status: Married  Catering manager Violence: Not At Risk (11/02/2021)   Humiliation, Afraid, Rape, and Kick questionnaire    Fear of Current or Ex-Partner: No    Emotionally Abused: No    Physically Abused: No    Sexually Abused: No    Vital Signs: Blood pressure 112/75, pulse 95, resp. rate 14, height 5\' 7"  (1.702 m), weight 217 lb (98.4 kg), SpO2 96%. Body mass index is 33.99 kg/m.    Examination: General Appearance: The patient is well-developed, well-nourished, and in no distress. Neck Circumference: 47 cm Skin: Gross inspection of skin unremarkable. Head:  normocephalic, no gross deformities. Eyes: no gross deformities noted. ENT: ears appear grossly normal Neurologic: Alert and oriented. No involuntary movements.  STOP BANG RISK ASSESSMENT S (snore) Have you been told that you snore?     NO   T (tired) Are you often tired, fatigued, or sleepy during the day?   NO  O (obstruction) Do you stop breathing, choke, or gasp during sleep? NO   P (pressure) Do you have or are you being treated for high blood pressure? YES   B (BMI) Is your body index greater than 35 kg/m? NO   A (age) Are you 28 years old or older? YES   N (neck) Do you have a neck circumference greater than 16 inches?   YES   G (gender) Are you a male? YES   TOTAL STOP/BANG "YES" ANSWERS 4       A STOP-Bang score of 2 or less is considered low risk, and a score  of 5 or more is high risk for having either moderate or severe OSA. For people who score 3 or 4, doctors may need to perform further assessment to determine how likely they are to have OSA.         EPWORTH SLEEPINESS SCALE:  Scale:  (0)= no chance of dozing; (1)= slight chance of dozing; (2)= moderate chance of dozing; (3)= high chance of dozing  Chance  Situtation    Sitting and reading: 0    Watching TV: 1    Sitting Inactive in public: 1    As a passenger in car: 0      Lying down to rest: 2    Sitting and talking: 0    Sitting quielty after lunch: 0    In a car, stopped in traffic: 0   TOTAL SCORE:   4 out of 24    SLEEP STUDIES:  PSG (06/14/13) AHI 22, supine AHI 59, min SPO2 88%   CPAP COMPLIANCE DATA:  Date Range: 08/26/21 - 08/25/22  Average Daily Use: 7:09 hours  Median Use: 7:34 hours  Compliance for > 4 Hours: 333 days  AHI: 2.8 respiratory events per hour  Days Used: 358/365  Mask Leak: 32.1  95th Percentile Pressure: 16.7 cmh20         LABS: No results found for this or any previous visit (from the past 2160 hour(s)).  Radiology: No results  found.  No results found.  No results found.    Assessment and Plan: Patient Active Problem List   Diagnosis Date Noted   Hx of colonic polyps 07/07/2022   Adenomatous polyp of colon 07/07/2022   Caregiver stress 08/19/2021   Decreased ROM of right shoulder 12/30/2020   Recurrent UTI 12/30/2020   Frequency of urination and polyuria 12/02/2020   Right shoulder pain 12/02/2020   Need for influenza vaccination 11/20/2020   Bilateral shoulder pain 11/20/2020   OSA on CPAP 08/18/2020   CPAP use counseling 08/18/2020   Hypertension 08/18/2020   Ear pain 01/10/2020   Obesity (BMI 30-39.9) 01/08/2020   Headache 01/08/2020   Prediabetes 10/12/2019   Cyst of soft tissue 10/11/2019   Urinary frequency 10/11/2019   Small vessel disease (HCC) 10/25/2017   Chest pain 08/24/2017   Pernicious anemia 06/10/2016   Advanced care planning/counseling discussion 06/10/2016   Screening for prostate cancer 06/10/2015   Nephrolithiasis 06/10/2015   Erectile dysfunction 06/10/2015   BPH with obstruction/lower urinary tract symptoms 05/15/2015   Benign essential HTN 10/30/2014   Hypothyroidism 10/30/2014   B12 deficiency 06/13/2014   Memory loss 06/13/2014    1. OSA on CPAP The patient does tolerate PAP and reports  benefit from PAP use. His usage has been a bit lower than usual lately due to health issues of his wife he is needing to attend to. The patient was reminded how to clean equipment and advised to wear his chin strap more consistently to help control his leak. The patient was also counselled on weight loss. The compliance is very good. The AHI is 2.8.   OSA on cpap- controlled. Continue with excellent compliance with pap. CPAP continues to be medically necessary to treat this patient's OSA. F/u one year.     2. CPAP use counseling CPAP Counseling: had a lengthy discussion with the patient regarding the importance of PAP therapy in management of the sleep apnea. Patient appears to  understand the risk factor reduction and also understands the risks associated with untreated sleep apnea. Patient  will try to make a good faith effort to remain compliant with therapy. Also instructed the patient on proper cleaning of the device including the water must be changed daily if possible and use of distilled water is preferred. Patient understands that the machine should be regularly cleaned with appropriate recommended cleaning solutions that do not damage the PAP machine for example given white vinegar and water rinses. Other methods such as ozone treatment may not be as good as these simple methods to achieve cleaning.   3. Hypertension, unspecified type Hypertension Counseling:   The following hypertensive lifestyle modification were recommended and discussed:  1. Limiting alcohol intake to less than 1 oz/day of ethanol:(24 oz of beer or 8 oz of wine or 2 oz of 100-proof whiskey). 2. Take baby ASA 81 mg daily. 3. Importance of regular aerobic exercise and losing weight. 4. Reduce dietary saturated fat and cholesterol intake for overall cardiovascular health. 5. Maintaining adequate dietary potassium, calcium, and magnesium intake. 6. Regular monitoring of the blood pressure. 7. Reduce sodium intake to less than 100 mmol/day (less than 2.3 gm of sodium or less than 6 gm of sodium choride)      General Counseling: I have discussed the findings of the evaluation and examination with Joshua [name].  I have also discussed any further diagnostic evaluation thatmay be needed or ordered today. Joshua [name] verbalizes understanding of the findings of todays visit. We also reviewed his medications today and discussed drug interactions and side effects including but not limited excessive drowsiness and altered mental states. We also discussed that there is always a risk not just to him but also people around him. he has been encouraged to call the office with any questions or concerns that should arise related  to todays visit.  No orders of the defined types were placed in this encounter.       I have personally obtained a history, examined the patient, evaluated laboratory and imaging results, formulated the assessment and plan and placed orders. This patient was seen today by Emmaline Kluver, PA-C in collaboration with Dr. Freda Munro.   Yevonne Pax, MD [facility] Diplomate ABMS Pulmonary Critical Care Medicine and Sleep Medicine

## 2022-08-30 NOTE — Telephone Encounter (Signed)
Reason for Disposition  [1] MODERATE-SEVERE hives persist (i.e., hives interfere with normal activities or work) AND [2] taking antihistamine (e.g., Benadryl, Claritin) > 24 hours  Answer Assessment - Initial Assessment Questions 1. APPEARANCE: "What does the rash look like?"      Patient thinks ha had reaction to food- red hives everywhere 2. LOCATION: "Where is the rash located?"      Elbows, waist, arms, swelling at left wrist 3. NUMBER: "How many hives are there?"      multiple 4. SIZE: "How big are the hives?" (inches, cm, compare to coins) "Do they all look the same or is there lots of variation in shape and size?"      Large areas 5. ONSET: "When did the hives begin?" (Hours or days ago)      2 daysago 6. ITCHING: "Does it itch?" If Yes, ask: "How bad is the itch?"    - MILD: doesn't interfere with normal activities   - MODERATE-SEVERE: interferes with work, school, sleep, or other activities      severe 7. RECURRENT PROBLEM: "Have you had hives before?" If Yes, ask: "When was the last time?" and "What happened that time?"      Yes- prednisone 8. TRIGGERS: "Were you exposed to any new food, plant, cosmetic product or animal just before the hives began?"     Food reaction 9. OTHER SYMPTOMS: "Do you have any other symptoms?" (e.g., fever, tongue swelling, difficulty breathing, abdomen pain)     no  Protocols used: Hives-A-AH

## 2022-08-30 NOTE — Telephone Encounter (Signed)
Called and scheduled patient on 08/31/2022 @ 1:20 pm.

## 2022-08-30 NOTE — Telephone Encounter (Signed)
  Chief Complaint: wide spread hives- patient thinks he reacted to food/drink Symptoms: wide spread "blotches", itching Frequency: started 2 days ago Pertinent Negatives: Patient denies fever, tongue swelling, difficulty breathing, abdomen pain Disposition: [] ED /[] Urgent Care (no appt availability in office) / [] Appointment(In office/virtual)/ []  [facility]/ [] Home Care/ [x] Refused Recommended Disposition /[] [facility]/ []  Follow-up with PCP Additional Notes: Advised patient he needs appointment- but he states he just needs prednisone for his reaction-  patient advised he will need to be seen- but he states he has CPAP appointment this afternoon and just needs prednisone.

## 2022-08-30 NOTE — Patient Instructions (Signed)

## 2022-08-31 ENCOUNTER — Ambulatory Visit (INDEPENDENT_AMBULATORY_CARE_PROVIDER_SITE_OTHER): Payer: Medicare Other | Admitting: Family Medicine

## 2022-08-31 ENCOUNTER — Encounter: Payer: Self-pay | Admitting: Family Medicine

## 2022-08-31 VITALS — BP 130/80 | HR 68 | Ht 67.0 in | Wt 219.4 lb

## 2022-08-31 DIAGNOSIS — L509 Urticaria, unspecified: Secondary | ICD-10-CM | POA: Diagnosis not present

## 2022-08-31 NOTE — Progress Notes (Signed)
BP 130/80   Pulse 68   Ht 5\' 7"  (1.702 m)   Wt 219 lb 6.4 oz (99.5 kg)   SpO2 99%   BMI 34.36 kg/m    Subjective:    Patient ID: Joshua [name], male    DOB: 1950-04-18, 72 y.o.   MRN: [id number]  HPI: Joshua [name] is a 72 y.o. male  Chief Complaint  Patient presents with   Urticaria    Patient says he has a bad case of the "Hives" for the past three days. Patient says he noticed the hives on both sides of his torso, lower abdomen, and face. Patient says he also noticed his L forearm was swollen. 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 Patient says he is getting better and does not have the itching as much anymore. Patient says he has not an episode like this for about 10 years.    RASH He believes this was triggered by barbecue Joshua [name], he had not eating them in a long time, tried cherry flavored Mnt Dew for the first time, and also states he brought snuggle fabric softener that he's never used before. He has taken Benadryl but does not like how it makes him sleepy.  Duration: 3 days  Location:  bilateral waist to abdominal sides to underarms, elbows, buttocks, right eye lid itchy   Itching: yes Burning: no Redness: no Oozing: no Scaling: no Blisters: no Painful: no Fevers: no Change in detergents/soaps/personal care products: no Recent illness: no Recent travel:no History of same: yes had allergy testing done Context: better Alleviating factors: benadryl Treatments attempted:benadryl  Shortness of [name]: no  Throat/tongue swelling: no Myalgias/arthralgias: no   Relevant past medical, surgical, family and social history reviewed and updated as indicated. Interim medical history since our last visit reviewed. Allergies and medications reviewed and updated.  Review of Systems  Constitutional:  Negative for chills and fever.  Respiratory: Negative.    Cardiovascular: Negative.   Skin:   Positive for rash.    Per HPI unless specifically indicated above     Objective:    BP 130/80   Pulse 68   Ht 5\' 7"  (1.702 m)   Wt 219 lb 6.4 oz (99.5 kg)   SpO2 99%   BMI 34.36 kg/m   Wt Readings from Last 3 Encounters:  08/31/22 219 lb 6.4 oz (99.5 kg)  08/30/22 217 lb (98.4 kg)  07/07/22 211 lb 10.3 oz (96 kg)    Physical Exam Vitals and nursing note reviewed.  Constitutional:      General: He is awake. He is not in acute distress.    Appearance: Normal appearance. He is well-developed and well-groomed. He is not ill-appearing.  HENT:     Head: Normocephalic and atraumatic.     Right Ear: Hearing and external ear normal. No drainage.     Left Ear: Hearing and external ear normal. No drainage.     Nose: Nose normal.  Eyes:     General: Lids are normal.        Right eye: No discharge.        Left eye: No discharge.     Conjunctiva/sclera: Conjunctivae normal.  Cardiovascular:     Rate and Rhythm: Normal rate and regular rhythm.     Pulses:          Radial pulses are 2+ on the right side and 2+  on the left side.       Posterior tibial pulses are 2+ on the right side and 2+ on the left side.     Heart sounds: Normal heart sounds, S1 normal and S2 normal. No murmur heard.    No gallop.  Pulmonary:     Effort: Pulmonary effort is normal. No accessory muscle usage or respiratory distress.     [name] sounds: Normal [name] sounds.  Musculoskeletal:        General: Normal range of motion.     Cervical back: Full passive range of motion without pain and normal range of motion.     Right lower leg: No edema.     Left lower leg: No edema.  Skin:    General: Skin is warm and dry.     Capillary Refill: Capillary refill takes less than 2 seconds.     Findings: Erythema and rash present. Rash is macular.     Comments: Slight indentation of healing urticarial rash on LUQ and RUQ of abdomen.   Neurological:     Mental Status: He is alert and oriented to person, place, and  time.  Psychiatric:        Attention and Perception: Attention normal.        Mood and Affect: Mood normal.        Speech: Speech normal.        Behavior: Behavior normal. Behavior is cooperative.        Thought Content: Thought content normal.     Results for orders placed or performed in visit on 04/21/22  Comp Met (CMET)  Result Value Ref Range   Glucose 78 70 - 99 mg/dL   BUN 10 8 - 27 mg/dL   Creatinine, Ser 4.78 0.76 - 1.27 mg/dL   eGFR 87 >29 FA/OZH/0.86   BUN/Creatinine Ratio 11 10 - 24   Sodium 143 134 - 144 mmol/L   Potassium 4.0 3.5 - 5.2 mmol/L   Chloride 104 96 - 106 mmol/L   CO2 25 20 - 29 mmol/L   Calcium 9.5 8.6 - 10.2 mg/dL   Total Protein 6.6 6.0 - 8.5 g/dL   Albumin 4.4 3.8 - 4.8 g/dL   Globulin, Total 2.2 1.5 - 4.5 g/dL   Albumin/Globulin Ratio 2.0 1.2 - 2.2   Bilirubin Total 0.5 0.0 - 1.2 mg/dL   Alkaline Phosphatase 81 44 - 121 IU/L   AST 17 0 - 40 IU/L   ALT 17 0 - 44 IU/L  CBC w/Diff  Result Value Ref Range   WBC 5.8 3.4 - 10.8 x10E3/uL   RBC 4.26 4.14 - 5.80 x10E6/uL   Hemoglobin 13.5 13.0 - 17.7 g/dL   Hematocrit 57.8 46.9 - 51.0 %   MCV 95 79 - 97 fL   MCH 31.7 26.6 - 33.0 pg   MCHC 33.3 31.5 - 35.7 g/dL   RDW 62.9 52.8 - 41.3 %   Platelets 160 150 - 450 x10E3/uL   Neutrophils 54 Not Estab. %   Lymphs 32 Not Estab. %   Monocytes 9 Not Estab. %   Eos 4 Not Estab. %   Basos 1 Not Estab. %   Neutrophils Absolute 3.1 1.4 - 7.0 x10E3/uL   Lymphocytes Absolute 1.8 0.7 - 3.1 x10E3/uL   Monocytes Absolute 0.5 0.1 - 0.9 x10E3/uL   EOS (ABSOLUTE) 0.2 0.0 - 0.4 x10E3/uL   Basophils Absolute 0.0 0.0 - 0.2 x10E3/uL   Immature Granulocytes 0 Not Estab. %   Immature Grans (  Abs) 0.0 0.0 - 0.1 x10E3/uL  B12  Result Value Ref Range   Vitamin B-12 817 232 - 1,245 pg/mL  HgB A1c  Result Value Ref Range   Hgb A1c MFr Bld 6.0 (H) 4.8 - 5.6 %   Est. average glucose Bld gHb Est-mCnc 126 mg/dL  TSH  Result Value Ref Range   TSH 1.570 0.450 - 4.500  uIU/mL  T4, free  Result Value Ref Range   Free T4 1.47 0.82 - 1.77 ng/dL      Assessment & Plan:   Problem List Items Addressed This Visit   None Visit Diagnoses     Urticarial dermatitis    -  Primary   Acute, stable. Recommend use of Claritin or zyrtec for symptom relief instead of Benadryl. Provided zyrtec samples. F/u if no improvement.        Follow up plan: Return if symptoms worsen or fail to improve.

## 2022-08-31 NOTE — Patient Instructions (Signed)
Try Claritin Zyrtec Xyzal

## 2022-09-03 DIAGNOSIS — L821 Other seborrheic keratosis: Secondary | ICD-10-CM | POA: Diagnosis not present

## 2022-09-03 DIAGNOSIS — D3614 Benign neoplasm of peripheral nerves and autonomic nervous system of thorax: Secondary | ICD-10-CM | POA: Diagnosis not present

## 2022-09-03 DIAGNOSIS — D225 Melanocytic nevi of trunk: Secondary | ICD-10-CM | POA: Diagnosis not present

## 2022-09-07 ENCOUNTER — Ambulatory Visit: Payer: Self-pay

## 2022-09-07 DIAGNOSIS — L509 Urticaria, unspecified: Secondary | ICD-10-CM

## 2022-09-07 MED ORDER — PREDNISONE 10 MG PO TABS
ORAL_TABLET | ORAL | 0 refills | Status: DC
Start: 2022-09-07 — End: 2022-10-22

## 2022-09-07 NOTE — Addendum Note (Signed)
Addended by: Prescott Gum on: 09/07/2022 03:07 PM   Modules accepted: Orders

## 2022-09-07 NOTE — Addendum Note (Signed)
Addended by: Prescott Gum on: 09/07/2022 04:45 PM   Modules accepted: Orders

## 2022-09-07 NOTE — Telephone Encounter (Signed)
  Chief Complaint: raised rash with whelps to buttocks to waist  Symptoms: itching to rash and right eye itching  Pertinent Negatives: Patient denies joint pain fever Disposition: [] ED /[] Urgent Care (no appt availability in office) / [] Appointment(In office/virtual)/ []  [facility]/ [] Home Care/ [x] Refused Recommended Disposition /[] [place] [facility]/ []  Follow-up with PCP Additional Notes: pt stated that he was advised by provider at [facility] to call back if not better to get RX for steroids. Refused appt. Reason for Disposition  Localized rash present > 7 days  Answer Assessment - Initial Assessment Questions 1. APPEARANCE of RASH: "Describe the rash."      Raised areas, whelps 2. LOCATION: "Where is the rash located?"      Buttocks, to waist   5. ONSET: "When did the rash start?"      This am  6. ITCHING: "Does the rash itch?" If Yes, ask: "How bad is the itch?"  (Scale 0-10; or none, mild, moderate, severe)     Yes  7. OTHER SYMPTOMS: "Do you have any other symptoms?" (e.g., fever)     Left eye itching  Protocols used: Rash or Redness - Localized-A-AH

## 2022-09-10 ENCOUNTER — Other Ambulatory Visit: Payer: Self-pay | Admitting: Nurse Practitioner

## 2022-09-10 NOTE — Telephone Encounter (Signed)
Requested Prescriptions  Pending Prescriptions Disp Refills   levothyroxine (SYNTHROID) 125 MCG tablet [Pharmacy Med Name: LEVOTHYROXINE SODIUM 125 MCG Tablet] 90 tablet 2    Sig: TAKE 1 TABLET EVERY DAY     Endocrinology:  Hypothyroid Agents Passed - 09/10/2022  1:47 AM      Passed - TSH in normal range and within 360 days    TSH  Date Value Ref Range Status  04/21/2022 1.570 0.450 - 4.500 uIU/mL Final         Passed - Valid encounter within last 12 months    Recent Outpatient Visits           1 week ago Urticarial dermatitis   [place] [facility] Pearley, Sherran Needs, NP   4 months ago Primary hypertension   [place] [facility] Larae Grooms, NP   10 months ago Primary hypertension   [place] [facility] Larae Grooms, NP   11 months ago Abscess   [place] [facility] Larae Grooms, NP   11 months ago Abscess   [place] [facility] Larae Grooms, NP       Future Appointments             In 1 month Pearley, Sherran Needs, NP [place] [facility]

## 2022-10-12 DIAGNOSIS — H353221 Exudative age-related macular degeneration, left eye, with active choroidal neovascularization: Secondary | ICD-10-CM | POA: Diagnosis not present

## 2022-10-22 ENCOUNTER — Ambulatory Visit (INDEPENDENT_AMBULATORY_CARE_PROVIDER_SITE_OTHER): Payer: Medicare Other | Admitting: Family Medicine

## 2022-10-22 VITALS — BP 126/78 | HR 75 | Temp 98.0°F | Ht 67.0 in | Wt 223.0 lb

## 2022-10-22 DIAGNOSIS — E039 Hypothyroidism, unspecified: Secondary | ICD-10-CM

## 2022-10-22 DIAGNOSIS — R7303 Prediabetes: Secondary | ICD-10-CM

## 2022-10-22 DIAGNOSIS — I10 Essential (primary) hypertension: Secondary | ICD-10-CM | POA: Diagnosis not present

## 2022-10-22 MED ORDER — LEVOTHYROXINE SODIUM 125 MCG PO TABS: 125.0000 ug | ORAL_TABLET | Freq: Every day | ORAL | 2 refills | Status: DC

## 2022-10-22 MED ORDER — LOSARTAN POTASSIUM 100 MG PO TABS
100.0000 mg | ORAL_TABLET | Freq: Every day | ORAL | 1 refills | Status: DC
Start: 2022-10-22 — End: 2023-04-05

## 2022-10-22 NOTE — Assessment & Plan Note (Addendum)
A1C ordered at visit today.  Will make recommendations based on lab results.

## 2022-10-22 NOTE — Assessment & Plan Note (Signed)
Chronic.  Controlled.  Continue with current medication regimen of Levothyroxine.  Refills sent today.  Labs ordered today.  Return to clinic in 6 months for reevaluation.  Call sooner if concerns arise.

## 2022-10-22 NOTE — Assessment & Plan Note (Signed)
Chronic.  Controlled.  Continue with current medication regimen of Losartan 100mg .  Labs ordered today.  Return to clinic in 6 months for reevaluation.  Call sooner if concerns arise. Reviewed ASCVD with patient during visit. Will start Crestor 5mg  daily. Side effects and benefits of medication discussed.    The 10-year ASCVD risk score (Arnett DK, et al., 2019) is: 25.5%   Values used to calculate the score:     Age: 72 years     Sex: Male     Is Non-Hispanic African American: No     Diabetic: No     Tobacco smoker: No     Systolic Blood Pressure: 126 mmHg     Is BP treated: Yes     HDL Cholesterol: 47 mg/dL     Total Cholesterol: 176 mg/dL

## 2022-10-22 NOTE — Assessment & Plan Note (Addendum)
Controlled.  Compliant with med regimen, BP at goal today, encouraged to start checking BP at home and bringing in recording log. Continue with losartan 100 MG. Refills sent today.  Labs ordered. Follow up in 6 months.  Call sooner if concerns arise.

## 2022-10-22 NOTE — Patient Instructions (Addendum)
Increase your water intake to at least 4-5 water bottles daily   Consider Meals on wheels for cooking breaks on different days of the week  Start checking BP weekly and writing readings down  Start wearing compression socks for swelling 4-6 hours during the day  Start elevating legs daily above heart level to help with swelling

## 2022-10-22 NOTE — Progress Notes (Signed)
BP 126/78   Pulse 75   Temp 98 F (36.7 C) (Oral)   Ht 5\' 7"  (1.702 m)   Wt 223 lb (101.2 kg)   SpO2 95%   BMI 34.93 kg/m    Subjective:    Patient ID: Joshua [name], male    DOB: 1950-07-24, 72 y.o.   MRN: [id number]  HPI: Joshua [name] is a 72 y.o. male  Chief Complaint  Patient presents with   Hypertension   Hypothyroidism   HYPERTENSION / HYPERLIPIDEMIA Taking Losartan 100 MG.  Satisfied with current treatment? yes Duration of hypertension: years BP monitoring frequency: not checking BP range: None BP medication side effects: no Past BP meds: losartan (cozaar) Duration of hyperlipidemia: years Cholesterol medication side effects: no Cholesterol supplements: none Past cholesterol medications: none Medication compliance: excellent compliance Aspirin: no Recent stressors: no Recurrent headaches: no Visual changes: no Palpitations: no Dyspnea: no Chest pain: no Lower extremity edema: yes Dizzy/lightheaded: no  HYPOTHYROIDISM Taking synthroid 125 MCG daily  Thyroid control status:controlled Satisfied with current treatment? yes Medication side effects: no Medication compliance: excellent compliance Etiology of hypothyroidism:  Recent dose adjustment:no Fatigue: No Cold intolerance: no Heat intolerance: no Weight gain: no Weight loss: no Constipation: no Diarrhea/loose stools: no Palpitations: no Lower extremity edema: yes Anxiety/depressed mood: no  ANEMIA Taking daily B12 vitamin.  Anemia status: controlled Etiology of anemia: pernicious anemia Duration of anemia treatment: years Compliance with treatment: excellent compliance Iron supplementation side effects: no Severity of anemia: mild Fatigue: no Decreased exercise tolerance: no  Dyspnea on exertion: no Palpitations: no Bleeding: no Pica: no  Relevant past medical, surgical, family and social history reviewed and updated as indicated. Interim medical history since our last  visit reviewed. Allergies and medications reviewed and updated.  Review of Systems  Constitutional:  Negative for fatigue and unexpected weight change.  Eyes:  Negative for visual disturbance.  Respiratory:  Negative for chest tightness and shortness of [name].   Cardiovascular:  Positive for leg swelling. Negative for chest pain and palpitations.  Gastrointestinal:  Negative for constipation and diarrhea.  Endocrine: Negative for cold intolerance and heat intolerance.  Neurological:  Negative for dizziness, light-headedness and headaches.  Psychiatric/Behavioral:  Negative for dysphoric mood. The patient is not nervous/anxious.     Per HPI unless specifically indicated above     Objective:    BP 126/78   Pulse 75   Temp 98 F (36.7 C) (Oral)   Ht 5\' 7"  (1.702 m)   Wt 223 lb (101.2 kg)   SpO2 95%   BMI 34.93 kg/m   Wt Readings from Last 3 Encounters:  10/22/22 223 lb (101.2 kg)  08/31/22 219 lb 6.4 oz (99.5 kg)  08/30/22 217 lb (98.4 kg)    Physical Exam Vitals and nursing note reviewed.  Constitutional:      General: He is not in acute distress.    Appearance: Normal appearance. He is obese. He is not ill-appearing, toxic-appearing or diaphoretic.  HENT:     Head: Normocephalic.     Right Ear: External ear normal.     Left Ear: External ear normal.     Nose: Nose normal. No congestion or rhinorrhea.     Mouth/Throat:     Mouth: Mucous membranes are moist.  Eyes:     General:        Right eye: No discharge.        Left eye: No discharge.     Extraocular Movements: Extraocular movements  intact.     Conjunctiva/sclera: Conjunctivae normal.     Pupils: Pupils are equal, round, and reactive to light.  Cardiovascular:     Rate and Rhythm: Normal rate and regular rhythm.     Pulses:          Radial pulses are 2+ on the right side and 2+ on the left side.       Dorsalis pedis pulses are 2+ on the right side and 2+ on the left side.     Heart sounds: No murmur  heard. Pulmonary:     Effort: Pulmonary effort is normal. No respiratory distress.     [name] sounds: Normal [name] sounds. No wheezing, rhonchi or rales.  Abdominal:     General: Abdomen is flat. Bowel sounds are normal.  Musculoskeletal:     Cervical back: Normal range of motion and neck supple.     Right lower leg: 1+ Edema present.     Left lower leg: 1+ Edema present.  Skin:    General: Skin is warm and dry.     Capillary Refill: Capillary refill takes less than 2 seconds.  Neurological:     General: No focal deficit present.     Mental Status: He is alert and oriented to person, place, and time.  Psychiatric:        Mood and Affect: Mood normal.        Behavior: Behavior normal.        Thought Content: Thought content normal.        Judgment: Judgment normal.     Results for orders placed or performed in visit on 04/21/22  Comp Met (CMET)  Result Value Ref Range   Glucose 78 70 - 99 mg/dL   BUN 10 8 - 27 mg/dL   Creatinine, Ser 4.01 0.76 - 1.27 mg/dL   eGFR 87 >02 VO/ZDG/6.44   BUN/Creatinine Ratio 11 10 - 24   Sodium 143 134 - 144 mmol/L   Potassium 4.0 3.5 - 5.2 mmol/L   Chloride 104 96 - 106 mmol/L   CO2 25 20 - 29 mmol/L   Calcium 9.5 8.6 - 10.2 mg/dL   Total Protein 6.6 6.0 - 8.5 g/dL   Albumin 4.4 3.8 - 4.8 g/dL   Globulin, Total 2.2 1.5 - 4.5 g/dL   Albumin/Globulin Ratio 2.0 1.2 - 2.2   Bilirubin Total 0.5 0.0 - 1.2 mg/dL   Alkaline Phosphatase 81 44 - 121 IU/L   AST 17 0 - 40 IU/L   ALT 17 0 - 44 IU/L  CBC w/Diff  Result Value Ref Range   WBC 5.8 3.4 - 10.8 x10E3/uL   RBC 4.26 4.14 - 5.80 x10E6/uL   Hemoglobin 13.5 13.0 - 17.7 g/dL   Hematocrit 03.4 74.2 - 51.0 %   MCV 95 79 - 97 fL   MCH 31.7 26.6 - 33.0 pg   MCHC 33.3 31.5 - 35.7 g/dL   RDW 59.5 63.8 - 75.6 %   Platelets 160 150 - 450 x10E3/uL   Neutrophils 54 Not Estab. %   Lymphs 32 Not Estab. %   Monocytes 9 Not Estab. %   Eos 4 Not Estab. %   Basos 1 Not Estab. %   Neutrophils Absolute  3.1 1.4 - 7.0 x10E3/uL   Lymphocytes Absolute 1.8 0.7 - 3.1 x10E3/uL   Monocytes Absolute 0.5 0.1 - 0.9 x10E3/uL   EOS (ABSOLUTE) 0.2 0.0 - 0.4 x10E3/uL   Basophils Absolute 0.0 0.0 - 0.2 x10E3/uL  Immature Granulocytes 0 Not Estab. %   Immature Grans (Abs) 0.0 0.0 - 0.1 x10E3/uL  B12  Result Value Ref Range   Vitamin B-12 817 232 - 1,245 pg/mL  HgB A1c  Result Value Ref Range   Hgb A1c MFr Bld 6.0 (H) 4.8 - 5.6 %   Est. average glucose Bld gHb Est-mCnc 126 mg/dL  TSH  Result Value Ref Range   TSH 1.570 0.450 - 4.500 uIU/mL  T4, free  Result Value Ref Range   Free T4 1.47 0.82 - 1.77 ng/dL      Assessment & Plan:   Problem List Items Addressed This Visit     Benign essential HTN    Controlled.  Compliant with med regimen, BP at goal today, encouraged to start checking BP at home and bringing in recording log. Continue with losartan 100 MG. Refills sent today.  Labs ordered. Follow up in 6 months.  Call sooner if concerns arise.       Relevant Medications   losartan (COZAAR) 100 MG tablet   Other Relevant Orders   Comp Met (CMET)   Hypothyroidism - Primary    Chronic.  Controlled.  Continue with current medication regimen of Levothyroxine.  Refills sent today.  Labs ordered today.  Return to clinic in 6 months for reevaluation.  Call sooner if concerns arise.        Relevant Medications   levothyroxine (SYNTHROID) 125 MCG tablet   Prediabetes    A1C ordered at visit today.  Will make recommendations based on lab results.        Relevant Orders   HgB A1c      Follow up plan: Return in about 6 months (around 04/21/2023), or if symptoms worsen or fail to improve, for Follow up BP, prediabetes, thyroid.

## 2022-10-23 LAB — COMPREHENSIVE METABOLIC PANEL
ALT: 13 IU/L (ref 0–44)
AST: 17 IU/L (ref 0–40)
Albumin: 4 g/dL (ref 3.8–4.8)
Alkaline Phosphatase: 74 IU/L (ref 44–121)
BUN/Creatinine Ratio: 16 (ref 10–24)
BUN: 15 mg/dL (ref 8–27)
Bilirubin Total: 0.3 mg/dL (ref 0.0–1.2)
CO2: 25 mmol/L (ref 20–29)
Calcium: 9.2 mg/dL (ref 8.6–10.2)
Chloride: 105 mmol/L (ref 96–106)
Creatinine, Ser: 0.93 mg/dL (ref 0.76–1.27)
Globulin, Total: 2 g/dL (ref 1.5–4.5)
Glucose: 125 mg/dL — ABNORMAL HIGH (ref 70–99)
Potassium: 3.9 mmol/L (ref 3.5–5.2)
Sodium: 143 mmol/L (ref 134–144)
Total Protein: 6 g/dL (ref 6.0–8.5)
eGFR: 86 mL/min/{1.73_m2} (ref >59)

## 2022-10-23 LAB — HEMOGLOBIN A1C
Est. average glucose Bld gHb Est-mCnc: 126 mg/dL
Hgb A1c MFr Bld: 6 % — ABNORMAL HIGH (ref 4.8–5.6)

## 2022-10-25 NOTE — Progress Notes (Signed)
Hi Britt, Can you let Graison know his A1C remains stable at 6.0%, remaining in the pre diabetes range. His electrolytes, kidney function, and liver function remains stable and normal. Thank you!

## 2022-10-27 ENCOUNTER — Ambulatory Visit: Payer: Medicare Other

## 2022-10-27 DIAGNOSIS — Z719 Counseling, unspecified: Secondary | ICD-10-CM

## 2022-10-27 DIAGNOSIS — Z23 Encounter for immunization: Secondary | ICD-10-CM | POA: Diagnosis not present

## 2022-10-27 NOTE — Progress Notes (Signed)
Pt seen in home for Covid 19 and flu vaccines along with his wife.   Denies problem with past vaccines other than sore arm.  See immunization flow sheet and scanned vaccination administration record/questions.   VIS given for Covid 19 and flu vaccines.  Any questions answered.  Vaccines administered by Marin Roberts RN; tolerated well. Reviewed after vaccine care.   [facility] updated and copy given.  Cherlynn Polo, RN

## 2023-01-04 ENCOUNTER — Ambulatory Visit: Payer: Medicare Other | Admitting: Emergency Medicine

## 2023-01-04 VITALS — Ht 67.0 in | Wt 210.0 lb

## 2023-01-04 DIAGNOSIS — Z0000 Encounter for general adult medical examination without abnormal findings: Secondary | ICD-10-CM | POA: Diagnosis not present

## 2023-01-04 NOTE — Progress Notes (Signed)
Subjective:   Joshua [name] is a 72 y.o. male who presents for Medicare Annual/Subsequent preventive examination.  Visit Complete: Virtual I connected with  [name] on 01/04/23 by a audio enabled telemedicine application and verified that I am speaking with the correct person using two identifiers.  Patient Location: Home  Provider Location: Home Office  I discussed the limitations of evaluation and management by telemedicine. The patient expressed understanding and agreed to proceed.  Vital Signs: Because this visit was a virtual/telehealth visit, some criteria may be missing or patient reported. Any vitals not documented were not able to be obtained and vitals that have been documented are patient reported.   Cardiac Risk Factors include: advanced age (>81men, >26 women);male gender;hypertension;obesity (BMI >30kg/m2);Other (see comment), Risk factor comments: OSA (cpap), prediabetic     Objective:    Today's Vitals   01/04/23 1244 01/04/23 1245  Weight: 210 lb (95.3 kg)   Height: 5\' 7"  (1.702 m)   PainSc:  4    Body mass index is 32.89 kg/m.     01/04/2023    1:00 PM 07/07/2022    9:46 AM 01/06/2021    8:31 AM 10/31/2020   10:36 AM 05/29/2020    8:24 AM 07/09/2019   10:44 AM 06/28/2018    3:21 PM  Advanced Directives  Does Patient Have a Medical Advance Directive? No No No No No No No  Would patient like information on creating a medical advance directive? Yes (MAU/Ambulatory/Procedural Areas - Information given)    No - Patient declined Yes (Inpatient - patient defers creating a medical advance directive at this time - Information given) Yes (MAU/Ambulatory/Procedural Areas - Information given)    Current Medications (verified) Outpatient Encounter Medications as of 01/04/2023  Medication Sig   acetaminophen (TYLENOL) 500 MG tablet Take 500 mg by mouth every 6 (six) hours as needed.   cetirizine (ZYRTEC) 10 MG chewable tablet Chew 10 mg by mouth daily. Take  once a day   cyanocobalamin (VITAMIN B12) 1000 MCG tablet Take 1,000 mcg by mouth daily.   hydrocortisone 2.5 % lotion Apply topically.   levothyroxine (SYNTHROID) 125 MCG tablet Take 1 tablet (125 mcg total) by mouth daily.   losartan (COZAAR) 100 MG tablet Take 1 tablet (100 mg total) by mouth daily.   Multiple Vitamins-Minerals (EYE VITAMINS PO) Take by mouth.   augmented betamethasone dipropionate (DIPROLENE AF) 0.05 % cream Apply topically 2 (two) times daily. (Patient not taking: Reported on 01/04/2023)   ketoconazole (NIZORAL) 2 % shampoo Apply topically. (Patient not taking: Reported on 01/04/2023)   No facility-administered encounter medications on file as of 01/04/2023.    Allergies (verified) Aspirin, Celery oil, Flomax [tamsulosin hcl], Hydromorphone, Other, Oysters [shellfish allergy], Amlodipine besylate, and Latex   History: Past Medical History:  Diagnosis Date   Allergy    Amnesia 06/13/2014   B12 deficiency 06/13/2014   Benign essential HTN 10/30/2014   BPH (benign prostatic hyperplasia)    BPH with obstruction/lower urinary tract symptoms 05/15/2015   History of kidney stones    Hypogonadism in male    Hypothyroidism 10/30/2014   Past Surgical History:  Procedure Laterality Date   COLONOSCOPY  March 2016   COLONOSCOPY WITH PROPOFOL N/A 05/29/2020   Procedure: COLONOSCOPY WITH PROPOFOL;  Surgeon: Wyline Mood, MD;  Location: [facility] ENDOSCOPY;  Service: Gastroenterology;  Laterality: N/A;   COLONOSCOPY WITH PROPOFOL N/A 01/06/2021   Procedure: COLONOSCOPY WITH PROPOFOL;  Surgeon: Wyline Mood, MD;  Location: [facility] ENDOSCOPY;  Service: Gastroenterology;  Laterality: N/A;   COLONOSCOPY WITH PROPOFOL N/A 07/07/2022   Procedure: COLONOSCOPY WITH PROPOFOL;  Surgeon: Wyline Mood, MD;  Location: [facility] ENDOSCOPY;  Service: Gastroenterology;  Laterality: N/A;   ESOPHAGOGASTRODUODENOSCOPY ENDOSCOPY  March 2016   EYE SURGERY Bilateral    cataracts   KNEE SURGERY Right    SHOULDER SURGERY  Left    TONSILLECTOMY     Family History  Problem Relation Age of Onset   Breast cancer Mother    COPD Father    Heart disease Father    Prostate cancer Father    Social History   Socioeconomic History   Marital status: Married    Spouse name: Bonita Quin   Number of children: 2   Years of education: Not on file   Highest education level: High school graduate  Occupational History   Occupation: retired  Tobacco Use   Smoking status: Never   Smokeless tobacco: Never  Vaping Use   Vaping status: Never Used  Substance and Sexual Activity   Alcohol use: Yes    Comment: swig of wine monthly or less   Drug use: No   Sexual activity: Not Currently  Other Topics Concern   Not on file  Social History Narrative   Not on file   Social Determinants of Health   Financial Resource Strain: Low Risk  (01/04/2023)   Overall Financial Resource Strain (CARDIA)    Difficulty of Paying Living Expenses: Not hard at all  Food Insecurity: No Food Insecurity (01/04/2023)   Hunger Vital Sign    Worried About Running Out of Food in the Last Year: Never true    Ran Out of Food in the Last Year: Never true  Transportation Needs: No Transportation Needs (01/04/2023)   PRAPARE - Administrator, Civil Service (Medical): No    Lack of Transportation (Non-Medical): No  Physical Activity: Inactive (01/04/2023)   Exercise Vital Sign    Days of Exercise per Week: 0 days    Minutes of Exercise per Session: 0 min  Stress: No Stress Concern Present (01/04/2023)   [employer] of Occupational Health - Occupational Stress Questionnaire    Feeling of Stress : Only a little  Social Connections: Moderately Isolated (01/04/2023)   Social Connection and Isolation Panel [NHANES]    Frequency of Communication with Friends and Family: More than three times a week    Frequency of Social Gatherings with Friends and Family: Three times a week    Attends Religious Services: Never    Active Member of Clubs  or Organizations: No    Attends Banker Meetings: Never    Marital Status: Married    Tobacco Counseling Counseling given: Not Answered   Clinical Intake:  Pre-visit preparation completed: Yes  Pain : 0-10 Pain Score: 4  Pain Type: Chronic pain Pain Location: Knee Pain Orientation: Left, Right Pain Descriptors / Indicators: Aching     BMI - recorded: 32.89 Nutritional Status: BMI > 30  Obese Nutritional Risks: None Diabetes: No  How often do you need to have someone help you when you read instructions, pamphlets, or other written materials from your doctor or pharmacy?: 1 - Never What is the last grade level you completed in school?: 12th  Interpreter Needed?: No  Information entered by :: Tora Kindred, CMA   Activities of Daily Living    01/04/2023   12:48 PM  In your present state of health, do you have any difficulty performing the following activities:  Hearing? 1  Comment wears hearing aids  Vision? 0  Difficulty concentrating or making decisions? 0  Walking or climbing stairs? 1  Comment chronic knee pain  Dressing or bathing? 0  Doing errands, shopping? 0  Preparing Food and eating ? N  Using the Toilet? N  In the past six months, have you accidently leaked urine? N  Do you have problems with loss of bowel control? N  Managing your Medications? N  Managing your Finances? N  Housekeeping or managing your Housekeeping? N    Patient Care Team: Larae Grooms, NP as PCP - General (Nurse Practitioner) Murlean Hark, MD as Referring Physician (Orthopedic Surgery) Bud Face, MD as Referring Physician (Otolaryngology) Wyline Mood, MD as Consulting Physician (Gastroenterology)  Indicate any recent Medical Services you may have received from other than [facility] providers in the past year (date may be approximate).     Assessment:   This is a routine wellness examination for Skylur.  Hearing/Vision screen Hearing Screening -  Comments:: Wears hearing aids Vision Screening - Comments:: Gets eye exams   Goals Addressed               This Visit's Progress     DIET - INCREASE WATER INTAKE (pt-stated)        Depression Screen    01/04/2023   12:57 PM 10/22/2022    1:39 PM 04/21/2022    1:58 PM 11/02/2021   11:02 AM 11/02/2021   11:01 AM 11/02/2021   10:47 AM 10/21/2021    3:16 PM  PHQ 2/9 Scores  PHQ - 2 Score 0 0 0 0 0 0 0  PHQ- 9 Score 0 0 0 2 2 2  0    Fall Risk    01/04/2023    1:01 PM 04/21/2022    1:57 PM 11/02/2021   10:41 AM 10/21/2021    3:16 PM 09/28/2021   10:47 AM  Fall Risk   Falls in the past year? 1 0 1 0 1  Number falls in past yr: 0 0 0 0 1  Injury with Fall? 0 0 0 0 0  Risk for fall due to : History of fall(s);Impaired balance/gait;Orthopedic patient No Fall Risks  No Fall Risks History of fall(s)  Follow up Education provided;Falls prevention discussed;Falls evaluation completed Falls evaluation completed Falls evaluation completed;Education provided;Falls prevention discussed Falls evaluation completed Falls evaluation completed    MEDICARE RISK AT HOME: Medicare Risk at Home Any stairs in or around the home?: Yes If so, are there any without handrails?: No Home free of loose throw rugs in walkways, pet beds, electrical cords, etc?: Yes Adequate lighting in your home to reduce risk of falls?: Yes Life alert?: No Use of a cane, walker or w/c?: No Grab bars in the bathroom?: Yes Shower chair or bench in shower?: Yes Elevated toilet seat or a handicapped toilet?: Yes  TIMED UP AND GO:  Was the test performed?  No    Cognitive Function:        01/04/2023    1:03 PM 11/02/2021   10:43 AM 10/31/2020   10:43 AM 07/09/2019   10:50 AM 06/28/2018    3:21 PM  6CIT Screen  What Year? 0 points 0 points 0 points 0 points 0 points  What month? 0 points 0 points 0 points 0 points 0 points  What time? 0 points 0 points 0 points 0 points 0 points  Count back from 20 0 points 0 points 0  points 0 points 0  points  Months in reverse 0 points 4 points 2 points 4 points 0 points  Repeat phrase 0 points 2 points 4 points 0 points 0 points  Total Score 0 points 6 points 6 points 4 points 0 points    Immunizations Immunization History  Administered Date(s) Administered   Fluad Quad(high Dose 65+) 11/20/2020   Influenza, High Dose Seasonal PF 10/30/2014, 11/06/2015, 11/08/2016, 10/31/2017, 11/21/2019, 11/17/2021, 10/27/2022   Influenza,inj,Quad PF,6+ Mos 10/30/2014, 11/01/2018   PFIZER Comirnaty(Gray Top)Covid-19 Tri-Sucrose Vaccine 11/21/2019   PFIZER(Purple Top)SARS-COV-2 Vaccination 04/02/2019, 04/23/2019   Pfizer(Comirnaty)Fall Seasonal Vaccine 12 years and older 11/17/2021, 10/27/2022   Pneumococcal Conjugate-13 10/29/2013   Pneumococcal Polysaccharide-23 06/23/2017   Td 10/11/2007, 02/20/2018   Zoster, Live 02/24/2011    TDAP status: Up to date  Flu Vaccine status: Up to date  Pneumococcal vaccine status: Up to date  Covid-19 vaccine status: Completed vaccines  Qualifies for Shingles Vaccine? Yes   Zostavax completed Yes   Shingrix Completed?: No.    Education has been provided regarding the importance of this vaccine. Patient has been advised to call insurance company to determine out of pocket expense if they have not yet received this vaccine. Advised may also receive vaccine at local pharmacy or Health Dept. Verbalized acceptance and understanding. Patient declined  Screening Tests Health Maintenance  Topic Date Due   Zoster Vaccines- Shingrix (1 of 2) 04/24/1998   Medicare Annual Wellness (AWV)  01/04/2024   Colonoscopy  07/06/2025   DTaP/Tdap/Td (3 - Tdap) 02/21/2028   Pneumonia Vaccine 95+ Years old  Completed   INFLUENZA VACCINE  Completed   COVID-19 Vaccine  Completed   Hepatitis C Screening  Completed   HPV VACCINES  Aged Out    Health Maintenance  Health Maintenance Due  Topic Date Due   Zoster Vaccines- Shingrix (1 of 2) 04/24/1998     Colorectal cancer screening: Type of screening: Colonoscopy. Completed 07/07/22. Repeat every 3 years  Lung Cancer Screening: (Low Dose CT Chest recommended if Age 79-80 years, 20 pack-year currently smoking OR have quit w/in 15years.) does not qualify.   Lung Cancer Screening Referral: n/a  Additional Screening:  Hepatitis C Screening: does not qualify; Completed 06/23/17  Vision Screening: Recommended annual ophthalmology exams for early detection of glaucoma and other disorders of the eye.  Dental Screening: Recommended annual dental exams for proper oral hygiene   Community Resource Referral / Chronic Care Management: CRR required this visit?  No   CCM required this visit?  No     Plan:     I have personally reviewed and noted the following in the patient's chart:   Medical and social history Use of alcohol, tobacco or illicit drugs  Current medications and supplements including opioid prescriptions. Patient is not currently taking opioid prescriptions. Functional ability and status Nutritional status Physical activity Advanced directives List of other physicians Hospitalizations, surgeries, and ER visits in previous 12 months Vitals Screenings to include cognitive, depression, and falls Referrals and appointments  In addition, I have reviewed and discussed with patient certain preventive protocols, quality metrics, and best practice recommendations. A written personalized care plan for preventive services as well as general preventive health recommendations were provided to patient.     Tora Kindred, CMA   01/04/2023   After Visit Summary: (Mail) Due to this being a telephonic visit, the after visit summary with patients personalized plan was offered to patient via mail   Nurse Notes:  Declined Shingrix vaccine

## 2023-01-04 NOTE — Patient Instructions (Addendum)
Joshua [name] , Thank you for taking time to come for your Medicare Wellness Visit. I appreciate your ongoing commitment to your health goals. Please review the following plan we discussed and let me know if I can assist you in the future.   Referrals/Orders/Follow-Ups/Clinician Recommendations: Keep up the good work!!  This is a list of the screening recommended for you and due dates:  Health Maintenance  Topic Date Due   Zoster (Shingles) Vaccine (1 of 2) 04/24/1998   Medicare Annual Wellness Visit  01/04/2024   Colon Cancer Screening  07/06/2025   DTaP/Tdap/Td vaccine (3 - Tdap) 02/21/2028   Pneumonia Vaccine  Completed   Flu Shot  Completed   COVID-19 Vaccine  Completed   Hepatitis C Screening  Completed   HPV Vaccine  Aged Out    Advanced directives: (Provided) Advance directive discussed with you today. I have provided a copy for you to complete at home and have notarized. Once this is complete, please bring a copy in to our office so we can scan it into your chart.   Next Medicare Annual Wellness Visit scheduled for next year: Yes, 01/10/24 @ 10:40am  Fall Prevention in the Home, Adult Falls can cause injuries and affect people of all ages. There are many simple things that you can do to make your home safe and to help prevent falls. If you need it, ask for help making these changes. What actions can I take to prevent falls? General information Use good lighting in all rooms. Make sure to: Replace any light bulbs that burn out. Turn on lights if it is dark and use night-lights. Keep items that you use often in easy-to-reach places. Lower the shelves around your home if needed. Move furniture so that there are clear paths around it. Do not keep throw rugs or other things on the floor that can make you trip. If any of your floors are uneven, fix them. Add color or contrast paint or tape to clearly mark and help you see: Grab bars or handrails. First and last steps of  staircases. Where the edge of each step is. If you use a ladder or stepladder: Make sure that it is fully opened. Do not climb a closed ladder. Make sure the sides of the ladder are locked in place. Have someone hold the ladder while you use it. Know where your pets are as you move through your home. What can I do in the bathroom?     Keep the floor dry. Clean up any water that is on the floor right away. Remove soap buildup in the bathtub or shower. Buildup makes bathtubs and showers slippery. Use non-skid mats or decals on the floor of the bathtub or shower. Attach bath mats securely with double-sided, non-slip rug tape. If you need to sit down while you are in the shower, use a non-slip stool. Install grab bars by the toilet and in the bathtub and shower. Do not use towel bars as grab bars. What can I do in the bedroom? Make sure that you have a light by your bed that is easy to reach. Do not use any sheets or blankets on your bed that hang to the floor. Have a firm bench or chair with side arms that you can use for support when you get dressed. What can I do in the kitchen? Clean up any spills right away. If you need to reach something above you, use a sturdy step stool that has a grab bar.  Keep electrical cables out of the way. Do not use floor polish or wax that makes floors slippery. What can I do with my stairs? Do not leave anything on the stairs. Make sure that you have a light switch at the top and the bottom of the stairs. Have them installed if you do not have them. Make sure that there are handrails on both sides of the stairs. Fix handrails that are broken or loose. Make sure that handrails are as long as the staircases. Install non-slip stair treads on all stairs in your home if they do not have carpet. Avoid having throw rugs at the top or bottom of stairs, or secure the rugs with carpet tape to prevent them from moving. Choose a carpet design that does not hide the  edge of steps on the stairs. Make sure that carpet is firmly attached to the stairs. Fix any carpet that is loose or worn. What can I do on the outside of my home? Use bright outdoor lighting. Repair the edges of walkways and driveways and fix any cracks. Clear paths of anything that can make you trip, such as tools or rocks. Add color or contrast paint or tape to clearly mark and help you see high doorway thresholds. Trim any bushes or trees on the main path into your home. Check that handrails are securely fastened and in good repair. Both sides of all steps should have handrails. Install guardrails along the edges of any raised decks or porches. Have leaves, snow, and ice cleared regularly. Use sand, salt, or ice melt on walkways during winter months if you live where there is ice and snow. In the garage, clean up any spills right away, including grease or oil spills. What other actions can I take? Review your medicines with your health care provider. Some medicines can make you confused or feel dizzy. This can increase your chance of falling. Wear closed-toe shoes that fit well and support your feet. Wear shoes that have rubber soles and low heels. Use a cane, walker, scooter, or crutches that help you move around if needed. Talk with your provider about other ways that you can decrease your risk of falls. This may include seeing a physical therapist to learn to do exercises to improve movement and strength. Where to find more information Centers for Disease Control and Prevention, [employer]: TonerPromos.no [employer] on Aging: BaseRingTones.pl National Institute on Aging: BaseRingTones.pl Contact a health care provider if: You are afraid of falling at home. You feel weak, drowsy, or dizzy at home. You fall at home. Get help right away if you: Lose consciousness or have trouble moving after a fall. Have a fall that causes a head injury. These symptoms may be an emergency. Get help right away. Call  911. Do not wait to see if the symptoms will go away. Do not drive yourself to the hospital. This information is not intended to replace advice given to you by your health care provider. Make sure you discuss any questions you have with your health care provider. Document Revised: 09/21/2021 Document Reviewed: 09/21/2021 Elsevier Patient Education  2024 [employer].

## 2023-02-01 DIAGNOSIS — H353221 Exudative age-related macular degeneration, left eye, with active choroidal neovascularization: Secondary | ICD-10-CM | POA: Diagnosis not present

## 2023-02-01 DIAGNOSIS — H35432 Paving stone degeneration of retina, left eye: Secondary | ICD-10-CM | POA: Diagnosis not present

## 2023-02-01 DIAGNOSIS — H35033 Hypertensive retinopathy, bilateral: Secondary | ICD-10-CM | POA: Diagnosis not present

## 2023-03-19 IMAGING — DX DG SHOULDER 2+V*R*
3 series · 3 of 3 positions shown · non-contrast
Comparison: None.

CLINICAL DATA: Right shoulder pain chronically.

EXAM:
RIGHT SHOULDER - 2+ VIEW

[shoulder ap]
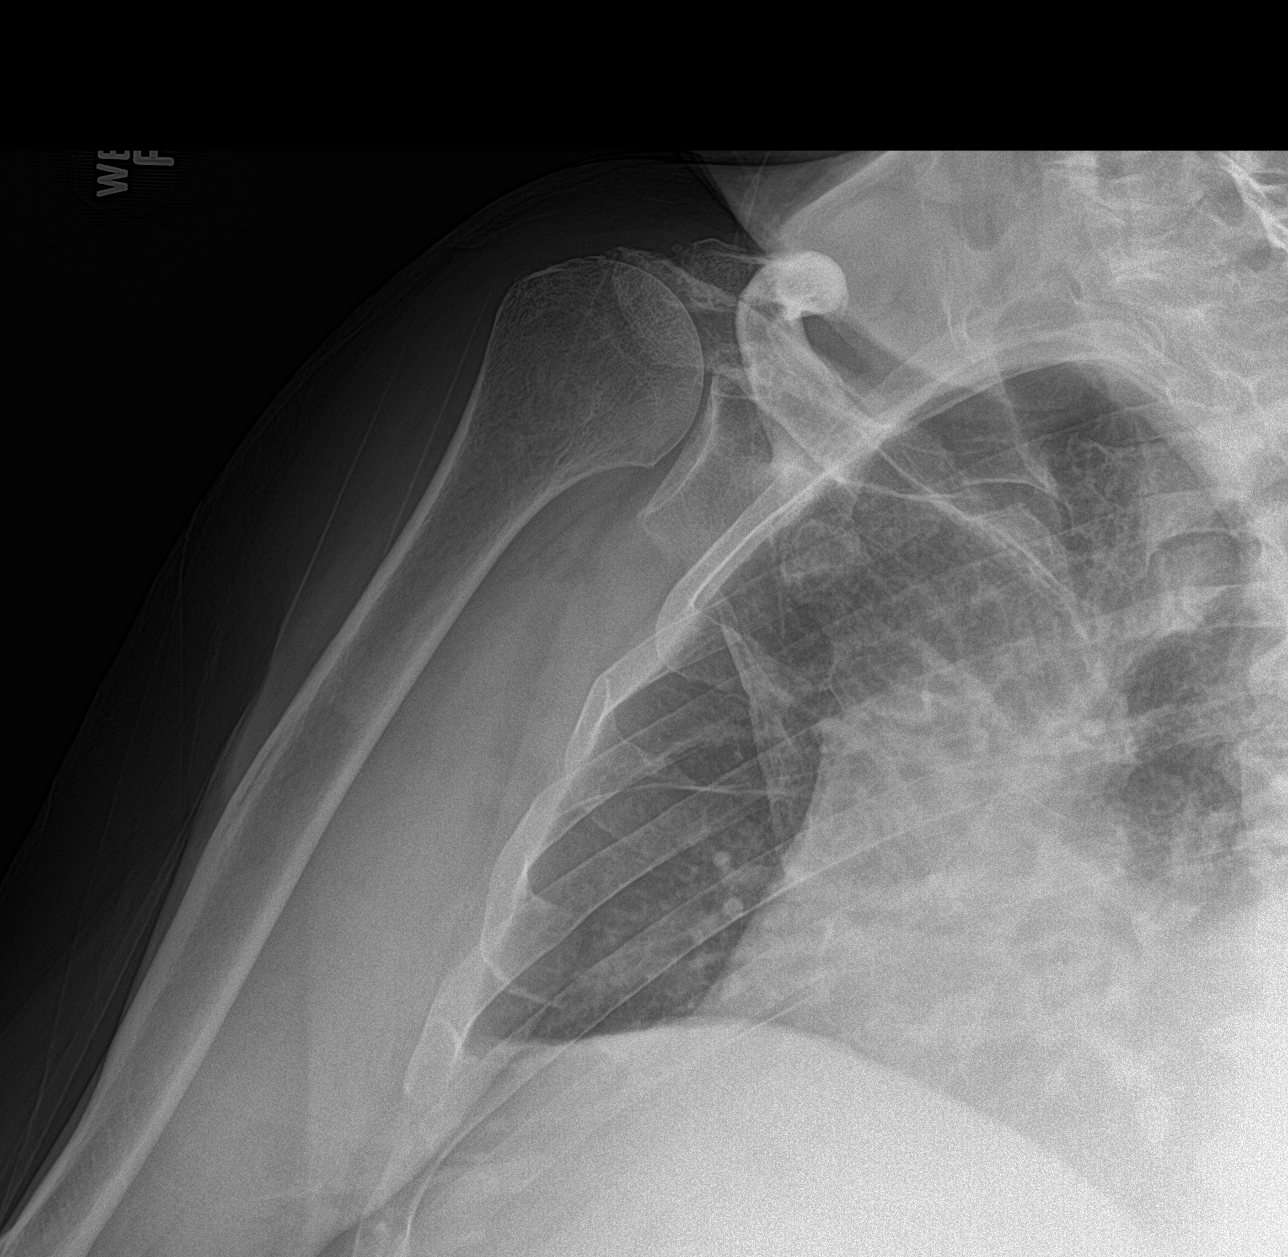

[shoulder y-view]
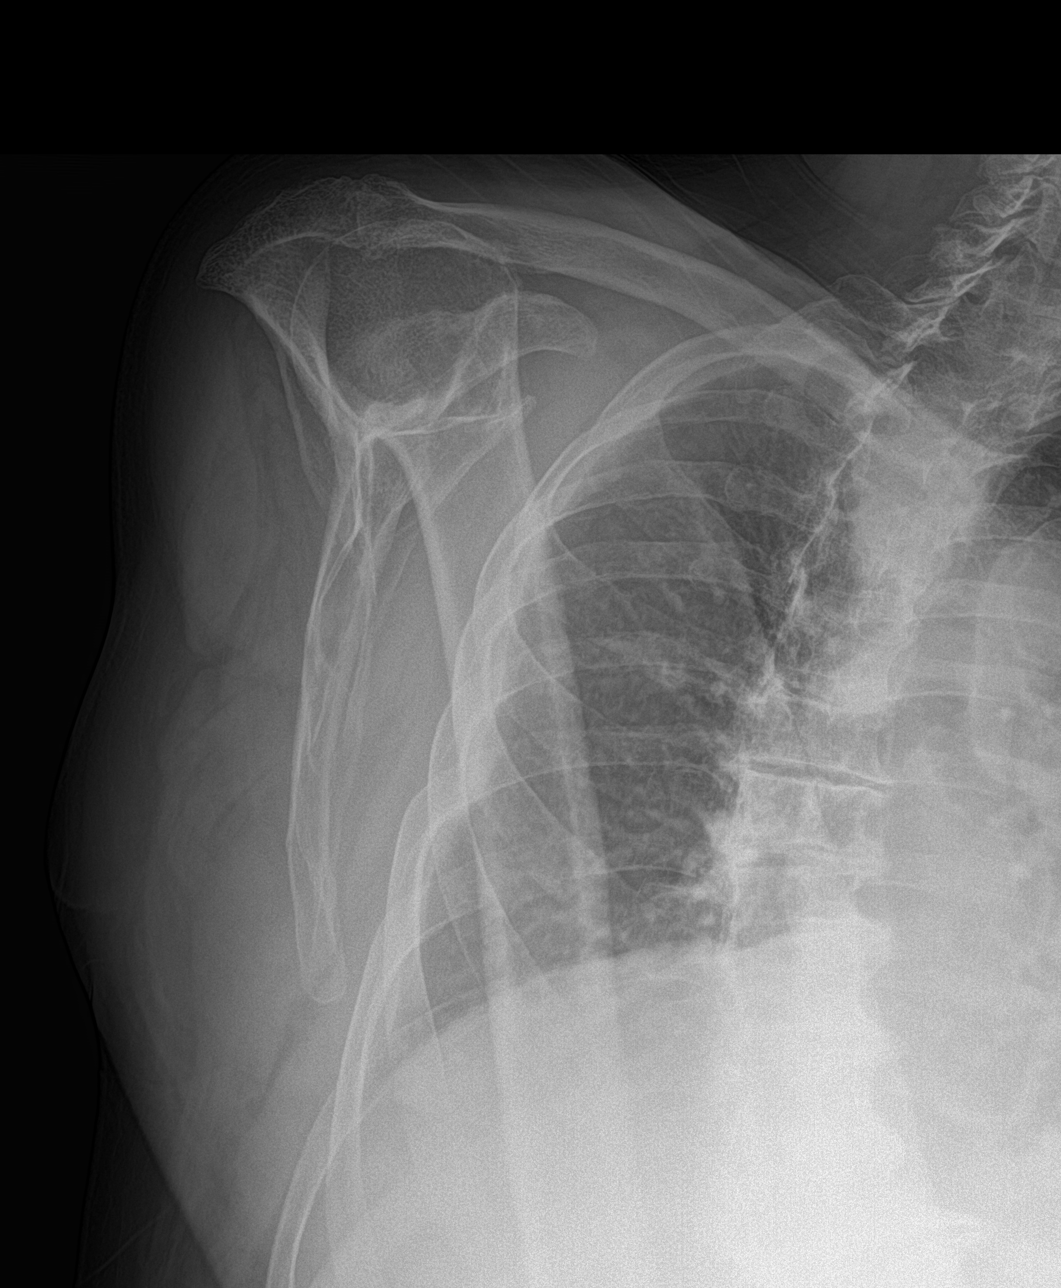

[shoulder axial]
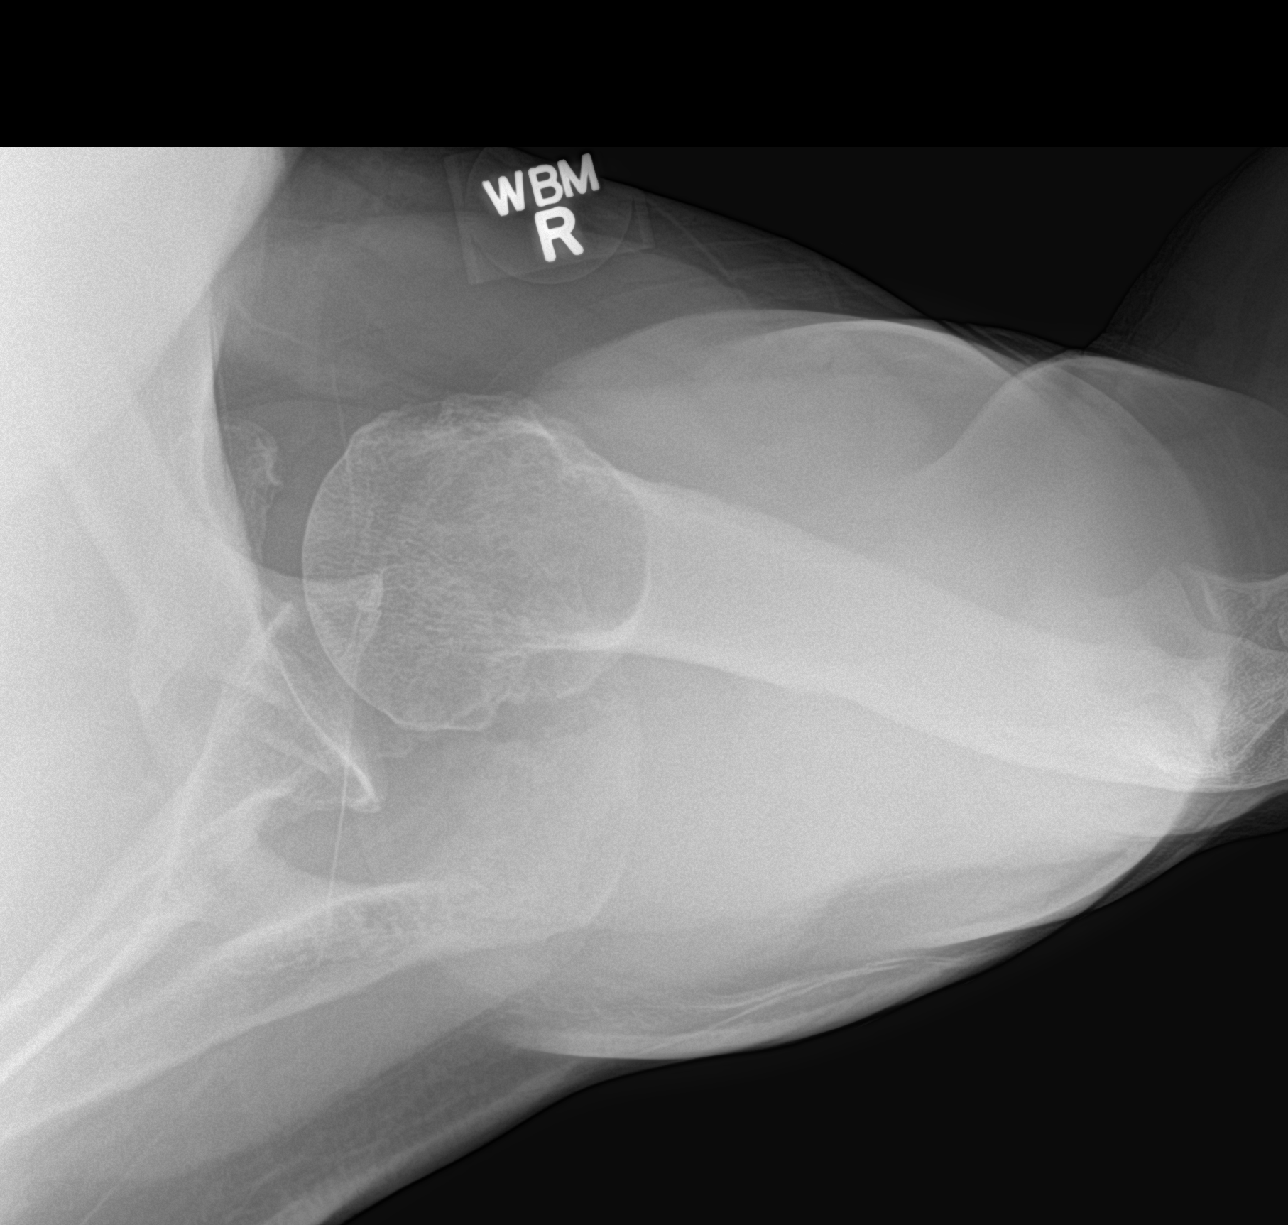

[3 of 3 positions shown; findings below may reference images not displayed]

FINDINGS: Mild degenerative changes over the AC joint and glenohumeral joints.
No acute fracture or dislocation. No focal bony abnormality.
IMPRESSION: 1. No acute findings.
2. Mild degenerative changes.

## 2023-03-30 ENCOUNTER — Ambulatory Visit: Payer: Self-pay | Admitting: Nurse Practitioner

## 2023-03-30 ENCOUNTER — Ambulatory Visit (INDEPENDENT_AMBULATORY_CARE_PROVIDER_SITE_OTHER): Payer: Medicare Other | Admitting: Nurse Practitioner

## 2023-03-30 ENCOUNTER — Encounter: Payer: Self-pay | Admitting: Nurse Practitioner

## 2023-03-30 VITALS — BP 148/80 | HR 85 | Ht 67.0 in | Wt 210.0 lb

## 2023-03-30 DIAGNOSIS — N138 Other obstructive and reflux uropathy: Secondary | ICD-10-CM

## 2023-03-30 DIAGNOSIS — Z636 Dependent relative needing care at home: Secondary | ICD-10-CM

## 2023-03-30 DIAGNOSIS — N401 Enlarged prostate with lower urinary tract symptoms: Secondary | ICD-10-CM

## 2023-03-30 DIAGNOSIS — R300 Dysuria: Secondary | ICD-10-CM | POA: Diagnosis not present

## 2023-03-30 LAB — URINALYSIS, ROUTINE W REFLEX MICROSCOPIC
Bilirubin, UA: NEGATIVE
Glucose, UA: NEGATIVE
Ketones, UA: NEGATIVE
Leukocytes,UA: NEGATIVE
Nitrite, UA: NEGATIVE
Protein,UA: NEGATIVE
RBC, UA: NEGATIVE
Specific Gravity, UA: 1.01 (ref 1.005–1.030)
Urobilinogen, Ur: 0.2 mg/dL (ref 0.2–1.0)
pH, UA: 6 (ref 5.0–7.5)

## 2023-03-30 MED ORDER — CITALOPRAM HYDROBROMIDE 10 MG PO TABS: 10.0000 mg | ORAL_TABLET | Freq: Every day | ORAL | 0 refills | Status: DC

## 2023-03-30 NOTE — Telephone Encounter (Signed)
 Copied from CRM [id number]. Topic: Clinical - Red Word Triage >> Mar 30, 2023 12:23 PM Albin Felling L wrote: Red Word that prompted transfer to Nurse Triage: Frequent sensation of urinating & unable to urinate.  Chief Complaint: urinary frequency and stated he is urinating a few drops each time Symptoms: not acting at his baseline forgetful Frequency: unsure possible a week Pertinent Negatives: Patient denies pain Disposition: [] ED /[] Urgent Care (no appt availability in office) / [x] Appointment(In office/virtual)/ []  [facility]/ [] Home Care/ [] Refused Recommended Disposition /[]  Mobile Bus/ []  Follow-up with PCP Additional Notes:   Reason for Disposition  Urinating more frequently than usual (i.e., frequency)  Answer Assessment - Initial Assessment Questions 1. SYMPTOM: "What's the main symptom you're concerned about?" (e.g., frequency, incontinence)     Frequency, unable to urinate except for a few drops  2. ONSET: "When did the  sx  start?"     At a week 3. PAIN: "Is there any pain?" If Yes, ask: "How bad is it?" (Scale: 1-10; mild, moderate, severe)     no 4. CAUSE: "What do you think is causing the symptoms?"     Not sure 5. OTHER SYMPTOMS: "Do you have any other symptoms?" (e.g., blood in urine, fever, flank pain, pain with urination)     no 6. PREGNANCY: "Is there any chance you are pregnant?" "When was your last menstrual period?"     N/a  Protocols used: Urinary Symptoms-A-AH

## 2023-03-30 NOTE — Progress Notes (Unsigned)
 BP (!) 148/80 (BP Location: Left Arm, Patient Position: Sitting, Cuff Size: Large)   Pulse 85   Ht 5\' 7"  (1.702 m)   Wt 210 lb (95.3 kg)   SpO2 97%   BMI 32.89 kg/m    Subjective:    Patient ID: Joshua [name], male    DOB: 12-14-1950, 73 y.o.   MRN: [id number]  HPI: Joshua [name] is a 73 y.o. male  Chief Complaint  Patient presents with   Urinary Tract Infection    Frequency urinating, lower back , feels like not empty the bladder--5 months. Anxiety/stress meds.  FYI pt unable to use the bathroom yet and been crying due to finding out wife had stage 5 from the daughter.   BPH BPH status: uncontrolled Satisfied with current treatment?: no Medication side effects: no Medication compliance: excellent compliance Duration: months Nocturia: 2-3x per night Urinary frequency:yes Incomplete voiding: no Urgency: yes Weak urinary stream: no Straining to start stream: yes Dysuria: no Onset: gradual Severity: severe  MOOD Patient states his wife is not doing well.  She is currently dying from Parkinsons.  He feels like he is ready start some medication.  His wife is seeing things.  He doesn't get out of the house.  His wife will call him whenever he leaves and want him to come right back.     Relevant past medical, surgical, family and social history reviewed and updated as indicated. Interim medical history since our last visit reviewed. Allergies and medications reviewed and updated.  Review of Systems  Per HPI unless specifically indicated above     Objective:    BP (!) 148/80 (BP Location: Left Arm, Patient Position: Sitting, Cuff Size: Large)   Pulse 85   Ht 5\' 7"  (1.702 m)   Wt 210 lb (95.3 kg)   SpO2 97%   BMI 32.89 kg/m   Wt Readings from Last 3 Encounters:  03/30/23 210 lb (95.3 kg)  01/04/23 210 lb (95.3 kg)  10/22/22 223 lb (101.2 kg)    Physical Exam  Results for orders placed or performed in visit on 10/22/22  HgB A1c   Collection Time:  10/22/22  2:17 PM  Result Value Ref Range   Hgb A1c MFr Bld 6.0 (H) 4.8 - 5.6 %   Est. average glucose Bld gHb Est-mCnc 126 mg/dL  Comp Met (CMET)   Collection Time: 10/22/22  2:17 PM  Result Value Ref Range   Glucose 125 (H) 70 - 99 mg/dL   BUN 15 8 - 27 mg/dL   Creatinine, Ser 5.78 0.76 - 1.27 mg/dL   eGFR 86 >46 NG/EXB/2.84   BUN/Creatinine Ratio 16 10 - 24   Sodium 143 134 - 144 mmol/L   Potassium 3.9 3.5 - 5.2 mmol/L   Chloride 105 96 - 106 mmol/L   CO2 25 20 - 29 mmol/L   Calcium 9.2 8.6 - 10.2 mg/dL   Total Protein 6.0 6.0 - 8.5 g/dL   Albumin 4.0 3.8 - 4.8 g/dL   Globulin, Total 2.0 1.5 - 4.5 g/dL   Bilirubin Total 0.3 0.0 - 1.2 mg/dL   Alkaline Phosphatase 74 44 - 121 IU/L   AST 17 0 - 40 IU/L   ALT 13 0 - 44 IU/L      Assessment & Plan:   Problem List Items Addressed This Visit   None Visit Diagnoses       Dysuria    -  Primary   Relevant Orders   Urinalysis,  Routine w reflex microscopic        Follow up plan: No follow-ups on file.

## 2023-03-31 NOTE — Assessment & Plan Note (Signed)
 Symptoms have worsened with several episodes of night time awakenings to go to the bathroom.  He is going very frequently during the day also.  Not able to take flomax due to allergy.  Has an appt with Urology on March 5.

## 2023-03-31 NOTE — Assessment & Plan Note (Signed)
 Chronic.  Has been caring for wife with Parkinsons for several years- she is now in her later stages which was been more difficult for him.  Finds that he is crying a lot.  He is ready to start medication.  Will start Celexa 10mg  daily.  Side effects and benefits of medication discussed during visit.  Follow up in 1 month.  Call sooner if concerns arise.

## 2023-04-01 DIAGNOSIS — M9905 Segmental and somatic dysfunction of pelvic region: Secondary | ICD-10-CM | POA: Diagnosis not present

## 2023-04-01 DIAGNOSIS — M5136 Other intervertebral disc degeneration, lumbar region with discogenic back pain only: Secondary | ICD-10-CM | POA: Diagnosis not present

## 2023-04-01 DIAGNOSIS — M9903 Segmental and somatic dysfunction of lumbar region: Secondary | ICD-10-CM | POA: Diagnosis not present

## 2023-04-01 DIAGNOSIS — M5432 Sciatica, left side: Secondary | ICD-10-CM | POA: Diagnosis not present

## 2023-04-05 ENCOUNTER — Other Ambulatory Visit: Payer: Self-pay

## 2023-04-05 DIAGNOSIS — I10 Essential (primary) hypertension: Secondary | ICD-10-CM

## 2023-04-05 MED ORDER — LOSARTAN POTASSIUM 100 MG PO TABS
100.0000 mg | ORAL_TABLET | Freq: Every day | ORAL | 1 refills | Status: DC
Start: 2023-04-05 — End: 2023-10-06

## 2023-04-05 NOTE — Progress Notes (Unsigned)
 04/06/2023 3:43 PM   [name] [birth year] [id number]  Referring provider: Larae Grooms, NP [address] [place],  [place] [address]  Urological history: 1. Urethral stricture -per patient several years ago -cysto 2017 - NED   2. BPH with LU TS -PSA 2.5 in 10/2019   3. ED -contributing factors of age, hypothyroidism, HLD and HTN  Chief Complaint  Patient presents with   Follow-up   HPI: Joshua [name] is a 73 y.o. male who presents today for post void drip.   Previous records reviewed.   I PSS 19/5  PVR 17 mL   He is having daytime frequency.  He states when he urinates in about 15 to 20 minutes he has to turn around and go right back to it.  This has been going on for several months.  Patient denies any modifying or aggravating factors.  Patient denies any recent UTI's, gross hematuria, dysuria or suprapubic/flank pain.  Patient denies any fevers, chills, nausea or vomiting.     IPSS     Row Name 04/06/23 1400         International Prostate Symptom Score   How often have you had the sensation of not emptying your bladder? More than half the time     How often have you had to urinate less than every two hours? More than half the time     How often have you found you stopped and started again several times when you urinated? Less than half the time     How often have you found it difficult to postpone urination? Almost always     How often have you had a weak urinary stream? Less than 1 in 5 times     How often have you had to strain to start urination? Less than half the time     How many times did you typically get up at night to urinate? 3 Times     Total IPSS Score 21       Quality of Life due to urinary symptoms   If you were to spend the rest of your life with your urinary condition just the way it is now how would you feel about that? Unhappy              Score:  1-7 Mild 8-19 Moderate 20-35 Severe     PMH: Past Medical  History:  Diagnosis Date   Allergy    Amnesia 06/13/2014   B12 deficiency 06/13/2014   Benign essential HTN 10/30/2014   BPH (benign prostatic hyperplasia)    BPH with obstruction/lower urinary tract symptoms 05/15/2015   History of kidney stones    Hypogonadism in male    Hypothyroidism 10/30/2014    Surgical History: Past Surgical History:  Procedure Laterality Date   COLONOSCOPY  March 2016   COLONOSCOPY WITH PROPOFOL N/A 05/29/2020   Procedure: COLONOSCOPY WITH PROPOFOL;  Surgeon: Wyline Mood, MD;  Location[facility] ENDOSCOPY;  Service: Gastroenterology;  Laterality: N/A;   COLONOSCOPY WITH PROPOFOL N/A 01/06/2021   Procedure: COLONOSCOPY WITH PROPOFOL;  Surgeon: Wyline Mood, MD;  Location: [facility] ENDOSCOPY;  Service: Gastroenterology;  Laterality: N/A;   COLONOSCOPY WITH PROPOFOL N/A 07/07/2022   Procedure: COLONOSCOPY WITH PROPOFOL;  Surgeon: Wyline Mood, MD;  Location: [facility] ENDOSCOPY;  Service: Gastroenterology;  Laterality: N/A;   ESOPHAGOGASTRODUODENOSCOPY ENDOSCOPY  March 2016   EYE SURGERY Bilateral    cataracts   KNEE SURGERY Right    SHOULDER SURGERY Left  TONSILLECTOMY      Home Medications:  Allergies as of 04/06/2023       Reactions   Aspirin Hives   Celery Oil Swelling   Flomax [tamsulosin Hcl] Hives   Hydromorphone Hives   Other Hives   Trout   Oysters [shellfish Allergy]    Amlodipine Besylate Swelling   Ankle edema   Latex Rash        Medication List        Accurate as of April 06, 2023  3:43 PM. If you have any questions, ask your nurse or doctor.          acetaminophen 500 MG tablet Commonly known as: TYLENOL Take 500 mg by mouth every 6 (six) hours as needed.   augmented betamethasone dipropionate 0.05 % cream Commonly known as: Diprolene AF Apply topically 2 (two) times daily.   cetirizine 10 MG chewable tablet Commonly known as: ZYRTEC Chew 10 mg by mouth daily. Take once a day   citalopram 10 MG tablet Commonly known as: CELEXA Take  1 tablet (10 mg total) by mouth daily.   cyanocobalamin 1000 MCG tablet Commonly known as: VITAMIN B12 Take 1,000 mcg by mouth daily.   EYE VITAMINS PO Take by mouth.   Gemtesa 75 MG Tabs Generic drug: Vibegron Take 1 tablet (75 mg total) by mouth daily.   hydrocortisone 2.5 % lotion Apply topically.   ketoconazole 2 % shampoo Commonly known as: NIZORAL Apply topically.   levothyroxine 125 MCG tablet Commonly known as: SYNTHROID Take 1 tablet (125 mcg total) by mouth daily.   losartan 100 MG tablet Commonly known as: COZAAR Take 1 tablet (100 mg total) by mouth daily.        Allergies:  Allergies  Allergen Reactions   Aspirin Hives   Celery Oil Swelling   Flomax [Tamsulosin Hcl] Hives   Hydromorphone Hives   Other Hives    Trout   Oysters [Shellfish Allergy]    Amlodipine Besylate Swelling    Ankle edema   Latex Rash    Family History: Family History  Problem Relation Age of Onset   Breast cancer Mother    COPD Father    Heart disease Father    Prostate cancer Father     Social History:  reports that he has never smoked. He has never used smokeless tobacco. He reports current alcohol use. He reports that he does not use drugs.  ROS: Pertinent ROS in HPI  Physical Exam: BP (!) 135/104   Pulse 75   Constitutional:  Well nourished. Alert and oriented, No acute distress. HEENT: [place] AT, moist mucus membranes.  Trachea midline, no masses. Cardiovascular: No clubbing, cyanosis, or edema. Respiratory: Normal respiratory effort, no increased work of breathing. Neurologic: Grossly intact, no focal deficits, moving all 4 extremities. Psychiatric: Normal mood and affect.  Laboratory Data: Component     Latest Ref Rng 10/22/2022  Glucose     70 - 99 mg/dL 161 (H)   BUN     8 - 27 mg/dL 15   Creatinine     0.96 - 1.27 mg/dL 0.45   BUN/Creatinine Ratio     10 - 24  16   Sodium     134 - 144 mmol/L 143   Potassium     3.5 - 5.2 mmol/L 3.9    Chloride     96 - 106 mmol/L 105   CO2     20 - 29 mmol/L 25   Calcium  8.6 - 10.2 mg/dL 9.2   Total Protein     6.0 - 8.5 g/dL 6.0   Albumin     3.8 - 4.8 g/dL 4.0   Globulin, Total     1.5 - 4.5 g/dL 2.0   Total Bilirubin     0.0 - 1.2 mg/dL 0.3   Alkaline Phosphatase     44 - 121 IU/L 74   AST     0 - 40 IU/L 17   ALT     0 - 44 IU/L 13   eGFR     >59 mL/min/1.73 86     Legend: (H) High  Component     Latest Ref Rng 10/22/2022  Hemoglobin A1C     4.8 - 5.6 % 6.0 (H)   Est. average glucose Bld gHb Est-mCnc     mg/dL 528     Legend: (H) High   Component     Latest Ref Rng 03/30/2023  Specific Gravity, UA     1.005 - 1.030  1.010   pH, UA     5.0 - 7.5  6.0   Color, UA     Yellow  Yellow   Appearance Ur     Clear  Clear   Leukocytes,UA     Negative  Negative   Protein,UA     Negative/Trace  Negative   Glucose, UA     Negative  Negative   Ketones, UA     Negative  Negative   RBC, UA     Negative  Negative   Bilirubin, UA     Negative  Negative   Urobilinogen, Ur     0.2 - 1.0 mg/dL 0.2   Nitrite, UA     Negative  Negative   Microscopic Examination Comment   I have reviewed the labs.   Pertinent Imaging:  04/06/23 14:56  Scan Result 17 ml    Assessment & Plan:    1. BPH with LUTS -most bothersome symptoms are urinary frequency  -continue conservative management, avoiding bladder irritants and timed voiding's  2. Urethral stricture disease -PVR demonstrates adequate emptying  3. OAB --We discussed that overactive bladder (OAB) is not a disease, but is a symptom complex that is generally not life-threatening -Symptoms typically include urinary urgency, frequency, and urge incontinence.   -There are numerous treatment options: behavioral therapies (including bladder training, pelvic floor muscle training, and fluid management), oral antimuscarinics and beta-3 agonist (Mybetriq) and minimally invasive options include intra-detrusor  botox, peripheral tibial nerve stimulation (PTNS), and interstim (SNS).  -These are the anticholinergic medications available for OAB:     *Darifenacin (Enablex)  *Fesoterodine (Toviaz)  Oxybutynin (Ditropan, Ditropan XL, Gelnique, Oxytrol)  Solifenacin (Vesicare)  Tolterodine (Detrol, Detrol LA)  *Trospium (Sanctura, Sanctura XR)  -The anticholinergic medications are available in generics, but they have the side effects of dry mouth, dry eyes, constipation, risk of developing dementia/Alzheimer's disease or the worsening of any dementia/Alzheimer's disease (the * have less risk anecdotally) -These are the beta-3 adrenergic agonists:  Mirabegron (Myrbetriq)  Vibegron (Gemtesa)  -The beta-3 adrenergic agonists are brand name only and have limited insurance coverage and are expensive as an out of pocket options -Explained that PTNS may result in a ~ 50 % improvement in OAB symptoms and will require 12 weekly treatments followed by monthly maintenance treatments for up to two years  -Explained that Botox is injected into the bladder either in the OR or in the office and has a risk of ~  6 % of retention and that the patient will have to CIC or have an indwelling Foley until Botox "wears off" -the interstim is a "pacemaker" for the bladder and will need to be referred to an implanter  -He is wife has severe Parkinson's and he cannot leave her for long periods of time, so PTNS would be difficult -He is not interested in an anticholinergic medication as he fears he is already having issues with forgetfulness -I did give him samples of Gemtesa 75 mg daily -he will return in 1 month for reassessment of his symptoms, epic did not indicate whether or not his insurance would cover the medication, but I explained that it likely will not   Return in about 1 month (around 05/07/2023) for IPSS and PVR.  These notes generated with voice recognition software. I apologize for typographical errors.  Cloretta Ned  [facility] [address]  [address] [place], [place] [address] [phone]

## 2023-04-06 ENCOUNTER — Encounter: Payer: Self-pay | Admitting: Urology

## 2023-04-06 ENCOUNTER — Ambulatory Visit (INDEPENDENT_AMBULATORY_CARE_PROVIDER_SITE_OTHER): Payer: Medicare Other | Admitting: Urology

## 2023-04-06 VITALS — BP 135/104 | HR 75

## 2023-04-06 DIAGNOSIS — N3281 Overactive bladder: Secondary | ICD-10-CM | POA: Diagnosis not present

## 2023-04-06 DIAGNOSIS — N401 Enlarged prostate with lower urinary tract symptoms: Secondary | ICD-10-CM | POA: Diagnosis not present

## 2023-04-06 DIAGNOSIS — N138 Other obstructive and reflux uropathy: Secondary | ICD-10-CM | POA: Diagnosis not present

## 2023-04-06 DIAGNOSIS — N35819 Other urethral stricture, male, unspecified site: Secondary | ICD-10-CM

## 2023-04-06 LAB — BLADDER SCAN AMB NON-IMAGING: Scan Result: 17

## 2023-04-06 MED ORDER — GEMTESA 75 MG PO TABS
75.0000 mg | ORAL_TABLET | Freq: Every day | ORAL | Status: DC
Start: 2023-04-06 — End: 2023-05-03

## 2023-04-08 DIAGNOSIS — M5136 Other intervertebral disc degeneration, lumbar region with discogenic back pain only: Secondary | ICD-10-CM | POA: Diagnosis not present

## 2023-04-08 DIAGNOSIS — M5432 Sciatica, left side: Secondary | ICD-10-CM | POA: Diagnosis not present

## 2023-04-08 DIAGNOSIS — M9905 Segmental and somatic dysfunction of pelvic region: Secondary | ICD-10-CM | POA: Diagnosis not present

## 2023-04-08 DIAGNOSIS — M9903 Segmental and somatic dysfunction of lumbar region: Secondary | ICD-10-CM | POA: Diagnosis not present

## 2023-04-13 DIAGNOSIS — M5136 Other intervertebral disc degeneration, lumbar region with discogenic back pain only: Secondary | ICD-10-CM | POA: Diagnosis not present

## 2023-04-13 DIAGNOSIS — M5432 Sciatica, left side: Secondary | ICD-10-CM | POA: Diagnosis not present

## 2023-04-13 DIAGNOSIS — M9903 Segmental and somatic dysfunction of lumbar region: Secondary | ICD-10-CM | POA: Diagnosis not present

## 2023-04-13 DIAGNOSIS — M9905 Segmental and somatic dysfunction of pelvic region: Secondary | ICD-10-CM | POA: Diagnosis not present

## 2023-04-20 DIAGNOSIS — M5432 Sciatica, left side: Secondary | ICD-10-CM | POA: Diagnosis not present

## 2023-04-20 DIAGNOSIS — M5136 Other intervertebral disc degeneration, lumbar region with discogenic back pain only: Secondary | ICD-10-CM | POA: Diagnosis not present

## 2023-04-20 DIAGNOSIS — M9905 Segmental and somatic dysfunction of pelvic region: Secondary | ICD-10-CM | POA: Diagnosis not present

## 2023-04-20 DIAGNOSIS — M9903 Segmental and somatic dysfunction of lumbar region: Secondary | ICD-10-CM | POA: Diagnosis not present

## 2023-04-20 NOTE — Progress Notes (Unsigned)
 There were no vitals taken for this visit.   Subjective:    Patient ID: Joshua [name], male    DOB: 04-20-1950, 73 y.o.   MRN: [id number]  HPI: ADOLPHUS [name] is a 73 y.o. male  No chief complaint on file.  HYPERTENSION / HYPERLIPIDEMIA Taking Losartan 100 MG.  Satisfied with current treatment? yes Duration of hypertension: years BP monitoring frequency: not checking BP range: None BP medication side effects: no Past BP meds: losartan (cozaar) Duration of hyperlipidemia: years Cholesterol medication side effects: no Cholesterol supplements: none Past cholesterol medications: none Medication compliance: excellent compliance Aspirin: no Recent stressors: no Recurrent headaches: no Visual changes: no Palpitations: no Dyspnea: no Chest pain: no Lower extremity edema: yes Dizzy/lightheaded: no  HYPOTHYROIDISM Taking synthroid 125 MCG daily  Thyroid control status:controlled Satisfied with current treatment? yes Medication side effects: no Medication compliance: excellent compliance Etiology of hypothyroidism:  Recent dose adjustment:no Fatigue: No Cold intolerance: no Heat intolerance: no Weight gain: no Weight loss: no Constipation: no Diarrhea/loose stools: no Palpitations: no Lower extremity edema: yes Anxiety/depressed mood: no  ANEMIA Taking daily B12 vitamin.  Anemia status: controlled Etiology of anemia: pernicious anemia Duration of anemia treatment: years Compliance with treatment: excellent compliance Iron supplementation side effects: no Severity of anemia: mild Fatigue: no Decreased exercise tolerance: no  Dyspnea on exertion: no Palpitations: no Bleeding: no Pica: no  Relevant past medical, surgical, family and social history reviewed and updated as indicated. Interim medical history since our last visit reviewed. Allergies and medications reviewed and updated.  Review of Systems  Constitutional:  Negative for fatigue and  unexpected weight change.  Eyes:  Negative for visual disturbance.  Respiratory:  Negative for chest tightness and shortness of breath.   Cardiovascular:  Positive for leg swelling. Negative for chest pain and palpitations.  Gastrointestinal:  Negative for constipation and diarrhea.  Endocrine: Negative for cold intolerance and heat intolerance.  Neurological:  Negative for dizziness, light-headedness and headaches.  Psychiatric/Behavioral:  Negative for dysphoric mood. The patient is not nervous/anxious.     Per HPI unless specifically indicated above     Objective:    There were no vitals taken for this visit.  Wt Readings from Last 3 Encounters:  03/30/23 210 lb (95.3 kg)  01/04/23 210 lb (95.3 kg)  10/22/22 223 lb (101.2 kg)    Physical Exam Vitals and nursing note reviewed.  Constitutional:      General: He is not in acute distress.    Appearance: Normal appearance. He is obese. He is not ill-appearing, toxic-appearing or diaphoretic.  HENT:     Head: Normocephalic.     Right Ear: External ear normal.     Left Ear: External ear normal.     Nose: Nose normal. No congestion or rhinorrhea.     Mouth/Throat:     Mouth: Mucous membranes are moist.  Eyes:     General:        Right eye: No discharge.        Left eye: No discharge.     Extraocular Movements: Extraocular movements intact.     Conjunctiva/sclera: Conjunctivae normal.     Pupils: Pupils are equal, round, and reactive to light.  Cardiovascular:     Rate and Rhythm: Normal rate and regular rhythm.     Pulses:          Radial pulses are 2+ on the right side and 2+ on the left side.       Dorsalis  pedis pulses are 2+ on the right side and 2+ on the left side.     Heart sounds: No murmur heard. Pulmonary:     Effort: Pulmonary effort is normal. No respiratory distress.     Breath sounds: Normal breath sounds. No wheezing, rhonchi or rales.  Abdominal:     General: Abdomen is flat. Bowel sounds are normal.   Musculoskeletal:     Cervical back: Normal range of motion and neck supple.     Right lower leg: 1+ Edema present.     Left lower leg: 1+ Edema present.  Skin:    General: Skin is warm and dry.     Capillary Refill: Capillary refill takes less than 2 seconds.  Neurological:     General: No focal deficit present.     Mental Status: He is alert and oriented to person, place, and time.  Psychiatric:        Mood and Affect: Mood normal.        Behavior: Behavior normal.        Thought Content: Thought content normal.        Judgment: Judgment normal.    Results for orders placed or performed in visit on 04/06/23  BLADDER SCAN AMB NON-IMAGING   Collection Time: 04/06/23  2:56 PM  Result Value Ref Range   Scan Result 17 ml       Assessment & Plan:   Problem List Items Addressed This Visit       Cardiovascular and Mediastinum   Benign essential HTN - Primary   Hypertension     Endocrine   Hypothyroidism     Genitourinary   BPH with obstruction/lower urinary tract symptoms     Other   B12 deficiency      Follow up plan: No follow-ups on file.

## 2023-04-21 ENCOUNTER — Ambulatory Visit (INDEPENDENT_AMBULATORY_CARE_PROVIDER_SITE_OTHER): Payer: Medicare Other | Admitting: Nurse Practitioner

## 2023-04-21 ENCOUNTER — Encounter: Payer: Self-pay | Admitting: Nurse Practitioner

## 2023-04-21 VITALS — BP 115/68 | HR 72 | Temp 98.2°F | Ht 67.0 in | Wt 213.0 lb

## 2023-04-21 DIAGNOSIS — N138 Other obstructive and reflux uropathy: Secondary | ICD-10-CM

## 2023-04-21 DIAGNOSIS — E538 Deficiency of other specified B group vitamins: Secondary | ICD-10-CM

## 2023-04-21 DIAGNOSIS — Z636 Dependent relative needing care at home: Secondary | ICD-10-CM | POA: Diagnosis not present

## 2023-04-21 DIAGNOSIS — I10 Essential (primary) hypertension: Secondary | ICD-10-CM

## 2023-04-21 DIAGNOSIS — E039 Hypothyroidism, unspecified: Secondary | ICD-10-CM

## 2023-04-21 DIAGNOSIS — N401 Enlarged prostate with lower urinary tract symptoms: Secondary | ICD-10-CM | POA: Diagnosis not present

## 2023-04-21 DIAGNOSIS — R7303 Prediabetes: Secondary | ICD-10-CM | POA: Diagnosis not present

## 2023-04-21 MED ORDER — CITALOPRAM HYDROBROMIDE 20 MG PO TABS: 20.0000 mg | ORAL_TABLET | Freq: Every day | ORAL | 0 refills | Status: DC

## 2023-04-21 NOTE — Assessment & Plan Note (Signed)
 Labs ordered at visit today.  Will make recommendations based on lab results.

## 2023-04-21 NOTE — Assessment & Plan Note (Signed)
 Ongoing symptoms.  Will increase Celexa to 20mg .  Follow up in 3 months.  Call sooner if concerns arise.  They did bring in Palliative Care to help patient and his wife.

## 2023-04-21 NOTE — Assessment & Plan Note (Signed)
Chronic.  Controlled.  Continue with current medication regimen of Levothyroxine.  Refills sent today.  Labs ordered today.  Return to clinic in 6 months for reevaluation.  Call sooner if concerns arise.   

## 2023-04-21 NOTE — Assessment & Plan Note (Signed)
Controlled.  Compliant with med regimen, BP at goal today, encouraged to start checking BP at home and bringing in recording log. Continue with losartan 100 MG. Refills sent today.  Labs ordered. Follow up in 6 months.  Call sooner if concerns arise.

## 2023-04-21 NOTE — Assessment & Plan Note (Signed)
 Followed by Urology.  Now on Gemtesa.  Follows up with Urology on 05/11/23.

## 2023-04-22 LAB — COMPREHENSIVE METABOLIC PANEL
ALT: 11 IU/L (ref 0–44)
AST: 16 IU/L (ref 0–40)
Albumin: 4.2 g/dL (ref 3.8–4.8)
Alkaline Phosphatase: 74 IU/L (ref 44–121)
BUN/Creatinine Ratio: 23 (ref 10–24)
BUN: 21 mg/dL (ref 8–27)
Bilirubin Total: 0.6 mg/dL (ref 0.0–1.2)
CO2: 24 mmol/L (ref 20–29)
Calcium: 9.6 mg/dL (ref 8.6–10.2)
Chloride: 103 mmol/L (ref 96–106)
Creatinine, Ser: 0.9 mg/dL (ref 0.76–1.27)
Globulin, Total: 2.3 g/dL (ref 1.5–4.5)
Glucose: 98 mg/dL (ref 70–99)
Potassium: 3.9 mmol/L (ref 3.5–5.2)
Sodium: 140 mmol/L (ref 134–144)
Total Protein: 6.5 g/dL (ref 6.0–8.5)
eGFR: 90 mL/min/{1.73_m2} (ref >59)

## 2023-04-22 LAB — TSH: TSH: 0.666 u[IU]/mL (ref 0.450–4.500)

## 2023-04-22 LAB — LIPID PANEL
Chol/HDL Ratio: 3.6 ratio (ref 0.0–5.0)
Cholesterol, Total: 168 mg/dL (ref 100–199)
HDL: 47 mg/dL (ref >39)
LDL Chol Calc (NIH): 102 mg/dL — ABNORMAL HIGH (ref 0–99)
Triglycerides: 107 mg/dL (ref 0–149)
VLDL Cholesterol Cal: 19 mg/dL (ref 5–40)

## 2023-04-22 LAB — HEMOGLOBIN A1C
Est. average glucose Bld gHb Est-mCnc: 120 mg/dL
Hgb A1c MFr Bld: 5.8 % — ABNORMAL HIGH (ref 4.8–5.6)

## 2023-04-22 LAB — VITAMIN B12: Vitamin B-12: 934 pg/mL (ref 232–1245)

## 2023-04-22 LAB — T4, FREE: Free T4: 1.41 ng/dL (ref 0.82–1.77)

## 2023-04-27 DIAGNOSIS — M9903 Segmental and somatic dysfunction of lumbar region: Secondary | ICD-10-CM | POA: Diagnosis not present

## 2023-04-27 DIAGNOSIS — M5432 Sciatica, left side: Secondary | ICD-10-CM | POA: Diagnosis not present

## 2023-04-27 DIAGNOSIS — M9905 Segmental and somatic dysfunction of pelvic region: Secondary | ICD-10-CM | POA: Diagnosis not present

## 2023-04-27 DIAGNOSIS — M5136 Other intervertebral disc degeneration, lumbar region with discogenic back pain only: Secondary | ICD-10-CM | POA: Diagnosis not present

## 2023-05-03 ENCOUNTER — Encounter: Payer: Self-pay | Admitting: Nurse Practitioner

## 2023-05-03 ENCOUNTER — Other Ambulatory Visit: Payer: Self-pay | Admitting: Nurse Practitioner

## 2023-05-03 ENCOUNTER — Ambulatory Visit (INDEPENDENT_AMBULATORY_CARE_PROVIDER_SITE_OTHER): Payer: Medicare Other | Admitting: Nurse Practitioner

## 2023-05-03 VITALS — BP 123/72 | HR 77 | Temp 98.4°F | Resp 15 | Ht 67.01 in | Wt 213.2 lb

## 2023-05-03 DIAGNOSIS — Z636 Dependent relative needing care at home: Secondary | ICD-10-CM | POA: Diagnosis not present

## 2023-05-03 DIAGNOSIS — N3281 Overactive bladder: Secondary | ICD-10-CM | POA: Diagnosis not present

## 2023-05-03 MED ORDER — GEMTESA 75 MG PO TABS
75.0000 mg | ORAL_TABLET | Freq: Every day | ORAL | 0 refills | Status: DC
Start: 2023-05-03 — End: 2023-06-07

## 2023-05-03 MED ORDER — FLUOXETINE HCL 20 MG PO CAPS: 20.0000 mg | ORAL_CAPSULE | Freq: Every day | ORAL | 0 refills | Status: DC

## 2023-05-03 NOTE — Progress Notes (Signed)
 BP 123/72 (BP Location: Right Arm, Patient Position: Sitting, Cuff Size: Large)   Pulse 77   Temp 98.4 F (36.9 C) (Oral)   Resp 15   Ht 5' 7.01" (1.702 m)   Wt 213 lb 3.2 oz (96.7 kg)   SpO2 98%   BMI 33.38 kg/m    Subjective:    Patient ID: Joshua [name], male    DOB: 1950-05-24, 73 y.o.   MRN: [id number]  HPI: Joshua [name] is a 73 y.o. male  Chief Complaint  Patient presents with   Depression   Anxiety    Feels like he is more nervous on the Vibegron (no med left) and his hands are shaking. Then also says it could be the citalopram.     Patient states he is feeling more anxious.  This has increased with the citalopram.  This nervousness has started when he started the citalopram.  Denies SI.  He is still very sad about his wife's current condition.    Relevant past medical, surgical, family and social history reviewed and updated as indicated. Interim medical history since our last visit reviewed. Allergies and medications reviewed and updated.  Review of Systems  Psychiatric/Behavioral:  The patient is nervous/anxious.     Per HPI unless specifically indicated above     Objective:    BP 123/72 (BP Location: Right Arm, Patient Position: Sitting, Cuff Size: Large)   Pulse 77   Temp 98.4 F (36.9 C) (Oral)   Resp 15   Ht 5' 7.01" (1.702 m)   Wt 213 lb 3.2 oz (96.7 kg)   SpO2 98%   BMI 33.38 kg/m   Wt Readings from Last 3 Encounters:  05/03/23 213 lb 3.2 oz (96.7 kg)  04/21/23 213 lb (96.6 kg)  03/30/23 210 lb (95.3 kg)    Physical Exam Vitals and nursing note reviewed.  Constitutional:      General: He is not in acute distress.    Appearance: Normal appearance. He is obese. He is not ill-appearing, toxic-appearing or diaphoretic.  HENT:     Head: Normocephalic.     Right Ear: External ear normal.     Left Ear: External ear normal.     Nose: Nose normal. No congestion or rhinorrhea.     Mouth/Throat:     Mouth: Mucous membranes are moist.   Eyes:     General:        Right eye: No discharge.        Left eye: No discharge.     Extraocular Movements: Extraocular movements intact.     Conjunctiva/sclera: Conjunctivae normal.     Pupils: Pupils are equal, round, and reactive to light.  Cardiovascular:     Rate and Rhythm: Normal rate and regular rhythm.     Pulses:          Radial pulses are 2+ on the right side and 2+ on the left side.       Dorsalis pedis pulses are 2+ on the right side and 2+ on the left side.     Heart sounds: No murmur heard. Pulmonary:     Effort: Pulmonary effort is normal. No respiratory distress.     [name] sounds: Normal [name] sounds. No wheezing, rhonchi or rales.  Abdominal:     General: Abdomen is flat. Bowel sounds are normal.  Musculoskeletal:     Cervical back: Normal range of motion and neck supple.  Skin:    General: Skin is warm and dry.  Capillary Refill: Capillary refill takes less than 2 seconds.  Neurological:     General: No focal deficit present.     Mental Status: He is alert and oriented to person, place, and time.  Psychiatric:        Mood and Affect: Mood normal. Affect is tearful.        Behavior: Behavior normal.        Thought Content: Thought content normal.        Judgment: Judgment normal.     Results for orders placed or performed in visit on 04/21/23  Comprehensive metabolic panel   Collection Time: 04/21/23 11:34 AM  Result Value Ref Range   Glucose 98 70 - 99 mg/dL   BUN 21 8 - 27 mg/dL   Creatinine, Ser 4.78 0.76 - 1.27 mg/dL   eGFR 90 >29 FA/OZH/0.86   BUN/Creatinine Ratio 23 10 - 24   Sodium 140 134 - 144 mmol/L   Potassium 3.9 3.5 - 5.2 mmol/L   Chloride 103 96 - 106 mmol/L   CO2 24 20 - 29 mmol/L   Calcium 9.6 8.6 - 10.2 mg/dL   Total Protein 6.5 6.0 - 8.5 g/dL   Albumin 4.2 3.8 - 4.8 g/dL   Globulin, Total 2.3 1.5 - 4.5 g/dL   Bilirubin Total 0.6 0.0 - 1.2 mg/dL   Alkaline Phosphatase 74 44 - 121 IU/L   AST 16 0 - 40 IU/L   ALT 11 0 - 44  IU/L  Hemoglobin A1c   Collection Time: 04/21/23 11:34 AM  Result Value Ref Range   Hgb A1c MFr Bld 5.8 (H) 4.8 - 5.6 %   Est. average glucose Bld gHb Est-mCnc 120 mg/dL  Lipid panel   Collection Time: 04/21/23 11:34 AM  Result Value Ref Range   Cholesterol, Total 168 100 - 199 mg/dL   Triglycerides 578 0 - 149 mg/dL   HDL 47 >46 mg/dL   VLDL Cholesterol Cal 19 5 - 40 mg/dL   LDL Chol Calc (NIH) 962 (H) 0 - 99 mg/dL   Chol/HDL Ratio 3.6 0.0 - 5.0 ratio  TSH   Collection Time: 04/21/23 11:34 AM  Result Value Ref Range   TSH 0.666 0.450 - 4.500 uIU/mL  T4, free   Collection Time: 04/21/23 11:34 AM  Result Value Ref Range   Free T4 1.41 0.82 - 1.77 ng/dL  X52   Collection Time: 04/21/23 11:34 AM  Result Value Ref Range   Vitamin B-12 934 232 - 1,245 pg/mL      Assessment & Plan:   Problem List Items Addressed This Visit       Other   Caregiver stress   Chronic. Not tolerating Celexa well.  Will change to Fluoxetine.  Side effects and benefits of medication discussed.  Follow up in 1 month.  Call sooner if concerns arise.       Other Visit Diagnoses       OAB (overactive bladder)    -  Primary   Refilled medication for 30 days until patient is able to see Urology next week.   Relevant Medications   Vibegron (GEMTESA) 75 MG TABS         Follow up plan: Return in about 1 month (around 06/02/2023) for Depression/Anxiety FU.

## 2023-05-03 NOTE — Assessment & Plan Note (Signed)
 Chronic. Not tolerating Celexa well.  Will change to Fluoxetine.  Side effects and benefits of medication discussed.  Follow up in 1 month.  Call sooner if concerns arise.

## 2023-05-04 DIAGNOSIS — M5136 Other intervertebral disc degeneration, lumbar region with discogenic back pain only: Secondary | ICD-10-CM | POA: Diagnosis not present

## 2023-05-04 DIAGNOSIS — M5432 Sciatica, left side: Secondary | ICD-10-CM | POA: Diagnosis not present

## 2023-05-04 DIAGNOSIS — M9905 Segmental and somatic dysfunction of pelvic region: Secondary | ICD-10-CM | POA: Diagnosis not present

## 2023-05-04 DIAGNOSIS — M9903 Segmental and somatic dysfunction of lumbar region: Secondary | ICD-10-CM | POA: Diagnosis not present

## 2023-05-05 ENCOUNTER — Other Ambulatory Visit: Payer: Self-pay | Admitting: Nurse Practitioner

## 2023-05-05 DIAGNOSIS — N3281 Overactive bladder: Secondary | ICD-10-CM

## 2023-05-05 NOTE — Telephone Encounter (Signed)
 Requested medications are due for refill today.  See note  Requested medications are on the active medications list.  no  Last refill. Ordered 05/03/2023  Future visit scheduled.   yes  Notes to clinic.  Pharmacy comment: Alternative Requested:THE PRESCRIBED MEDICATION IS NOT COVERED BY INSURANCE. PLEASE CONSIDER CHANGING TO ONE OF THE SUGGESTED COVERED ALTERNATIVES.    Requested Prescriptions  Pending Prescriptions Disp Refills   solifenacin (VESICARE) 5 MG tablet [Pharmacy Med Name: SOLIFENACIN 5 MG TABLET]  0     Urology:  Bladder Agents 2 Passed - 05/05/2023 10:11 AM      Passed - Cr in normal range and within 360 days    Creatinine, Ser  Date Value Ref Range Status  04/21/2023 0.90 0.76 - 1.27 mg/dL Final         Passed - ALT in normal range and within 360 days    ALT  Date Value Ref Range Status  04/21/2023 11 0 - 44 IU/L Final         Passed - AST in normal range and within 360 days    AST  Date Value Ref Range Status  04/21/2023 16 0 - 40 IU/L Final         Passed - Valid encounter within last 12 months    Recent Outpatient Visits           2 days ago OAB (overactive bladder)   [place] [facility] Larae Grooms, NP   2 weeks ago Benign essential HTN   [place] [facility] Larae Grooms, NP   1 month ago Dysuria   [place] [facility] Larae Grooms, NP       Future Appointments             In 6 days McGowan, Elana Alm [facility] [place]

## 2023-05-06 ENCOUNTER — Telehealth: Payer: Self-pay

## 2023-05-06 NOTE — Telephone Encounter (Signed)
 Copied from CRM [id number]. Topic: Clinical - Prescription Issue >> May 06, 2023 10:06 AM Everette C wrote: Reason for CRM: The patient has been directed by their pharmacy [facility] to contact their PCP and request prior authorization for their Vibegron (GEMTESA) 75 MG TABS [[id number]]   Please contact the patient further if needed

## 2023-05-06 NOTE — Telephone Encounter (Signed)
 Prior Joshua [name] will be started and patient to be informed when process is completed.

## 2023-05-06 NOTE — Telephone Encounter (Signed)
 Requested medications are due for refill today.  See note from pharmacy  Requested medications are on the active medications list.  no  Last refill. Ordered 05/03/2023  Future visit scheduled.   no  Notes to clinic.  Pharmacy comment: Alternative Requested:THE PRESCRIBED MEDICATION IS NOT COVERED BY INSURANCE. PLEASE CONSIDER CHANGING TO ONE OF THE SUGGESTED COVERED ALTERNATIVES.    Requested Prescriptions  Pending Prescriptions Disp Refills   oxybutynin (DITROPAN XL) 15 MG 24 hr tablet [Pharmacy Med Name: OXYBUTYNIN CL ER 15 MG TABLET]  0     Urology:  Bladder Agents Passed - 05/06/2023 11:53 AM      Passed - Valid encounter within last 12 months    Recent Outpatient Visits           3 days ago OAB (overactive bladder)   [place] [facility] Larae Grooms, NP   2 weeks ago Benign essential HTN   [place] [facility] Larae Grooms, NP   1 month ago Dysuria   [place] [facility] Larae Grooms, NP       Future Appointments             In 5 days McGowan, Elana Alm [facility]

## 2023-05-10 DIAGNOSIS — M5136 Other intervertebral disc degeneration, lumbar region with discogenic back pain only: Secondary | ICD-10-CM | POA: Diagnosis not present

## 2023-05-10 DIAGNOSIS — M5432 Sciatica, left side: Secondary | ICD-10-CM | POA: Diagnosis not present

## 2023-05-10 DIAGNOSIS — M9905 Segmental and somatic dysfunction of pelvic region: Secondary | ICD-10-CM | POA: Diagnosis not present

## 2023-05-10 DIAGNOSIS — M9903 Segmental and somatic dysfunction of lumbar region: Secondary | ICD-10-CM | POA: Diagnosis not present

## 2023-05-10 NOTE — Telephone Encounter (Signed)
 Joshua [name] was this PA started?

## 2023-05-10 NOTE — Telephone Encounter (Signed)
 Requested has been submitted to patient [facility] Medicare Part D plan, however if not approved or other needs are required patient will be directed to follow up with Urology directly. Provider will not supply any medications/prior auth beyond the one time courtesy. If medication is denied patient should again follow up with urology.

## 2023-05-10 NOTE — Progress Notes (Deleted)
 05/11/2023 1:27 PM   Joshua [name] [birth year] [id number]  Referring provider: Larae Grooms, NP [address] [place],  [place] [address]  Urological history: 1. Urethral stricture -per patient several years ago -cysto 2017 - NED   2. BPH with LU TS -PSA 2.5 in 10/2019   3. ED -contributing factors of age, hypothyroidism, HLD and HTN  No chief complaint on file.  HPI: Joshua [name] is a 73 y.o. male who presents today for one month follow up.    Previous records reviewed.   At his visit on 04/06/2023.  I PSS 19/5.  PVR 17 mL.  He is having daytime frequency.  He states when he urinates in about 15 to 20 minutes he has to turn around and go right back to it.  This has been going on for several months.  Patient denies any modifying or aggravating factors.  Patient denies any recent UTI's, gross hematuria, dysuria or suprapubic/flank pain.  Patient denies any fevers, chills, nausea or vomiting.  He was given Gemtesa samples and asked to return in one month.    I PSS ***  PVR ***     Score:  1-7 Mild 8-19 Moderate 20-35 Severe     PMH: Past Medical History:  Diagnosis Date   Allergy    Amnesia 06/13/2014   B12 deficiency 06/13/2014   Benign essential HTN 10/30/2014   BPH (benign prostatic hyperplasia)    BPH with obstruction/lower urinary tract symptoms 05/15/2015   History of kidney stones    Hypogonadism in male    Hypothyroidism 10/30/2014    Surgical History: Past Surgical History:  Procedure Laterality Date   COLONOSCOPY  March 2016   COLONOSCOPY WITH PROPOFOL N/A 05/29/2020   Procedure: COLONOSCOPY WITH PROPOFOL;  Surgeon: Wyline Mood, MD;  Location: [facility] ENDOSCOPY;  Service: Gastroenterology;  Laterality: N/A;   COLONOSCOPY WITH PROPOFOL N/A 01/06/2021   Procedure: COLONOSCOPY WITH PROPOFOL;  Surgeon: Wyline Mood, MD;  Location: [facility] ENDOSCOPY;  Service: Gastroenterology;  Laterality: N/A;   COLONOSCOPY WITH PROPOFOL N/A 07/07/2022   Procedure:  COLONOSCOPY WITH PROPOFOL;  Surgeon: Wyline Mood, MD;  Location[facility] ENDOSCOPY;  Service: Gastroenterology;  Laterality: N/A;   ESOPHAGOGASTRODUODENOSCOPY ENDOSCOPY  March 2016   EYE SURGERY Bilateral    cataracts   KNEE SURGERY Right    SHOULDER SURGERY Left    TONSILLECTOMY      Home Medications:  Allergies as of 05/11/2023       Reactions   Aspirin Hives   Celery Oil Swelling   Flomax [tamsulosin Hcl] Hives   Hydromorphone Hives   Other Hives   Trout   Oysters [shellfish Allergy]    Amlodipine Besylate Swelling   Ankle edema   Latex Rash        Medication List        Accurate as of May 10, 2023  1:27 PM. If you have any questions, ask your nurse or doctor.          acetaminophen 500 MG tablet Commonly known as: TYLENOL Take 500 mg by mouth every 6 (six) hours as needed.   cetirizine 10 MG chewable tablet Commonly known as: ZYRTEC Chew 10 mg by mouth daily. Take once a day   cyanocobalamin 1000 MCG tablet Commonly known as: VITAMIN B12 Take 1,000 mcg by mouth daily.   EYE VITAMINS PO Take by mouth.   FLUoxetine 20 MG capsule Commonly known as: PROZAC Take 1 capsule (20 mg total) by mouth daily.  Gemtesa 75 MG Tabs Generic drug: Vibegron Take 1 tablet (75 mg total) by mouth daily.   hydrocortisone 2.5 % lotion Apply topically.   levothyroxine 125 MCG tablet Commonly known as: SYNTHROID Take 1 tablet (125 mcg total) by mouth daily.   losartan 100 MG tablet Commonly known as: COZAAR Take 1 tablet (100 mg total) by mouth daily.        Allergies:  Allergies  Allergen Reactions   Aspirin Hives   Celery Oil Swelling   Flomax [Tamsulosin Hcl] Hives   Hydromorphone Hives   Other Hives    Trout   Oysters [Shellfish Allergy]    Amlodipine Besylate Swelling    Ankle edema   Latex Rash    Family History: Family History  Problem Relation Age of Onset   Breast cancer Mother    COPD Father    Heart disease Father    Prostate cancer  Father     Social History:  reports that he has never smoked. He has never used smokeless tobacco. He reports current alcohol use. He reports that he does not use drugs.  ROS: Pertinent ROS in HPI  Physical Exam: There were no vitals taken for this visit.  Constitutional:  Well nourished. Alert and oriented, No acute distress. HEENT: [place] AT, moist mucus membranes.  Trachea midline, no masses. Cardiovascular: No clubbing, cyanosis, or edema. Respiratory: Normal respiratory effort, no increased work of breathing. GI: Abdomen is soft, non tender, non distended, no abdominal masses. Liver and spleen not palpable.  No hernias appreciated.  Stool sample for occult testing is not indicated.   GU: No CVA tenderness.  No bladder fullness or masses.  Patient with circumcised/uncircumcised phallus. ***Foreskin easily retracted***  Urethral meatus is patent.  No penile discharge. No penile lesions or rashes. Scrotum without lesions, cysts, rashes and/or edema.  Testicles are located scrotally bilaterally. No masses are appreciated in the testicles. Left and right epididymis are normal. Rectal: Patient with  normal sphincter tone. Anus and perineum without scarring or rashes. No rectal masses are appreciated. Prostate is approximately *** grams, *** nodules are appreciated. Seminal vesicles are normal. Skin: No rashes, bruises or suspicious lesions. Lymph: No cervical or inguinal adenopathy. Neurologic: Grossly intact, no focal deficits, moving all 4 extremities. Psychiatric: Normal mood and affect.   Laboratory Data: Component     Latest Ref Rng 04/21/2023  TSH     0.450 - 4.500 uIU/mL 0.666    Component     Latest Ref Rng 04/21/2023  Cholesterol, Total     100 - 199 mg/dL 161   Triglycerides     0 - 149 mg/dL 096   HDL Cholesterol     >39 mg/dL 47   VLDL Cholesterol Cal     5 - 40 mg/dL 19   LDL Chol Calc (NIH)     0 - 99 mg/dL 045 (H)   Total CHOL/HDL Ratio     0.0 - 5.0 ratio 3.6      Legend: (H) High  Component     Latest Ref Rng 04/21/2023  Hemoglobin A1C     4.8 - 5.6 % 5.8 (H)   Est. average glucose Bld gHb Est-mCnc     mg/dL 409     Legend: (H) High  Component     Latest Ref Rng 04/21/2023  Glucose     70 - 99 mg/dL 98   BUN     8 - 27 mg/dL 21   Creatinine  0.76 - 1.27 mg/dL 1.30   BUN/Creatinine Ratio     10 - 24  23   Sodium     134 - 144 mmol/L 140   Potassium     3.5 - 5.2 mmol/L 3.9   Chloride     96 - 106 mmol/L 103   CO2     20 - 29 mmol/L 24   Calcium     8.6 - 10.2 mg/dL 9.6   Total Protein     6.0 - 8.5 g/dL 6.5   Albumin     3.8 - 4.8 g/dL 4.2   Globulin, Total     1.5 - 4.5 g/dL 2.3   Total Bilirubin     0.0 - 1.2 mg/dL 0.6   Alkaline Phosphatase     44 - 121 IU/L 74   AST     0 - 40 IU/L 16   ALT     0 - 44 IU/L 11   eGFR     >59 mL/min/1.73 90     Component     Latest Ref Rng 03/30/2023  Specific Gravity, UA     1.005 - 1.030  1.010   pH, UA     5.0 - 7.5  6.0   Color, UA     Yellow  Yellow   Appearance Ur     Clear  Clear   Leukocytes,UA     Negative  Negative   Protein,UA     Negative/Trace  Negative   Glucose, UA     Negative  Negative   Ketones, UA     Negative  Negative   RBC, UA     Negative  Negative   Bilirubin, UA     Negative  Negative   Urobilinogen, Ur     0.2 - 1.0 mg/dL 0.2   Nitrite, UA     Negative  Negative   Microscopic Examination Comment   I have reviewed the labs.   Pertinent Imaging: ***   Assessment & Plan:    1. BPH with LUTS -most bothersome symptoms are urinary frequency  -continue conservative management, avoiding bladder irritants and timed voiding's  2. Urethral stricture disease -PVR demonstrates adequate emptying ***  3. OAB  -***   No follow-ups on file.  These notes generated with voice recognition software. I apologize for typographical errors.  Cloretta Ned  [facility] [address]  [address] [place], [place] [address] [phone]

## 2023-05-11 ENCOUNTER — Ambulatory Visit: Admitting: Urology

## 2023-05-11 DIAGNOSIS — N3281 Overactive bladder: Secondary | ICD-10-CM

## 2023-05-11 DIAGNOSIS — N35819 Other urethral stricture, male, unspecified site: Secondary | ICD-10-CM

## 2023-05-11 DIAGNOSIS — N138 Other obstructive and reflux uropathy: Secondary | ICD-10-CM

## 2023-05-16 NOTE — Telephone Encounter (Signed)
 PA denied. Patient should follow up with urology provider.

## 2023-05-16 NOTE — Telephone Encounter (Signed)
 Copied from CRM [id number]. Topic: Clinical - Prescription Issue >> May 06, 2023 10:06 AM Everette C wrote: Reason for CRM: The patient has been directed by their pharmacy [facility] to contact their PCP and request prior authorization for their Vibegron (GEMTESA) 75 MG TABS [[id number]]   Please contact the patient further if needed

## 2023-05-17 DIAGNOSIS — M5136 Other intervertebral disc degeneration, lumbar region with discogenic back pain only: Secondary | ICD-10-CM | POA: Diagnosis not present

## 2023-05-17 DIAGNOSIS — M5432 Sciatica, left side: Secondary | ICD-10-CM | POA: Diagnosis not present

## 2023-05-17 DIAGNOSIS — M9905 Segmental and somatic dysfunction of pelvic region: Secondary | ICD-10-CM | POA: Diagnosis not present

## 2023-05-17 DIAGNOSIS — M9903 Segmental and somatic dysfunction of lumbar region: Secondary | ICD-10-CM | POA: Diagnosis not present

## 2023-05-24 DIAGNOSIS — H353221 Exudative age-related macular degeneration, left eye, with active choroidal neovascularization: Secondary | ICD-10-CM | POA: Diagnosis not present

## 2023-05-25 DIAGNOSIS — M9903 Segmental and somatic dysfunction of lumbar region: Secondary | ICD-10-CM | POA: Diagnosis not present

## 2023-05-25 DIAGNOSIS — M5136 Other intervertebral disc degeneration, lumbar region with discogenic back pain only: Secondary | ICD-10-CM | POA: Diagnosis not present

## 2023-05-25 DIAGNOSIS — M5432 Sciatica, left side: Secondary | ICD-10-CM | POA: Diagnosis not present

## 2023-05-25 DIAGNOSIS — M9905 Segmental and somatic dysfunction of pelvic region: Secondary | ICD-10-CM | POA: Diagnosis not present

## 2023-06-01 DIAGNOSIS — M5432 Sciatica, left side: Secondary | ICD-10-CM | POA: Diagnosis not present

## 2023-06-01 DIAGNOSIS — M9905 Segmental and somatic dysfunction of pelvic region: Secondary | ICD-10-CM | POA: Diagnosis not present

## 2023-06-01 DIAGNOSIS — M9903 Segmental and somatic dysfunction of lumbar region: Secondary | ICD-10-CM | POA: Diagnosis not present

## 2023-06-01 DIAGNOSIS — M5136 Other intervertebral disc degeneration, lumbar region with discogenic back pain only: Secondary | ICD-10-CM | POA: Diagnosis not present

## 2023-06-03 ENCOUNTER — Ambulatory Visit (INDEPENDENT_AMBULATORY_CARE_PROVIDER_SITE_OTHER): Admitting: Urology

## 2023-06-03 VITALS — BP 121/80 | HR 76 | Ht 67.0 in | Wt 223.0 lb

## 2023-06-03 DIAGNOSIS — N3281 Overactive bladder: Secondary | ICD-10-CM | POA: Diagnosis not present

## 2023-06-03 DIAGNOSIS — N138 Other obstructive and reflux uropathy: Secondary | ICD-10-CM | POA: Diagnosis not present

## 2023-06-03 DIAGNOSIS — N401 Enlarged prostate with lower urinary tract symptoms: Secondary | ICD-10-CM | POA: Diagnosis not present

## 2023-06-03 DIAGNOSIS — N35819 Other urethral stricture, male, unspecified site: Secondary | ICD-10-CM | POA: Diagnosis not present

## 2023-06-03 LAB — BLADDER SCAN AMB NON-IMAGING: Scan Result: 24

## 2023-06-03 NOTE — Patient Instructions (Signed)
 Cystoscopy Cystoscopy is a procedure that is used to help diagnose and sometimes treat conditions that affect the lower urinary tract. The lower urinary tract includes the bladder and the urethra. The urethra is the tube that drains urine from the bladder. Cystoscopy is done using a thin, tube-shaped instrument with a light and camera at the end (cystoscope). The cystoscope may be hard or flexible, depending on the goal of the procedure. The cystoscope is inserted through the urethra, into the bladder. Cystoscopy may be recommended if you have: Urinary tract infections that keep coming back. Blood in the urine (hematuria). An inability to control when you urinate (urinary incontinence) or an overactive bladder. Unusual cells found in a urine sample. A blockage in the urethra, such as a urinary stone. Painful urination. An abnormality in the bladder found during an intravenous pyelogram (IVP) or CT scan. What are the risks? Generally, this is a safe procedure. However, problems may occur, including: Infection. Bleeding.  What happens during the procedure?  You will be given one or more of the following: A medicine to numb the area (local anesthetic). The area around the opening of your urethra will be cleaned. The cystoscope will be passed through your urethra into your bladder. Germ-free (sterile) fluid will flow through the cystoscope to fill your bladder. The fluid will stretch your bladder so that your health care provider can clearly examine your bladder walls. Your doctor will look at the urethra and bladder. The cystoscope will be removed The procedure may vary among health care providers  What can I expect after the procedure? After the procedure, it is common to have: Some soreness or pain in your urethra. Urinary symptoms. These include: Mild pain or burning when you urinate. Pain should stop within a few minutes after you urinate. This may last for up to a few days after the  procedure. A small amount of blood in your urine for several days. Feeling like you need to urinate but producing only a small amount of urine. Follow these instructions at home: General instructions Return to your normal activities as told by your health care provider.  Drink plenty of fluids after the procedure. Keep all follow-up visits as told by your health care provider. This is important. Contact a health care provider if you: Have pain that gets worse or does not get better with medicine, especially pain when you urinate lasting longer than 72 hours after the procedure. Have trouble urinating. Get help right away if you: Have blood clots in your urine. Have a fever or chills. Are unable to urinate. Summary Cystoscopy is a procedure that is used to help diagnose and sometimes treat conditions that affect the lower urinary tract. Cystoscopy is done using a thin, tube-shaped instrument with a light and camera at the end. After the procedure, it is common to have some soreness or pain in your urethra. It is normal to have blood in your urine after the procedure.  If you were prescribed an antibiotic medicine, take it as told by your health care provider.  This information is not intended to replace advice given to you by your health care provider. Make sure you discuss any questions you have with your health care provider. Document Revised: 01/10/2018 Document Reviewed: 01/10/2018 Elsevier Patient Education  2020 Else[employer] Inc.   Transrectal Ultrasound  A transrectal ultrasound is a procedure that uses sound waves to create images of the prostate gland and nearby tissues. For this procedure, an ultrasound probe is placed  in the rectum. The probe sends sound waves through the wall of the rectum into the prostate gland. The prostate is a walnut-sized gland that is located below the bladder and in front of the rectum. The images show the size and shape of the prostate gland and nearby  structures. You may need this test if you have: Trouble urinating. Trouble getting your partner pregnant (infertility). An abnormal result from a prostate screening exam. Tell a health care provider about: Any allergies you have. All medicines you are taking, including vitamins, herbs, eye drops, creams, and over-the-counter medicines. Any bleeding problems you have. Any surgeries you have had. Any medical conditions you have. Any prostate infections you have had. What are the risks? Generally, this is a safe procedure. However, problems may occur, including: Discomfort during the procedure. Blood in your urine or sperm after the procedure. This may occur if a sample of tissue is taken to look at under a microscope (biopsy) during the procedure. What happens before the procedure? Your health care provider may instruct you to use an enema 1-4 hours before the procedure. Follow instructions from your health care provider about how to do the enema. Ask your health care provider about: Changing or stopping your regular medicines. This is especially important if you are taking diabetes medicines or blood thinners. Taking medicines such as aspirin and ibuprofen. These medicines can thin your blood. Do not take these medicines unless your health care provider tells you to take them. Taking over-the-counter medicines, vitamins, herbs, and supplements. What happens during the procedure? You will be asked to lie down on your left side on an exam table. You will bend your knees toward your chest. Gel will be put on a small probe that is about the width of a finger. The probe will be gently inserted into your rectum. You may have a feeling of fullness but should not feel pain. The probe will send signals to a computer that will create images. These will be displayed on a monitor that looks like a small television screen. The technician will slightly rotate the probe throughout the procedure. While  rotating the probe, he or she will view and capture images of the prostate gland and the surrounding structures from different angles. Your health care provider may take a biopsy sample of prostate tissue during the procedure. The images captured from the ultrasound will help guide the needle that is used to remove a sample of tissue. The sample will be sent to a lab for testing. The probe will be removed. The procedure may vary among health care providers and hospitals. What can I expect after the procedure? It is up to you to get the results of your procedure. Ask your health care provider, or the department that is doing the procedure, when your results will be ready. Keep all follow-up visits. This is important. Summary A transrectal ultrasound is a procedure that uses sound waves to create images of the prostate gland and nearby tissues. The images show the size and shape of the prostate gland and nearby structures. Before the procedure, ask your health care provider about changing or stopping your regular medicines. This is especially important if you are taking diabetes medicines or blood thinners. This information is not intended to replace advice given to you by your health care provider. Make sure you discuss any questions you have with your health care provider. Document Revised: 10/01/2020 Document Reviewed: 07/14/2020 Elsevier Patient Education  2024 [employer].

## 2023-06-03 NOTE — Progress Notes (Unsigned)
 06/03/2023 1:56 PM   Joshua [name] 05-01-1950 [id number]  Referring provider: Aileen Alexanders, NP [address] [place],  [place] [address]  Urological history: 1. Urethral stricture -per patient several years ago -cysto 2017 - NED   2. BPH with LU TS -PSA 2.5 in 10/2019   3. ED -contributing factors of age, hypothyroidism, HLD and HTN  Chief Complaint  Patient presents with   Benign Prostatic Hypertrophy   HPI: Joshua [name] is a 73 y.o. male who presents today for one month follow up.    Previous records reviewed.   At his visit on 04/06/2023.  I PSS 19/5.  PVR 17 mL.  He is having daytime frequency.  He states when he urinates in about 15 to 20 minutes he has to turn around and go right back to it.  This has been going on for several months.  Patient denies any modifying or aggravating factors.  Patient denies any recent UTI's, gross hematuria, dysuria or suprapubic/flank pain.  Patient denies any fevers, chills, nausea or vomiting.  He was given Gemtesa  samples and asked to return in one month.    I PSS 20/4  PVR 24 mL  He had been he said he saw no difference while taking the Gemtesa  samples.  His PCP gave him a prescription for the Gemtesa  samples to take until his appointment with us , but his insurance denied it stating he needed to take fesoterodine ER, solifenacin or tolterodine first and fail before they consider paying for the Gemtesa .  Patient denies any modifying or aggravating factors.  Patient denies any recent UTI's, gross hematuria, dysuria or suprapubic/flank pain.  Patient denies any fevers, chills, nausea or vomiting.     IPSS     Row Name 06/03/23 1000         International Prostate Symptom Score   How often have you had the sensation of not emptying your bladder? More than half the time     How often have you had to urinate less than every two hours? Almost always     How often have you found you stopped and started again several times  when you urinated? Less than 1 in 5 times     How often have you found it difficult to postpone urination? Almost always     How often have you had a weak urinary stream? Less than 1 in 5 times     How often have you had to strain to start urination? Less than 1 in 5 times     How many times did you typically get up at night to urinate? 3 Times     Total IPSS Score 20       Quality of Life due to urinary symptoms   If you were to spend the rest of your life with your urinary condition just the way it is now how would you feel about that? Mostly Disatisfied               Score:  1-7 Mild 8-19 Moderate 20-35 Severe     PMH: Past Medical History:  Diagnosis Date   Allergy    Amnesia 06/13/2014   B12 deficiency 06/13/2014   Benign essential HTN 10/30/2014   BPH (benign prostatic hyperplasia)    BPH with obstruction/lower urinary tract symptoms 05/15/2015   History of kidney stones    Hypogonadism in male    Hypothyroidism 10/30/2014    Surgical History: Past Surgical History:  Procedure  Laterality Date   COLONOSCOPY  March 2016   COLONOSCOPY WITH PROPOFOL  N/A 05/29/2020   Procedure: COLONOSCOPY WITH PROPOFOL ;  Surgeon: Luke Salaam, MD;  Location: [facility] ENDOSCOPY;  Service: Gastroenterology;  Laterality: N/A;   COLONOSCOPY WITH PROPOFOL  N/A 01/06/2021   Procedure: COLONOSCOPY WITH PROPOFOL ;  Surgeon: Luke Salaam, MD;  Location: [facility] ENDOSCOPY;  Service: Gastroenterology;  Laterality: N/A;   COLONOSCOPY WITH PROPOFOL  N/A 07/07/2022   Procedure: COLONOSCOPY WITH PROPOFOL ;  Surgeon: Luke Salaam, MD;  Location: [facility] ENDOSCOPY;  Service: Gastroenterology;  Laterality: N/A;   ESOPHAGOGASTRODUODENOSCOPY ENDOSCOPY  March 2016   EYE SURGERY Bilateral    cataracts   KNEE SURGERY Right    SHOULDER SURGERY Left    TONSILLECTOMY      Home Medications:  Allergies as of 06/03/2023       Reactions   Aspirin Hives   Celery Oil Swelling   Flomax  [tamsulosin  Hcl] Hives   Hydromorphone  Hives   Other Hives   Trout   Oysters [shellfish Allergy]    Amlodipine  Besylate Swelling   Ankle edema   Latex Rash        Medication List        Accurate as of Jun 03, 2023 11:59 PM. If you have any questions, ask your nurse or doctor.          STOP taking these medications    FLUoxetine  20 MG capsule Commonly known as: PROZAC        TAKE these medications    acetaminophen 500 MG tablet Commonly known as: TYLENOL Take 500 mg by mouth every 6 (six) hours as needed.   cetirizine 10 MG chewable tablet Commonly known as: ZYRTEC Chew 10 mg by mouth daily. Take once a day   cyanocobalamin  1000 MCG tablet Commonly known as: VITAMIN B12 Take 1,000 mcg by mouth daily.   EYE VITAMINS PO Take by mouth.   ICAPS AREDS 2 PO Take by mouth.   Gemtesa  75 MG Tabs Generic drug: Vibegron  Take 1 tablet (75 mg total) by mouth daily.   hydrocortisone 2.5 % lotion Apply topically.   levothyroxine  125 MCG tablet Commonly known as: SYNTHROID  Take 1 tablet (125 mcg total) by mouth daily.   losartan  100 MG tablet Commonly known as: COZAAR  Take 1 tablet (100 mg total) by mouth daily.        Allergies:  Allergies  Allergen Reactions   Aspirin Hives   Celery Oil Swelling   Flomax  [Tamsulosin  Hcl] Hives   Hydromorphone Hives   Other Hives    Trout   Oysters [Shellfish Allergy]    Amlodipine  Besylate Swelling    Ankle edema   Latex Rash    Family History: Family History  Problem Relation Age of Onset   Breast cancer Mother    COPD Father    Heart disease Father    Prostate cancer Father     Social History:  reports that he has never smoked. He has never used smokeless tobacco. He reports current alcohol use. He reports that he does not use drugs.  ROS: Pertinent ROS in HPI  Physical Exam: BP 121/80   Pulse 76   Ht 5\' 7"  (1.702 m)   Wt 223 lb (101.2 kg)   BMI 34.93 kg/m   Constitutional:  Well nourished. Alert and oriented, No acute  distress. HEENT: [place] AT, moist mucus membranes.  Trachea midline Cardiovascular: No clubbing, cyanosis, or edema. Respiratory: Normal respiratory effort, no increased work of breathing. Neurologic: Grossly intact, no focal deficits,  moving all 4 extremities. Psychiatric: Normal mood and affect.   Laboratory Data: Component     Latest Ref Rng 04/21/2023  TSH     0.450 - 4.500 uIU/mL 0.666    Component     Latest Ref Rng 04/21/2023  Cholesterol, Total     100 - 199 mg/dL 308   Triglycerides     0 - 149 mg/dL 657   HDL Cholesterol     >39 mg/dL 47   VLDL Cholesterol Cal     5 - 40 mg/dL 19   LDL Chol Calc (NIH)     0 - 99 mg/dL 846 (H)   Total CHOL/HDL Ratio     0.0 - 5.0 ratio 3.6     Legend: (H) High  Component     Latest Ref Rng 04/21/2023  Hemoglobin A1C     4.8 - 5.6 % 5.8 (H)   Est. average glucose Bld gHb Est-mCnc     mg/dL 962     Legend: (H) High  Component     Latest Ref Rng 04/21/2023  Glucose     70 - 99 mg/dL 98   BUN     8 - 27 mg/dL 21   Creatinine     9.52 - 1.27 mg/dL 8.41   BUN/Creatinine Ratio     10 - 24  23   Sodium     134 - 144 mmol/L 140   Potassium     3.5 - 5.2 mmol/L 3.9   Chloride     96 - 106 mmol/L 103   CO2     20 - 29 mmol/L 24   Calcium      8.6 - 10.2 mg/dL 9.6   Total Protein     6.0 - 8.5 g/dL 6.5   Albumin     3.8 - 4.8 g/dL 4.2   Globulin, Total     1.5 - 4.5 g/dL 2.3   Total Bilirubin     0.0 - 1.2 mg/dL 0.6   Alkaline Phosphatase     44 - 121 IU/L 74   AST     0 - 40 IU/L 16   ALT     0 - 44 IU/L 11   eGFR     >59 mL/min/1.73 90     Component     Latest Ref Rng 03/30/2023  Specific Gravity, UA     1.005 - 1.030  1.010   pH, UA     5.0 - 7.5  6.0   Color, UA     Yellow  Yellow   Appearance Ur     Clear  Clear   Leukocytes,UA     Negative  Negative   Protein,UA     Negative/Trace  Negative   Glucose, UA     Negative  Negative   Ketones, UA     Negative  Negative   RBC, UA     Negative   Negative   Bilirubin, UA     Negative  Negative   Urobilinogen, Ur     0.2 - 1.0 mg/dL 0.2   Nitrite, UA     Negative  Negative   Microscopic Examination Comment   I have reviewed the labs.   Pertinent Imaging:  06/03/23 10:41  Scan Result 24 ml     Assessment & Plan:    1. BPH with LUTS -most bothersome symptoms are urinary frequency  -continue conservative management, avoiding bladder irritants and timed voiding's - At this time, he  would like to forego any more medical therapy would like to be worked up to see if he is a candidate for an outlet procedure or if his stricture disease has recurred - I explained this would be done with cystoscopy and TRUS - Explained that the cystoscopy is a safe and common diagnostic test performed by one of our physicians in the office.  It consist of using a thin, lighted tube to look directly inside the bladder, prostate and urethra to evaluate the anatomy.  The procedure is brief, typically taking about 5 minutes. -This will enable us  to assess bladder health, diagnose and enlarged prostate, assess which BPH procedure may be most appropriate and rule out other bladder conditions (stricture disease, stones, cancer, etc.)  -Advised the patient that there are no restrictions to eating or drinking prior to the cystoscopy -They can continue to take all of their medications as prescribed -They can drive themselves to and from the appointment -I explained that during the procedure, the area around the urethra will be cleansed thoroughly, topical anesthetic will be applied to numb your urethra, the thin tube is then gently inserted through the urethra into your bladder while fluid flows through the tube to the bladder to enable better visualization -I explained the procedure is usually not painful, however there may be some discomfort (pinching feeling), and they may feel an urge to urinate, coolness or fullness in the bladder and then the cystoscope is  removed -After the cystoscopy, I advised them that they may experience urinary frequency, hematuria, dysuria which will resolve within 24 to 48 hours -Reviewed red flag signs (fever, bright red blood or blood clots in the urine, abdominal pain or difficulty urinating) and to contact the office immediately or seek treatment in the ED if they should experience any of these -The physician will discuss the results of the cystoscopy at the time of the procedure  - I explained that the TRUS consists of an ultrasound of the prostate which was performed by using a small probe inside the rectum and there is minor discomfort with this procedure  2. Urethral stricture disease -PVR demonstrates adequate emptying  -cystoscopy pending to evaluate for recurrence  3. OAB - failed Gemtesa  - cysto pending - If he has no stricture recurrence or evidence of BOO or tumor on cystoscopy, we may need to consider one of the OAB agents on his insurance formulary   Return for cysto/TRUS for BOO/stricture disease w/ Dr. Cherylene Corrente .  These notes generated with voice recognition software. I apologize for typographical errors.  Briant Camper  [facility] [address]  [address] [place], [place] [address] [phone]

## 2023-06-06 ENCOUNTER — Encounter: Payer: Self-pay | Admitting: Urology

## 2023-06-07 ENCOUNTER — Encounter: Payer: Self-pay | Admitting: Nurse Practitioner

## 2023-06-07 ENCOUNTER — Ambulatory Visit (INDEPENDENT_AMBULATORY_CARE_PROVIDER_SITE_OTHER): Admitting: Nurse Practitioner

## 2023-06-07 VITALS — BP 125/72 | HR 73 | Wt 210.0 lb

## 2023-06-07 DIAGNOSIS — Z636 Dependent relative needing care at home: Secondary | ICD-10-CM | POA: Diagnosis not present

## 2023-06-07 DIAGNOSIS — F339 Major depressive disorder, recurrent, unspecified: Secondary | ICD-10-CM

## 2023-06-07 DIAGNOSIS — Z1330 Encounter for screening examination for mental health and behavioral disorders, unspecified: Secondary | ICD-10-CM

## 2023-06-07 MED ORDER — FLUOXETINE HCL 20 MG PO CAPS: 20.0000 mg | ORAL_CAPSULE | Freq: Every day | ORAL | 0 refills | Status: DC

## 2023-06-07 NOTE — Progress Notes (Signed)
 BP 125/72 (BP Location: Right Arm, Patient Position: Sitting, Cuff Size: Normal)   Pulse 73   Wt 210 lb (95.3 kg)   BMI 32.89 kg/m    Subjective:    Patient ID: Joshua [name], male    DOB: 07/20/1950, 73 y.o.   MRN: [id number]  HPI: SHYNE CHICKERING is a 73 y.o. male  Chief Complaint  Patient presents with   Anxiety   CAREGIVER RELATED STRESS Patient states his symptoms are the same.  He feels tired.  He was not able to pick up the Fluoxetine  after his last visit.  He didn't stop the citalopram  after the last visit.  Does still feel like it is making him go to the bathroom more.  He has a LUTS procedure scheduled with Dr. Cherylene Corrente at the end of the month. Denies SI.  Flowsheet Row Office Visit from 06/07/2023 in [facility]  PHQ-9 Total Score 3         06/07/2023   10:28 AM 05/03/2023    9:59 AM 04/21/2023   11:15 AM 03/30/2023    1:53 PM  GAD 7 : Generalized Anxiety Score  Nervous, Anxious, on Edge 3 2 2  0  Control/stop worrying 0 0 0 0  Worry too much - different things 0 0 0 0  Trouble relaxing 0 0 0 0  Restless 0 0 0 0  Easily annoyed or irritable 0 0 0 0  Afraid - awful might happen 0 0 0 0  Total GAD 7 Score 3 2 2  0  Anxiety Difficulty Not difficult at all Not difficult at all Not difficult at all Not difficult at all      Relevant past medical, surgical, family and social history reviewed and updated as indicated. Interim medical history since our last visit reviewed. Allergies and medications reviewed and updated.  Review of Systems  Psychiatric/Behavioral:  Positive for dysphoric mood. Negative for suicidal ideas. The patient is nervous/anxious.     Per HPI unless specifically indicated above     Objective:    BP 125/72 (BP Location: Right Arm, Patient Position: Sitting, Cuff Size: Normal)   Pulse 73   Wt 210 lb (95.3 kg)   BMI 32.89 kg/m   Wt Readings from Last 3 Encounters:  06/07/23 210 lb (95.3 kg)  06/03/23 223 lb  (101.2 kg)  05/03/23 213 lb 3.2 oz (96.7 kg)    Physical Exam Vitals and nursing note reviewed.  Constitutional:      General: He is not in acute distress.    Appearance: Normal appearance. He is not ill-appearing, toxic-appearing or diaphoretic.  HENT:     Head: Normocephalic.     Right Ear: External ear normal.     Left Ear: External ear normal.     Nose: Nose normal. No congestion or rhinorrhea.     Mouth/Throat:     Mouth: Mucous membranes are moist.  Eyes:     General:        Right eye: No discharge.        Left eye: No discharge.     Extraocular Movements: Extraocular movements intact.     Conjunctiva/sclera: Conjunctivae normal.     Pupils: Pupils are equal, round, and reactive to light.  Cardiovascular:     Rate and Rhythm: Normal rate and regular rhythm.     Heart sounds: No murmur heard. Pulmonary:     Effort: Pulmonary effort is normal. No respiratory distress.     Breath  sounds: Normal breath sounds. No wheezing, rhonchi or rales.  Abdominal:     General: Abdomen is flat. Bowel sounds are normal.  Musculoskeletal:     Cervical back: Normal range of motion and neck supple.  Skin:    General: Skin is warm and dry.     Capillary Refill: Capillary refill takes less than 2 seconds.  Neurological:     General: No focal deficit present.     Mental Status: He is alert and oriented to person, place, and time.  Psychiatric:        Mood and Affect: Mood normal.        Behavior: Behavior normal.        Thought Content: Thought content normal.        Judgment: Judgment normal.     Results for orders placed or performed in visit on 06/03/23  Bladder Scan (Post Void Residual) in office   Collection Time: 06/03/23 10:41 AM  Result Value Ref Range   Scan Result 24 ml       Assessment & Plan:   Problem List Items Addressed This Visit       Other   Caregiver stress   Depression, recurrent (HCC) - Primary   Chronic.  Not well controlled.  Will stop Citalopram .   New prescription of Fluoxetine  sent to the pharmacy.  Follow up in 6 weeks.  Call sooner if concerns arise.       Relevant Medications   FLUoxetine  (PROZAC ) 20 MG capsule   Other Visit Diagnoses       Encounter for behavioral health screening            Follow up plan: Return in about 6 weeks (around 07/19/2023) for Depression/Anxiety FU.

## 2023-06-07 NOTE — Assessment & Plan Note (Signed)
 Chronic.  Not well controlled.  Will stop Citalopram .  New prescription of Fluoxetine  sent to the pharmacy.  Follow up in 6 weeks.  Call sooner if concerns arise.

## 2023-06-08 DIAGNOSIS — M9905 Segmental and somatic dysfunction of pelvic region: Secondary | ICD-10-CM | POA: Diagnosis not present

## 2023-06-08 DIAGNOSIS — M5432 Sciatica, left side: Secondary | ICD-10-CM | POA: Diagnosis not present

## 2023-06-08 DIAGNOSIS — M9903 Segmental and somatic dysfunction of lumbar region: Secondary | ICD-10-CM | POA: Diagnosis not present

## 2023-06-08 DIAGNOSIS — M5136 Other intervertebral disc degeneration, lumbar region with discogenic back pain only: Secondary | ICD-10-CM | POA: Diagnosis not present

## 2023-06-22 DIAGNOSIS — M5432 Sciatica, left side: Secondary | ICD-10-CM | POA: Diagnosis not present

## 2023-06-22 DIAGNOSIS — M5136 Other intervertebral disc degeneration, lumbar region with discogenic back pain only: Secondary | ICD-10-CM | POA: Diagnosis not present

## 2023-06-22 DIAGNOSIS — M9903 Segmental and somatic dysfunction of lumbar region: Secondary | ICD-10-CM | POA: Diagnosis not present

## 2023-06-22 DIAGNOSIS — M9905 Segmental and somatic dysfunction of pelvic region: Secondary | ICD-10-CM | POA: Diagnosis not present

## 2023-07-01 ENCOUNTER — Ambulatory Visit (INDEPENDENT_AMBULATORY_CARE_PROVIDER_SITE_OTHER): Admitting: Urology

## 2023-07-01 VITALS — BP 139/90 | HR 74 | Ht 67.0 in | Wt 223.0 lb

## 2023-07-01 DIAGNOSIS — N3281 Overactive bladder: Secondary | ICD-10-CM

## 2023-07-01 LAB — MICROSCOPIC EXAMINATION: WBC, UA: >30 /HPF — AB (ref 0–5)

## 2023-07-01 LAB — URINALYSIS, COMPLETE
Bilirubin, UA: NEGATIVE
Glucose, UA: NEGATIVE
Ketones, UA: NEGATIVE
Nitrite, UA: NEGATIVE
Protein,UA: NEGATIVE
Specific Gravity, UA: 1.015 (ref 1.005–1.030)
Urobilinogen, Ur: 0.2 mg/dL (ref 0.2–1.0)
pH, UA: 6 (ref 5.0–7.5)

## 2023-07-01 NOTE — Progress Notes (Signed)
 Scheduled for cystoscopy/TRUS today however urinalysis with 2+ leukocytes dipstick and >30 WBC on microscopy.  Cystoscopy was postponed and urine culture ordered.  Will reschedule once urine culture results are back.

## 2023-07-05 LAB — CULTURE, URINE COMPREHENSIVE

## 2023-07-06 ENCOUNTER — Telehealth: Payer: Self-pay | Admitting: Urology

## 2023-07-06 ENCOUNTER — Other Ambulatory Visit: Payer: Self-pay | Admitting: *Deleted

## 2023-07-06 DIAGNOSIS — M9903 Segmental and somatic dysfunction of lumbar region: Secondary | ICD-10-CM | POA: Diagnosis not present

## 2023-07-06 DIAGNOSIS — M9905 Segmental and somatic dysfunction of pelvic region: Secondary | ICD-10-CM | POA: Diagnosis not present

## 2023-07-06 DIAGNOSIS — M5432 Sciatica, left side: Secondary | ICD-10-CM | POA: Diagnosis not present

## 2023-07-06 DIAGNOSIS — M5136 Other intervertebral disc degeneration, lumbar region with discogenic back pain only: Secondary | ICD-10-CM | POA: Diagnosis not present

## 2023-07-06 MED ORDER — AMOXICILLIN 875 MG PO TABS: 875.0000 mg | ORAL_TABLET | Freq: Two times a day (BID) | ORAL | 0 refills | Status: DC

## 2023-07-06 MED ORDER — AMOXICILLIN 875 MG PO TABS: 875.0000 mg | ORAL_TABLET | Freq: Two times a day (BID) | ORAL | 0 refills | Status: AC

## 2023-07-06 NOTE — Telephone Encounter (Signed)
 Error

## 2023-07-06 NOTE — Telephone Encounter (Signed)
 Notified patient as instructed, patient pleased. Discussed follow-up appointments, patient agrees

## 2023-07-06 NOTE — Addendum Note (Signed)
 Addended by: Westley Hammers on: 07/06/2023 04:34 PM   Modules accepted: Orders

## 2023-07-06 NOTE — Telephone Encounter (Signed)
 Patient's daughter Joshua [name] called requesting a call back regarding Urine results and antibiotic be sent to [facility] in [place], so patient can start treatment ASAP. Please advise patient.

## 2023-07-06 NOTE — Telephone Encounter (Signed)
 Send Rx amoxicillin  875 mg twice daily x 14 days.  Schedule cystoscopy 07/13/2023 at 815.  Okay to double book

## 2023-07-08 ENCOUNTER — Other Ambulatory Visit: Payer: Self-pay | Admitting: *Deleted

## 2023-07-08 ENCOUNTER — Ambulatory Visit: Payer: Self-pay | Admitting: Urology

## 2023-07-13 ENCOUNTER — Ambulatory Visit (INDEPENDENT_AMBULATORY_CARE_PROVIDER_SITE_OTHER): Admitting: Urology

## 2023-07-13 VITALS — BP 136/88 | HR 71 | Ht 67.0 in | Wt 208.2 lb

## 2023-07-13 DIAGNOSIS — N138 Other obstructive and reflux uropathy: Secondary | ICD-10-CM

## 2023-07-13 DIAGNOSIS — N3281 Overactive bladder: Secondary | ICD-10-CM | POA: Diagnosis not present

## 2023-07-13 MED ORDER — SOLIFENACIN SUCCINATE 5 MG PO TABS: 5.0000 mg | ORAL_TABLET | Freq: Every day | ORAL | 0 refills | Status: DC

## 2023-07-13 NOTE — Progress Notes (Signed)
 07/13/23  Chief Complaint  Patient presents with   Cysto/TRUS    HPI: 73 y.o. year-old male with refractory urinary symptoms related to BPH who presents today to the office for cystoscopy and prostate sizing   Please see previous notes for details.     Blood pressure 136/88, pulse 71, height 5' 7 (1.702 m), weight 208 lb 4 oz (94.5 kg). NED. A&Ox3.       Cystoscopy Procedure Note  Patient identification was confirmed, informed consent was obtained, and patient was prepped using Betadine solution.  Lidocaine  jelly was administered per urethral meatus.    Preoperative abx where received prior to procedure.     Pre-Procedure: - Inspection reveals a normal caliber urethral meatus.  Procedure: The flexible cystoscope was introduced without difficulty - No urethral strictures/lesions are present. - Moderate lateral enlargement prostate  - Mild elevation bladder neck - Bilateral ureteral orifices identified - Bladder mucosa  reveals no ulcers, tumors, or lesions - No bladder stones - Mild trabeculation  Retroflexion shows no intravesical median lobe or mucosal abnormalities   Post-Procedure: - Patient tolerated the procedure well   Prostate transrectal ultrasound sizing   Informed consent was obtained after discussing risks/benefits of the procedure.  A time out was performed to ensure correct patient identity.   Pre-Procedure: -Transrectal probe was placed without difficulty -Transrectal Ultrasound performed revealing a 46 gm prostate measuring 4.04 x 4.67 x 4.67 cm (length) -No significant hypoechoic or median lobe noted      Assessment/ Plan:  BPH with LUTS Today patient states he is not interested in pursuing outlet procedures at this time.  We did discuss minimally invasive options including UroLift, TURP and HoLEP He is inquiring about any other medical therapy.  His most bothersome symptoms are storage related.  PVR today was 24 mL Will add solifenacin  5 mg  daily PA follow-up 1 month for symptom recheck and PVR   Geraline Knapp, MD

## 2023-07-15 ENCOUNTER — Encounter: Payer: Self-pay | Admitting: Urology

## 2023-07-19 ENCOUNTER — Encounter: Payer: Self-pay | Admitting: Nurse Practitioner

## 2023-07-19 ENCOUNTER — Ambulatory Visit (INDEPENDENT_AMBULATORY_CARE_PROVIDER_SITE_OTHER): Admitting: Nurse Practitioner

## 2023-07-19 VITALS — BP 117/73 | HR 71 | Temp 98.6°F | Resp 16 | Ht 67.01 in | Wt 209.0 lb

## 2023-07-19 DIAGNOSIS — Z636 Dependent relative needing care at home: Secondary | ICD-10-CM

## 2023-07-19 MED ORDER — FLUOXETINE HCL 20 MG PO CAPS: 20.0000 mg | ORAL_CAPSULE | Freq: Every day | ORAL | 0 refills | Status: DC

## 2023-07-19 NOTE — Progress Notes (Signed)
 BP 117/73 (BP Location: Right Arm, Patient Position: Sitting, Cuff Size: Large)   Pulse 71   Temp 98.6 F (37 C) (Oral)   Resp 16   Ht 5' 7.01 (1.702 m)   Wt 209 lb (94.8 kg)   SpO2 96%   BMI 32.73 kg/m    Subjective:    Patient ID: Joshua [name], male    DOB: 1950-11-20, 73 y.o.   MRN: [id number]  HPI: Joshua [name] is a 73 y.o. male  Chief Complaint  Patient presents with   Depression    Managing well right now. Caring for his wife and still feels tired through out the day.    Hypertension    Not often checking at home.    Urinary    Had procedure and felt it went well. Due to start Vesicare  today.    CAREGIVER RELATED STRESS Patient states he has been taking his Fluoxetine .  He feels like it is working okay.  He feels like he is managing the stress of caring for his wife better.  Also feels like he is crying less than before starting the medication.  He still feels tired.  Denies SI.  Flowsheet Row Office Visit from 07/19/2023 in [facility]  PHQ-9 Total Score 0      07/19/2023    1:25 PM 06/07/2023   10:28 AM 05/03/2023    9:59 AM 04/21/2023   11:15 AM  GAD 7 : Generalized Anxiety Score  Nervous, Anxious, on Edge 1 3 2 2   Control/stop worrying 3 0 0 0  Worry too much - different things 0 0 0 0  Trouble relaxing 0 0 0 0  Restless 0 0 0 0  Easily annoyed or irritable 0 0 0 0  Afraid - awful might happen 0 0 0 0  Total GAD 7 Score 4 3 2 2   Anxiety Difficulty  Not difficult at all Not difficult at all Not difficult at all      Relevant past medical, surgical, family and social history reviewed and updated as indicated. Interim medical history since our last visit reviewed. Allergies and medications reviewed and updated.  Review of Systems  Psychiatric/Behavioral:  Positive for dysphoric mood. Negative for suicidal ideas. The patient is nervous/anxious.     Per HPI unless specifically indicated above     Objective:    BP  117/73 (BP Location: Right Arm, Patient Position: Sitting, Cuff Size: Large)   Pulse 71   Temp 98.6 F (37 C) (Oral)   Resp 16   Ht 5' 7.01 (1.702 m)   Wt 209 lb (94.8 kg)   SpO2 96%   BMI 32.73 kg/m   Wt Readings from Last 3 Encounters:  07/19/23 209 lb (94.8 kg)  07/13/23 208 lb 4 oz (94.5 kg)  07/01/23 223 lb (101.2 kg)    Physical Exam Vitals and nursing note reviewed.  Constitutional:      General: He is not in acute distress.    Appearance: Normal appearance. He is not ill-appearing, toxic-appearing or diaphoretic.  HENT:     Head: Normocephalic.     Right Ear: External ear normal.     Left Ear: External ear normal.     Nose: Nose normal. No congestion or rhinorrhea.     Mouth/Throat:     Mouth: Mucous membranes are moist.   Eyes:     General:        Right eye: No discharge.  Left eye: No discharge.     Extraocular Movements: Extraocular movements intact.     Conjunctiva/sclera: Conjunctivae normal.     Pupils: Pupils are equal, round, and reactive to light.    Cardiovascular:     Rate and Rhythm: Normal rate and regular rhythm.     Heart sounds: No murmur heard. Pulmonary:     Effort: Pulmonary effort is normal. No respiratory distress.     Breath sounds: Normal breath sounds. No wheezing, rhonchi or rales.  Abdominal:     General: Abdomen is flat. Bowel sounds are normal.   Musculoskeletal:     Cervical back: Normal range of motion and neck supple.   Skin:    General: Skin is warm and dry.     Capillary Refill: Capillary refill takes less than 2 seconds.   Neurological:     General: No focal deficit present.     Mental Status: He is alert and oriented to person, place, and time.   Psychiatric:        Mood and Affect: Mood normal.        Behavior: Behavior normal.        Thought Content: Thought content normal.        Judgment: Judgment normal.     Results for orders placed or performed in visit on 07/01/23  Microscopic Examination    Collection Time: 07/01/23  8:49 AM   Urine  Result Value Ref Range   WBC, UA >30 (A) 0 - 5 /hpf   RBC, Urine 0-2 0 - 2 /hpf   Epithelial Cells (non renal) 0-10 0 - 10 /hpf   Bacteria, UA Moderate (A) None seen/Few  Urinalysis, Complete   Collection Time: 07/01/23  8:49 AM  Result Value Ref Range   Specific Gravity, UA 1.015 1.005 - 1.030   pH, UA 6.0 5.0 - 7.5   Color, UA Yellow Yellow   Appearance Ur Clear Clear   Leukocytes,UA 2+ (A) Negative   Protein,UA Negative Negative/Trace   Glucose, UA Negative Negative   Ketones, UA Negative Negative   RBC, UA Trace (A) Negative   Bilirubin, UA Negative Negative   Urobilinogen, Ur 0.2 0.2 - 1.0 mg/dL   Nitrite, UA Negative Negative   Microscopic Examination See below:   CULTURE, URINE COMPREHENSIVE   Collection Time: 07/01/23  9:26 AM   Specimen: Urine   UR  Result Value Ref Range   Urine Culture, Comprehensive Final report (A)    Organism ID, Bacteria Comment (A)       Assessment & Plan:   Problem List Items Addressed This Visit       Other   Caregiver stress - Primary   Chronic.  Improved from prior.  Continue with Fluoxetine  20mg  daily.  Refills sent today.  Follow up in 3 months.  Call sooner if concerns arise.         Follow up plan: Return in about 3 months (around 10/19/2023) for HTN, HLD, DM2 FU.

## 2023-07-19 NOTE — Assessment & Plan Note (Signed)
 Chronic.  Improved from prior.  Continue with Fluoxetine  20mg  daily.  Refills sent today.  Follow up in 3 months.  Call sooner if concerns arise.

## 2023-07-20 ENCOUNTER — Other Ambulatory Visit: Payer: Self-pay | Admitting: Nurse Practitioner

## 2023-07-20 DIAGNOSIS — M5136 Other intervertebral disc degeneration, lumbar region with discogenic back pain only: Secondary | ICD-10-CM | POA: Diagnosis not present

## 2023-07-20 DIAGNOSIS — M9903 Segmental and somatic dysfunction of lumbar region: Secondary | ICD-10-CM | POA: Diagnosis not present

## 2023-07-20 DIAGNOSIS — M5432 Sciatica, left side: Secondary | ICD-10-CM | POA: Diagnosis not present

## 2023-07-20 DIAGNOSIS — M9905 Segmental and somatic dysfunction of pelvic region: Secondary | ICD-10-CM | POA: Diagnosis not present

## 2023-07-22 NOTE — Telephone Encounter (Signed)
 Requested Prescriptions  Refused Prescriptions Disp Refills   citalopram  (CELEXA ) 20 MG tablet [Pharmacy Med Name: Citalopram  Hydrobromide Oral Tablet 20 MG] 90 tablet 3    Sig: TAKE 1 TABLET EVERY DAY     Psychiatry:  Antidepressants - SSRI Passed - 07/22/2023 12:46 PM      Passed - Completed PHQ-2 or PHQ-9 in the last 360 days      Passed - Valid encounter within last 6 months    Recent Outpatient Visits           3 days ago Caregiver stress   [facility] Aileen Alexanders, NP   1 month ago Depression, recurrent [facility])   [facility] Aileen Alexanders, NP   2 months ago OAB (overactive bladder)   [facility] Aileen Alexanders, NP   3 months ago Benign essential HTN   [facility] Aileen Alexanders, NP   3 months ago Dysuria   [facility] Aileen Alexanders, NP       Future Appointments             In 3 weeks McG[place]an, Nyra Bellis [facility] [place]

## 2023-07-25 ENCOUNTER — Ambulatory Visit: Admitting: Nurse Practitioner

## 2023-07-27 DIAGNOSIS — M9905 Segmental and somatic dysfunction of pelvic region: Secondary | ICD-10-CM | POA: Diagnosis not present

## 2023-07-27 DIAGNOSIS — M5136 Other intervertebral disc degeneration, lumbar region with discogenic back pain only: Secondary | ICD-10-CM | POA: Diagnosis not present

## 2023-07-27 DIAGNOSIS — M9903 Segmental and somatic dysfunction of lumbar region: Secondary | ICD-10-CM | POA: Diagnosis not present

## 2023-07-27 DIAGNOSIS — M5432 Sciatica, left side: Secondary | ICD-10-CM | POA: Diagnosis not present

## 2023-08-09 ENCOUNTER — Other Ambulatory Visit: Payer: Self-pay | Admitting: Urology

## 2023-08-09 DIAGNOSIS — N3281 Overactive bladder: Secondary | ICD-10-CM

## 2023-08-10 DIAGNOSIS — M5136 Other intervertebral disc degeneration, lumbar region with discogenic back pain only: Secondary | ICD-10-CM | POA: Diagnosis not present

## 2023-08-10 DIAGNOSIS — M9903 Segmental and somatic dysfunction of lumbar region: Secondary | ICD-10-CM | POA: Diagnosis not present

## 2023-08-10 DIAGNOSIS — M9905 Segmental and somatic dysfunction of pelvic region: Secondary | ICD-10-CM | POA: Diagnosis not present

## 2023-08-10 DIAGNOSIS — M5432 Sciatica, left side: Secondary | ICD-10-CM | POA: Diagnosis not present

## 2023-08-11 NOTE — Progress Notes (Signed)
 08/12/2023 9:49 AM   Lynwood FORBES [place] 03-05-1950 [id number]  Referring provider: Melvin Pao, NP [address] [place],  [place] [address]  Urological history: 1. Urethral stricture -per patient several years ago -cysto 2017 - NED -cysto (07/2023) no strictures present   2. BPH with LU TS -PSA 2.5 in 10/2019 -cysto (07/2023) moderate lateral enlargement of the prostate and mild trabeculation -TRUS (07/2023) 46 grams   3. ED  Chief Complaint  Patient presents with   Follow-up   HPI: Joshua [name] is a 73 y.o. male who presents today for one month follow up.    Previous records reviewed.   He failed Gemtesa  for symptoms of urinary frequency.  He underwent cystoscopy and TRUS and was found to have moderate lateral enlargement of the prostate and mild trabeculation.  He is placed on solifenacin  5 mg daily.  He reports a reduction of 30% in his urinary symptoms.  He reports dry mouth, but he is managing it with Biotene.  Patient denies any modifying or aggravating factors.  Patient denies any recent UTI's, gross hematuria, dysuria or suprapubic/flank pain.  Patient denies any fevers, chills, nausea or vomiting.    PVR 76 mL   PMH: Past Medical History:  Diagnosis Date   Allergy    Amnesia 06/13/2014   B12 deficiency 06/13/2014   Benign essential HTN 10/30/2014   BPH (benign prostatic hyperplasia)    BPH with obstruction/lower urinary tract symptoms 05/15/2015   History of kidney stones    Hypogonadism in male    Hypothyroidism 10/30/2014    Surgical History: Past Surgical History:  Procedure Laterality Date   COLONOSCOPY  March 2016   COLONOSCOPY WITH PROPOFOL  N/A 05/29/2020   Procedure: COLONOSCOPY WITH PROPOFOL ;  Surgeon: Therisa Bi, MD;  Location: [facility] ENDOSCOPY;  Service: Gastroenterology;  Laterality: N/A;   COLONOSCOPY WITH PROPOFOL  N/A 01/06/2021   Procedure: COLONOSCOPY WITH PROPOFOL ;  Surgeon: Therisa Bi, MD;  Location: [facility] ENDOSCOPY;  Service:  Gastroenterology;  Laterality: N/A;   COLONOSCOPY WITH PROPOFOL  N/A 07/07/2022   Procedure: COLONOSCOPY WITH PROPOFOL ;  Surgeon: Therisa Bi, MD;  Location: [facility] ENDOSCOPY;  Service: Gastroenterology;  Laterality: N/A;   ESOPHAGOGASTRODUODENOSCOPY ENDOSCOPY  March 2016   EYE SURGERY Bilateral    cataracts   KNEE SURGERY Right    SHOULDER SURGERY Left    TONSILLECTOMY      Home Medications:  Allergies as of 08/12/2023       Reactions   Aspirin Hives   Celery Oil Swelling   Flomax  [tamsulosin  Hcl] Hives   Hydromorphone Hives   Other Hives   Trout   Oysters [shellfish Allergy]    Amlodipine  Besylate Swelling   Ankle edema   Latex Rash        Medication List        Accurate as of August 12, 2023  9:49 AM. If you have any questions, ask your nurse or doctor.          STOP taking these medications    cetirizine 10 MG chewable tablet Commonly known as: ZYRTEC       TAKE these medications    acetaminophen 500 MG tablet Commonly known as: TYLENOL Take 500 mg by mouth every 6 (six) hours as needed.   cyanocobalamin  1000 MCG tablet Commonly known as: VITAMIN B12 Take 1,000 mcg by mouth daily.   EYE VITAMINS PO Take by mouth.   ICAPS AREDS 2 PO Take by mouth.   FLUoxetine  20 MG capsule Commonly known  as: PROZAC  Take 1 capsule (20 mg total) by mouth daily.   hydrocortisone 2.5 % lotion Apply topically.   levothyroxine  125 MCG tablet Commonly known as: SYNTHROID  Take 1 tablet (125 mcg total) by mouth daily.   losartan  100 MG tablet Commonly known as: COZAAR  Take 1 tablet (100 mg total) by mouth daily.   solifenacin  5 MG tablet Commonly known as: VESICARE  Take 1 tablet (5 mg total) by mouth daily.        Allergies:  Allergies  Allergen Reactions   Aspirin Hives   Celery Oil Swelling   Flomax  [Tamsulosin  Hcl] Hives   Hydromorphone Hives   Other Hives    Trout   Oysters [Shellfish Allergy]    Amlodipine  Besylate Swelling    Ankle edema    Latex Rash    Family History: Family History  Problem Relation Age of Onset   Breast cancer Mother    COPD Father    Heart disease Father    Prostate cancer Father     Social History:  reports that he has never smoked. He has never used smokeless tobacco. He reports current alcohol use. He reports that he does not use drugs.  ROS: Pertinent ROS in HPI  Physical Exam: BP 116/73   Pulse 61   Constitutional:  Well nourished. Alert and oriented, No acute distress. HEENT: [place] AT, moist mucus membranes.  Trachea midline Cardiovascular: No clubbing, cyanosis, or edema. Respiratory: Normal respiratory effort, no increased work of breathing. Neurologic: Grossly intact, no focal deficits, moving all 4 extremities. Psychiatric: Normal mood and affect.   Laboratory Data: N/A   Pertinent Imaging:  08/12/23 09:19  Scan Result 76 ml    Assessment & Plan:    1. BPH with LU TS - improving symptoms  - no signs of retention, infection or malignancy  - PVR < 300 cc  - most bothersome symptoms are urgency - encouraged avoiding bladder irritants, fluid restriction before bedtime and timed voiding's - Continue Vesicare  5 mg daily  - educated on red flag symptoms: acute retention, gross hematuria, fever, severe pain - advised to call clinic or go to the ED if these occur - return to clinic in 12 months symptom re-evaluation    2. Urethral stricture disease - cysto in June did not demonstrate any stricture disease   3. OAB -  continue Vesicare  5 mg daily   Return in about 1 year (around 08/11/2024) for PVR and symptom recheck.  These notes generated with voice recognition software. I apologize for typographical errors.  CLOTILDA HELON RIGGERS  [facility] [address]  [address] [place], [place] [address] [phone]

## 2023-08-12 ENCOUNTER — Ambulatory Visit (INDEPENDENT_AMBULATORY_CARE_PROVIDER_SITE_OTHER): Admitting: Urology

## 2023-08-12 ENCOUNTER — Encounter: Payer: Self-pay | Admitting: Urology

## 2023-08-12 VITALS — BP 116/73 | HR 61

## 2023-08-12 DIAGNOSIS — N138 Other obstructive and reflux uropathy: Secondary | ICD-10-CM

## 2023-08-12 DIAGNOSIS — N401 Enlarged prostate with lower urinary tract symptoms: Secondary | ICD-10-CM | POA: Diagnosis not present

## 2023-08-12 DIAGNOSIS — N3281 Overactive bladder: Secondary | ICD-10-CM

## 2023-08-12 DIAGNOSIS — N35819 Other urethral stricture, male, unspecified site: Secondary | ICD-10-CM | POA: Diagnosis not present

## 2023-08-12 LAB — BLADDER SCAN AMB NON-IMAGING: Scan Result: 76

## 2023-08-12 MED ORDER — SOLIFENACIN SUCCINATE 5 MG PO TABS: 5.0000 mg | ORAL_TABLET | Freq: Every day | ORAL | 2 refills | Status: AC

## 2023-08-26 NOTE — Progress Notes (Signed)
 [facility] [address] [place], [place] [address]  Pulmonary Sleep Medicine   Office Visit Note  Patient Name: Joshua [name] DOB: 02/20/1950 MRN [id number]    Chief Complaint: Obstructive Sleep Apnea visit  Brief History:  Joshua [name] is seen today for an annual follow up visit for APAP@ 9-17 cmH2O. The patient has a 10 year history of sleep apnea. Patient is using PAP nightly.  The patient feels rested after sleeping with PAP.  The patient reports benefiting from PAP use. Reported sleepiness is  improved and the Epworth Sleepiness Score is 6 out of 24. The patient does take naps. The patient complains of the following: patient is in need of a replacement unit as his current unit has reached maximum lifetime expectancy and is currently out of warranty.  The compliance download shows 89% compliance with an average use time of 6 hours 26 minutes. The AHI is 3.3.  The patient does complain of limb movements disrupting sleep. The patient continues to require PAP therapy in order to eliminate sleep apnea.SABRA  ROS  General: (-) fever, (-) chills, (-) night sweat Nose and Sinuses: (-) nasal stuffiness or itchiness, (-) postnasal drip, (-) nosebleeds, (-) sinus trouble. Mouth and Throat: (-) sore throat, (-) hoarseness. Neck: (-) swollen glands, (-) enlarged thyroid , (-) neck pain. Respiratory: - cough, - shortness of breath, - wheezing. Neurologic: - numbness, - tingling. Psychiatric: - anxiety, - depression   Current Medication: Outpatient Encounter Medications as of 08/29/2023  Medication Sig Note   acetaminophen (TYLENOL) 500 MG tablet Take 500 mg by mouth every 6 (six) hours as needed.    cyanocobalamin  (VITAMIN B12) 1000 MCG tablet Take 1,000 mcg by mouth daily.    FLUoxetine  (PROZAC ) 20 MG capsule Take 1 capsule (20 mg total) by mouth daily.    hydrocortisone 2.5 % lotion Apply topically. 01/04/2023: prn   levothyroxine  (SYNTHROID ) 125 MCG tablet Take 1 tablet (125 mcg  total) by mouth daily.    losartan  (COZAAR ) 100 MG tablet Take 1 tablet (100 mg total) by mouth daily.    Multiple Vitamins-Minerals (EYE VITAMINS PO) Take by mouth.    Multiple Vitamins-Minerals (ICAPS AREDS 2 PO) Take by mouth.    solifenacin  (VESICARE ) 5 MG tablet Take 1 tablet (5 mg total) by mouth daily.    No facility-administered encounter medications on file as of 08/29/2023.    Surgical History: Past Surgical History:  Procedure Laterality Date   COLONOSCOPY  March 2016   COLONOSCOPY WITH PROPOFOL  N/A 05/29/2020   Procedure: COLONOSCOPY WITH PROPOFOL ;  Surgeon: Therisa Bi, MD;  Location: [facility] ENDOSCOPY;  Service: Gastroenterology;  Laterality: N/A;   COLONOSCOPY WITH PROPOFOL  N/A 01/06/2021   Procedure: COLONOSCOPY WITH PROPOFOL ;  Surgeon: Therisa Bi, MD;  Location: [facility] ENDOSCOPY;  Service: Gastroenterology;  Laterality: N/A;   COLONOSCOPY WITH PROPOFOL  N/A 07/07/2022   Procedure: COLONOSCOPY WITH PROPOFOL ;  Surgeon: Therisa Bi, MD;  Location: [facility] ENDOSCOPY;  Service: Gastroenterology;  Laterality: N/A;   ESOPHAGOGASTRODUODENOSCOPY ENDOSCOPY  March 2016   EYE SURGERY Bilateral    cataracts   KNEE SURGERY Right    SHOULDER SURGERY Left    TONSILLECTOMY      Medical History: Past Medical History:  Diagnosis Date   Allergy    Amnesia 06/13/2014   B12 deficiency 06/13/2014   Benign essential HTN 10/30/2014   BPH (benign prostatic hyperplasia)    BPH with obstruction/lower urinary tract symptoms 05/15/2015   History of kidney stones    Hypogonadism in male  Hypothyroidism 10/30/2014    Family History: Non contributory to the present illness  Social History: Social History   Socioeconomic History   Marital status: Married    Spouse name: Rock   Number of children: 2   Years of education: Not on file   Highest education level: High school graduate  Occupational History   Occupation: retired  Tobacco Use   Smoking status: Never   Smokeless tobacco: Never   Vaping Use   Vaping status: Never Used  Substance and Sexual Activity   Alcohol use: Yes    Comment: swig of wine monthly or less   Drug use: No   Sexual activity: Not Currently  Other Topics Concern   Not on file  Social History Narrative   Not on file   Social Drivers of Health   Financial Resource Strain: Low Risk  (01/04/2023)   Overall Financial Resource Strain (CARDIA)    Difficulty of Paying Living Expenses: Not hard at all  Food Insecurity: No Food Insecurity (01/04/2023)   Hunger Vital Sign    Worried About Running Out of Food in the Last Year: Never true    Ran Out of Food in the Last Year: Never true  Transportation Needs: No Transportation Needs (01/04/2023)   PRAPARE - Administrator, Civil Service (Medical): No    Lack of Transportation (Non-Medical): No  Physical Activity: Inactive (01/04/2023)   Exercise Vital Sign    Days of Exercise per Week: 0 days    Minutes of Exercise per Session: 0 min  Stress: No Stress Concern Present (01/04/2023)   [employer] of Occupational Health - Occupational Stress Questionnaire    Feeling of Stress : Only a little  Social Connections: Moderately Isolated (01/04/2023)   Social Connection and Isolation Panel    Frequency of Communication with Friends and Family: More than three times a week    Frequency of Social Gatherings with Friends and Family: Three times a week    Attends Religious Services: Never    Active Member of Clubs or Organizations: No    Attends Banker Meetings: Never    Marital Status: Married  Catering manager Violence: Not At Risk (01/04/2023)   Humiliation, Afraid, Rape, and Kick questionnaire    Fear of Current or Ex-Partner: No    Emotionally Abused: No    Physically Abused: No    Sexually Abused: No    Vital Signs: There were no vitals taken for this visit. There is no height or weight on file to calculate BMI.    Examination: General Appearance: The patient is  well-developed, well-nourished, and in no distress. Neck Circumference: 47 cm Skin: Gross inspection of skin unremarkable. Head: normocephalic, no gross deformities. Eyes: no gross deformities noted. ENT: ears appear grossly normal Neurologic: Alert and oriented. No involuntary movements.  STOP BANG RISK ASSESSMENT S (snore) Have you been told that you snore?     NO   T (tired) Are you often tired, fatigued, or sleepy during the day?   YES  O (obstruction) Do you stop breathing, choke, or gasp during sleep? NO   P (pressure) Do you have or are you being treated for high blood pressure? YES   B (BMI) Is your body index greater than 35 kg/m? NO   A (age) Are you 47 years old or older? YES   N (neck) Do you have a neck circumference greater than 16 inches?   YES   G (gender) Are you a  male? YES   TOTAL STOP/BANG "YES" ANSWERS 5       A STOP-Bang score of 2 or less is considered low risk, and a score of 5 or more is high risk for having either moderate or severe OSA. For people who score 3 or 4, doctors may need to perform further assessment to determine how likely they are to have OSA.         EPWORTH SLEEPINESS SCALE:  Scale:  (0)= no chance of dozing; (1)= slight chance of dozing; (2)= moderate chance of dozing; (3)= high chance of dozing  Chance  Situtation    Sitting and reading: 1    Watching TV: 1    Sitting Inactive in public: 1    As a passenger in car: 0      Lying down to rest: 3    Sitting and talking: 0    Sitting quielty after lunch: 0    In a car, stopped in traffic: 0   TOTAL SCORE:   6 out of 24    SLEEP STUDIES:  PSG - 06/14/13 - AHI 22/hr, Supine AHI 59/hr, min Sp02 88% Titration - 5.28.15 - CPAP @ 15cmH20   CPAP COMPLIANCE DATA:  Date Range: 08/26/2022-08/25/2023  Average Daily Use: 6 hours 26 minutes  Median Use: 6 hours 39 minutes  Compliance for > 4 Hours: 89%  AHI: 3.3 respiratory events per hour  Days Used: 362/365  days  Mask Leak: 39.9  95th Percentile Pressure: 16.7         LABS: Recent Results (from the past 2160 hours)  Bladder Scan (Post Void Residual) in office     Status: None   Collection Time: 06/03/23 10:41 AM  Result Value Ref Range   Scan Result 24 ml   Urinalysis, Complete     Status: Abnormal   Collection Time: 07/01/23  8:49 AM  Result Value Ref Range   Specific Gravity, UA 1.015 1.005 - 1.030   pH, UA 6.0 5.0 - 7.5   Color, UA Yellow Yellow   Appearance Ur Clear Clear   Leukocytes,UA 2+ (A) Negative   Protein,UA Negative Negative/Trace   Glucose, UA Negative Negative   Ketones, UA Negative Negative   RBC, UA Trace (A) Negative   Bilirubin, UA Negative Negative   Urobilinogen, Ur 0.2 0.2 - 1.0 mg/dL   Nitrite, UA Negative Negative   Microscopic Examination See below:     Comment: Microscopic was indicated and was performed.  Microscopic Examination     Status: Abnormal   Collection Time: 07/01/23  8:49 AM   Urine  Result Value Ref Range   WBC, UA >30 (A) 0 - 5 /hpf   RBC, Urine 0-2 0 - 2 /hpf   Epithelial Cells (non renal) 0-10 0 - 10 /hpf   Bacteria, UA Moderate (A) None seen/Few  CULTURE, URINE COMPREHENSIVE     Status: Abnormal   Collection Time: 07/01/23  9:26 AM   Specimen: Urine   UR  Result Value Ref Range   Urine Culture, Comprehensive Final report (A)    Organism ID, Bacteria Comment (A)     Comment: Beta hemolytic Streptococcus, group B Penicillin and ampicillin are drugs of choice for treatment of beta-hemolytic streptococcal infections. Susceptibility testing of penicillins and other beta-lactam agents approved by the FDA for treatment of beta-hemolytic streptococcal infections need not be performed routinely because nonsusceptible isolates are extremely rare in any beta-hemolytic streptococcus and have not been reported for Streptococcus pyogenes (group A). (  CLSI) 50,000-100,000 colony forming units per mL   BLADDER SCAN AMB NON-IMAGING      Status: None   Collection Time: 08/12/23  9:19 AM  Result Value Ref Range   Scan Result 76 ml     Radiology: No results found.  No results found.  No results found.    Assessment and Plan: Patient Active Problem List   Diagnosis Date Noted   Depression, recurrent (HCC) 06/07/2023   Hx of colonic polyps 07/07/2022   Adenomatous polyp of colon 07/07/2022   Caregiver stress 08/19/2021   Decreased ROM of right shoulder 12/30/2020   Recurrent UTI 12/30/2020   Frequency of urination and polyuria 12/02/2020   Right shoulder pain 12/02/2020   Need for influenza vaccination 11/20/2020   Bilateral shoulder pain 11/20/2020   OSA on CPAP 08/18/2020   CPAP use counseling 08/18/2020   Ear pain 01/10/2020   Obesity (BMI 30-39.9) 01/08/2020   Headache 01/08/2020   Prediabetes 10/12/2019   Cyst of soft tissue 10/11/2019   Urinary frequency 10/11/2019   Small vessel disease (HCC) 10/25/2017   Chest pain 08/24/2017   Pernicious anemia 06/10/2016   Advanced care planning/counseling discussion 06/10/2016   Screening for prostate cancer 06/10/2015   Nephrolithiasis 06/10/2015   Erectile dysfunction 06/10/2015   BPH with obstruction/lower urinary tract symptoms 05/15/2015   Benign essential HTN 10/30/2014   Hypothyroidism 10/30/2014   B12 deficiency 06/13/2014   Memory loss 06/13/2014   1. OSA on CPAP (Primary) The patient does tolerate PAP and reports  benefit from PAP use.Machine is at end of life and must be replaced.  The patient was reminded how to clean equipment and advised to replace supplies routinely. The patient was also counselled on weight loss. The compliance is very good. The AHI is 3.3.   OSA on cpap- controlled. Replace machine. Continue with very good  compliance with pap. CPAP continues to be medically necessary to treat this patient's OSA. F/u after setup   2. CPAP use counseling CPAP Counseling: had a lengthy discussion with the patient regarding the importance  of PAP therapy in management of the sleep apnea. Patient appears to understand the risk factor reduction and also understands the risks associated with untreated sleep apnea. Patient will try to make a good faith effort to remain compliant with therapy. Also instructed the patient on proper cleaning of the device including the water  must be changed daily if possible and use of distilled water  is preferred. Patient understands that the machine should be regularly cleaned with appropriate recommended cleaning solutions that do not damage the PAP machine for example given white vinegar and water  rinses. Other methods such as ozone treatment may not be as good as these simple methods to achieve cleaning.   3. Hypertension, unspecified type Slightly elevated today. Advised to monitor. Continue with losartan .      General Counseling: I have discussed the findings of the evaluation and examination with Joshua [name].  I have also discussed any further diagnostic evaluation thatmay be needed or ordered today. Joshua [name] verbalizes understanding of the findings of todays visit. We also reviewed his medications today and discussed drug interactions and side effects including but not limited excessive drowsiness and altered mental states. We also discussed that there is always a risk not just to him but also people around him. he has been encouraged to call the office with any questions or concerns that should arise related to todays visit.  No orders of the defined types were placed in this  encounter.       I have personally obtained a history, examined the patient, evaluated laboratory and imaging results, formulated the assessment and plan and placed orders. This patient was seen today by Lauraine Lay, PA-C in collaboration with Dr. Elfreda Bathe.   Elfreda DELENA Bathe, MD [facility] Diplomate ABMS Pulmonary Critical Care Medicine and Sleep Medicine

## 2023-08-29 ENCOUNTER — Other Ambulatory Visit: Payer: Self-pay

## 2023-08-29 ENCOUNTER — Ambulatory Visit (INDEPENDENT_AMBULATORY_CARE_PROVIDER_SITE_OTHER): Admitting: Internal Medicine

## 2023-08-29 VITALS — BP 143/106 | HR 81 | Resp 16 | Ht 65.5 in | Wt 209.0 lb

## 2023-08-29 DIAGNOSIS — Z7189 Other specified counseling: Secondary | ICD-10-CM

## 2023-08-29 DIAGNOSIS — G4733 Obstructive sleep apnea (adult) (pediatric): Secondary | ICD-10-CM | POA: Diagnosis not present

## 2023-08-29 DIAGNOSIS — I10 Essential (primary) hypertension: Secondary | ICD-10-CM | POA: Diagnosis not present

## 2023-08-29 NOTE — Patient Instructions (Signed)

## 2023-08-30 MED ORDER — LEVOTHYROXINE SODIUM 125 MCG PO TABS: 125.0000 ug | ORAL_TABLET | Freq: Every day | ORAL | 1 refills | Status: DC

## 2023-09-12 DIAGNOSIS — M9905 Segmental and somatic dysfunction of pelvic region: Secondary | ICD-10-CM | POA: Diagnosis not present

## 2023-09-12 DIAGNOSIS — M5136 Other intervertebral disc degeneration, lumbar region with discogenic back pain only: Secondary | ICD-10-CM | POA: Diagnosis not present

## 2023-09-12 DIAGNOSIS — M5432 Sciatica, left side: Secondary | ICD-10-CM | POA: Diagnosis not present

## 2023-09-12 DIAGNOSIS — M9903 Segmental and somatic dysfunction of lumbar region: Secondary | ICD-10-CM | POA: Diagnosis not present

## 2023-09-13 DIAGNOSIS — C44519 Basal cell carcinoma of skin of other part of trunk: Secondary | ICD-10-CM | POA: Diagnosis not present

## 2023-09-13 DIAGNOSIS — D225 Melanocytic nevi of trunk: Secondary | ICD-10-CM | POA: Diagnosis not present

## 2023-09-13 DIAGNOSIS — D2262 Melanocytic nevi of left upper limb, including shoulder: Secondary | ICD-10-CM | POA: Diagnosis not present

## 2023-09-13 DIAGNOSIS — D2261 Melanocytic nevi of right upper limb, including shoulder: Secondary | ICD-10-CM | POA: Diagnosis not present

## 2023-09-13 DIAGNOSIS — L218 Other seborrheic dermatitis: Secondary | ICD-10-CM | POA: Diagnosis not present

## 2023-09-13 DIAGNOSIS — L570 Actinic keratosis: Secondary | ICD-10-CM | POA: Diagnosis not present

## 2023-09-13 DIAGNOSIS — R208 Other disturbances of skin sensation: Secondary | ICD-10-CM | POA: Diagnosis not present

## 2023-09-13 DIAGNOSIS — D2272 Melanocytic nevi of left lower limb, including hip: Secondary | ICD-10-CM | POA: Diagnosis not present

## 2023-09-13 DIAGNOSIS — D485 Neoplasm of uncertain behavior of skin: Secondary | ICD-10-CM | POA: Diagnosis not present

## 2023-09-13 DIAGNOSIS — L820 Inflamed seborrheic keratosis: Secondary | ICD-10-CM | POA: Diagnosis not present

## 2023-09-19 DIAGNOSIS — H35033 Hypertensive retinopathy, bilateral: Secondary | ICD-10-CM | POA: Diagnosis not present

## 2023-09-19 DIAGNOSIS — Z961 Presence of intraocular lens: Secondary | ICD-10-CM | POA: Diagnosis not present

## 2023-09-19 DIAGNOSIS — H353221 Exudative age-related macular degeneration, left eye, with active choroidal neovascularization: Secondary | ICD-10-CM | POA: Diagnosis not present

## 2023-09-19 DIAGNOSIS — H35432 Paving stone degeneration of retina, left eye: Secondary | ICD-10-CM | POA: Diagnosis not present

## 2023-09-20 DIAGNOSIS — L570 Actinic keratosis: Secondary | ICD-10-CM | POA: Diagnosis not present

## 2023-10-05 ENCOUNTER — Other Ambulatory Visit: Payer: Self-pay | Admitting: Nurse Practitioner

## 2023-10-05 DIAGNOSIS — I10 Essential (primary) hypertension: Secondary | ICD-10-CM

## 2023-10-06 NOTE — Telephone Encounter (Signed)
 Requested Prescriptions  Pending Prescriptions Disp Refills   losartan  (COZAAR ) 100 MG tablet [Pharmacy Med Name: LOSARTAN  POTASSIUM 100 MG Oral Tablet] 90 tablet 0    Sig: TAKE 1 TABLET EVERY DAY     Cardiovascular:  Angiotensin Receptor Blockers Failed - 10/06/2023  9:03 AM      Failed - Last BP in normal range    BP Readings from Last 1 Encounters:  08/29/23 (!) 143/106         Passed - Cr in normal range and within 180 days    Creatinine, Ser  Date Value Ref Range Status  04/21/2023 0.90 0.76 - 1.27 mg/dL Final         Passed - K in normal range and within 180 days    Potassium  Date Value Ref Range Status  04/21/2023 3.9 3.5 - 5.2 mmol/L Final         Passed - Patient is not pregnant      Passed - Valid encounter within last 6 months    Recent Outpatient Visits           2 months ago Caregiver stress   [place] [facility] Melvin Pao, NP   4 months ago Depression, recurrent [facility])   [place] [facility] Melvin Pao, NP   5 months ago OAB (overactive bladder)   [place] [facility] Melvin Pao, NP   5 months ago Benign essential HTN   [place] [facility] Melvin Pao, NP   6 months ago Dysuria    [facility] Melvin Pao, NP       Future Appointments             In 10 months McGowan, Clotilda DELENA RIGGERS [facility] [place]

## 2023-10-20 ENCOUNTER — Ambulatory Visit (INDEPENDENT_AMBULATORY_CARE_PROVIDER_SITE_OTHER): Admitting: Nurse Practitioner

## 2023-10-20 ENCOUNTER — Encounter: Payer: Self-pay | Admitting: Nurse Practitioner

## 2023-10-20 VITALS — BP 116/72 | HR 71 | Temp 98.7°F | Ht 65.5 in | Wt 212.2 lb

## 2023-10-20 DIAGNOSIS — R7303 Prediabetes: Secondary | ICD-10-CM | POA: Diagnosis not present

## 2023-10-20 DIAGNOSIS — D510 Vitamin B12 deficiency anemia due to intrinsic factor deficiency: Secondary | ICD-10-CM | POA: Diagnosis not present

## 2023-10-20 DIAGNOSIS — E7800 Pure hypercholesterolemia, unspecified: Secondary | ICD-10-CM

## 2023-10-20 DIAGNOSIS — I10 Essential (primary) hypertension: Secondary | ICD-10-CM | POA: Diagnosis not present

## 2023-10-20 DIAGNOSIS — F339 Major depressive disorder, recurrent, unspecified: Secondary | ICD-10-CM

## 2023-10-20 DIAGNOSIS — E039 Hypothyroidism, unspecified: Secondary | ICD-10-CM

## 2023-10-20 DIAGNOSIS — Z23 Encounter for immunization: Secondary | ICD-10-CM

## 2023-10-20 MED ORDER — LEVOTHYROXINE SODIUM 125 MCG PO TABS: 125.0000 ug | ORAL_TABLET | Freq: Every day | ORAL | 1 refills | Status: AC

## 2023-10-20 MED ORDER — FLUOXETINE HCL 20 MG PO CAPS: 20.0000 mg | ORAL_CAPSULE | Freq: Every day | ORAL | 1 refills | Status: AC

## 2023-10-20 NOTE — Assessment & Plan Note (Signed)
 Chronic.  Controlled.  Continue with current medication regimen of Fluoxetine 20 mg.  Labs ordered today.  Return to clinic in 6 months for reevaluation.  Call sooner if concerns arise.

## 2023-10-20 NOTE — Assessment & Plan Note (Signed)
Chronic.  Controlled.  Continue with current medication regimen of Levothyroxine.  Refills sent today.  Labs ordered today.  Return to clinic in 6 months for reevaluation.  Call sooner if concerns arise.   

## 2023-10-20 NOTE — Progress Notes (Unsigned)
 BP 116/72   Pulse 71   Temp 98.7 F (37.1 C) (Oral)   Ht 5' 5.5 (1.664 m)   Wt 212 lb 3.2 oz (96.3 kg)   SpO2 98%   BMI 34.77 kg/m    Subjective:    Patient ID: Joshua [name], male    DOB: 25-Apr-1950, 73 y.o.   MRN: [id number]  HPI: Joshua [name] is a 73 y.o. male  Chief Complaint  Patient presents with   Depression   Hyperlipidemia   Hypertension   Hypothyroidism   Prediabetes   HYPERTENSION / HYPERLIPIDEMIA Taking Losartan  100 MG. He has tried to cut back on his salt. Satisfied with current treatment? yes Duration of hypertension: years BP monitoring frequency: not checking BP range: None BP medication side effects: no Past BP meds: losartan  (cozaar ) Duration of hyperlipidemia: years Cholesterol medication side effects: no Cholesterol supplements: none Past cholesterol medications: none Medication compliance: excellent compliance Aspirin: no Recent stressors: no Recurrent headaches: no Visual changes: no Palpitations: no Dyspnea: no Chest pain: no Lower extremity edema: yes- off and on Dizzy/lightheaded: no  HYPOTHYROIDISM Taking synthroid  125 MCG daily  Thyroid  control status:controlled Satisfied with current treatment? yes Medication side effects: no Medication compliance: excellent compliance Etiology of hypothyroidism:  Recent dose adjustment:no Fatigue: No Cold intolerance: no Heat intolerance: no Weight gain: no Weight loss: no Constipation: no Diarrhea/loose stools: no Palpitations: no Lower extremity edema: yes Anxiety/depressed mood: no  ANEMIA Taking daily B12 vitamin.  Anemia status: controlled Etiology of anemia: pernicious anemia Duration of anemia treatment: years Compliance with treatment: excellent compliance Iron supplementation side effects: no Severity of anemia: mild Fatigue: no Decreased exercise tolerance: no  Dyspnea on exertion: no Palpitations: no Bleeding: no Pica: no  Patient states he feels like  the fluoxetine  is doing okay.  He feels like it is helping more than the citalopram  did. He does feel like he is crying less on this medication.  He is still caring for his wife who is very sick.   Relevant past medical, surgical, family and social history reviewed and updated as indicated. Interim medical history since our last visit reviewed. Allergies and medications reviewed and updated.  Review of Systems  Constitutional:  Negative for fatigue and unexpected weight change.  Eyes:  Negative for visual disturbance.  Respiratory:  Negative for chest tightness and shortness of breath.   Cardiovascular:  Positive for leg swelling. Negative for chest pain and palpitations.  Gastrointestinal:  Negative for constipation and diarrhea.  Endocrine: Negative for cold intolerance and heat intolerance.  Neurological:  Negative for dizziness, light-headedness and headaches.  Psychiatric/Behavioral:  Positive for dysphoric mood. Negative for suicidal ideas. The patient is not nervous/anxious.     Per HPI unless specifically indicated above     Objective:    BP 116/72   Pulse 71   Temp 98.7 F (37.1 C) (Oral)   Ht 5' 5.5 (1.664 m)   Wt 212 lb 3.2 oz (96.3 kg)   SpO2 98%   BMI 34.77 kg/m   Wt Readings from Last 3 Encounters:  10/20/23 212 lb 3.2 oz (96.3 kg)  08/29/23 209 lb (94.8 kg)  07/19/23 209 lb (94.8 kg)    Physical Exam Vitals and nursing note reviewed.  Constitutional:      General: He is not in acute distress.    Appearance: Normal appearance. He is obese. He is not ill-appearing, toxic-appearing or diaphoretic.  HENT:     Head: Normocephalic.     Right  Ear: External ear normal.     Left Ear: External ear normal.     Nose: Nose normal. No congestion or rhinorrhea.     Mouth/Throat:     Mouth: Mucous membranes are moist.  Eyes:     General:        Right eye: No discharge.        Left eye: No discharge.     Extraocular Movements: Extraocular movements intact.      Conjunctiva/sclera: Conjunctivae normal.     Pupils: Pupils are equal, round, and reactive to light.  Cardiovascular:     Rate and Rhythm: Normal rate and regular rhythm.     Pulses:          Radial pulses are 2+ on the right side and 2+ on the left side.       Dorsalis pedis pulses are 2+ on the right side and 2+ on the left side.     Heart sounds: No murmur heard. Pulmonary:     Effort: Pulmonary effort is normal. No respiratory distress.     Breath sounds: Normal breath sounds. No wheezing, rhonchi or rales.  Abdominal:     General: Abdomen is flat. Bowel sounds are normal.  Musculoskeletal:     Cervical back: Normal range of motion and neck supple.     Right lower leg: No edema.     Left lower leg: No edema.  Skin:    General: Skin is warm and dry.     Capillary Refill: Capillary refill takes less than 2 seconds.  Neurological:     General: No focal deficit present.     Mental Status: He is alert and oriented to person, place, and time.  Psychiatric:        Mood and Affect: Mood normal. Affect is tearful.        Behavior: Behavior normal.        Thought Content: Thought content normal.        Judgment: Judgment normal.     Results for orders placed or performed in visit on 08/12/23  BLADDER SCAN AMB NON-IMAGING   Collection Time: 08/12/23  9:19 AM  Result Value Ref Range   Scan Result 76 ml       Assessment & Plan:   Problem List Items Addressed This Visit       Cardiovascular and Mediastinum   Benign essential HTN   Controlled.  Compliant with med regimen of losartan  100 MG. Refills sent today.  Labs ordered. Follow up in 6 months.  Call sooner if concerns arise.       Relevant Orders   Comprehensive metabolic panel with GFR   CBC With Diff/Platelet     Endocrine   Hypothyroidism   Chronic.  Controlled.  Continue with current medication regimen of Levothyroxine .  Refills sent today.  Labs ordered today.  Return to clinic in 6 months for reevaluation.  Call  sooner if concerns arise.       Relevant Medications   levothyroxine  (SYNTHROID ) 125 MCG tablet   Other Relevant Orders   CBC With Diff/Platelet   T4   TSH     Other   Pernicious anemia   Relevant Orders   B12   Prediabetes - Primary   Controlled.  Last A1c was 5.8%.  Labs ordered at visit today.  Will make recommendations based on lab results.        Relevant Orders   Hemoglobin A1c   CBC With Diff/Platelet  Need for influenza vaccination   Relevant Orders   Flu vaccine HIGH DOSE PF(Fluzone Trivalent)   Depression, recurrent (HCC)   Chronic.  Controlled.  Continue with current medication regimen of Fluoxetine  20mg .  Labs ordered today.  Return to clinic in 6 months for reevaluation.  Call sooner if concerns arise.        Relevant Medications   FLUoxetine  (PROZAC ) 20 MG capsule   Other Relevant Orders   CBC With Diff/Platelet   Other Visit Diagnoses       Elevated LDL cholesterol level       Relevant Orders   Lipid panel   CBC With Diff/Platelet         Follow up plan: Return in about 6 months (around 04/18/2024) for HTN, HLD, DM2 FU.

## 2023-10-20 NOTE — Assessment & Plan Note (Signed)
 Controlled.  Compliant with med regimen of losartan  100 MG. Refills sent today.  Labs ordered. Follow up in 6 months.  Call sooner if concerns arise.

## 2023-10-20 NOTE — Assessment & Plan Note (Signed)
 Controlled.  Last A1c was 5.8%.  Labs ordered at visit today.  Will make recommendations based on lab results.

## 2023-10-21 ENCOUNTER — Ambulatory Visit: Payer: Self-pay | Admitting: Nurse Practitioner

## 2023-10-21 LAB — CBC WITH DIFF/PLATELET
Basophils Absolute: 0.1 x10E3/uL (ref 0.0–0.2)
Basos: 1 %
EOS (ABSOLUTE): 0.3 x10E3/uL (ref 0.0–0.4)
Eos: 3 %
Hematocrit: 40.8 % (ref 37.5–51.0)
Hemoglobin: 13.7 g/dL (ref 13.0–17.7)
Immature Grans (Abs): 0 x10E3/uL (ref 0.0–0.1)
Immature Granulocytes: 0 %
Lymphocytes Absolute: 1.8 x10E3/uL (ref 0.7–3.1)
Lymphs: 21 %
MCH: 32.8 pg (ref 26.6–33.0)
MCHC: 33.6 g/dL (ref 31.5–35.7)
MCV: 98 fL — ABNORMAL HIGH (ref 79–97)
Monocytes Absolute: 0.7 x10E3/uL (ref 0.1–0.9)
Monocytes: 9 %
Neutrophils Absolute: 5.5 x10E3/uL (ref 1.4–7.0)
Neutrophils: 66 %
Platelets: 161 x10E3/uL (ref 150–450)
RBC: 4.18 x10E6/uL (ref 4.14–5.80)
RDW: 12.7 % (ref 11.6–15.4)
WBC: 8.4 x10E3/uL (ref 3.4–10.8)

## 2023-10-21 LAB — COMPREHENSIVE METABOLIC PANEL WITH GFR
ALT: 10 IU/L (ref 0–44)
AST: 17 IU/L (ref 0–40)
Albumin: 4.3 g/dL (ref 3.8–4.8)
Alkaline Phosphatase: 95 IU/L (ref 47–123)
BUN/Creatinine Ratio: 15 (ref 10–24)
BUN: 14 mg/dL (ref 8–27)
Bilirubin Total: 0.6 mg/dL (ref 0.0–1.2)
CO2: 24 mmol/L (ref 20–29)
Calcium: 9.5 mg/dL (ref 8.6–10.2)
Chloride: 100 mmol/L (ref 96–106)
Creatinine, Ser: 0.91 mg/dL (ref 0.76–1.27)
Globulin, Total: 2.3 g/dL (ref 1.5–4.5)
Glucose: 107 mg/dL — ABNORMAL HIGH (ref 70–99)
Potassium: 4.2 mmol/L (ref 3.5–5.2)
Sodium: 138 mmol/L (ref 134–144)
Total Protein: 6.6 g/dL (ref 6.0–8.5)
eGFR: 88 mL/min/1.73 (ref >59)

## 2023-10-21 LAB — LIPID PANEL
Chol/HDL Ratio: 5.5 ratio — ABNORMAL HIGH (ref 0.0–5.0)
Cholesterol, Total: 209 mg/dL — ABNORMAL HIGH (ref 100–199)
HDL: 38 mg/dL — ABNORMAL LOW (ref >39)
LDL Chol Calc (NIH): 101 mg/dL — ABNORMAL HIGH (ref 0–99)
Triglycerides: 412 mg/dL — ABNORMAL HIGH (ref 0–149)
VLDL Cholesterol Cal: 70 mg/dL — ABNORMAL HIGH (ref 5–40)

## 2023-10-21 LAB — T4: T4, Total: 7.8 ug/dL (ref 4.5–12.0)

## 2023-10-21 LAB — HEMOGLOBIN A1C
Est. average glucose Bld gHb Est-mCnc: 117 mg/dL
Hgb A1c MFr Bld: 5.7 % — ABNORMAL HIGH (ref 4.8–5.6)

## 2023-10-21 LAB — VITAMIN B12: Vitamin B-12: 1289 pg/mL — ABNORMAL HIGH (ref 232–1245)

## 2023-10-21 LAB — TSH: TSH: 0.839 u[IU]/mL (ref 0.450–4.500)

## 2023-11-03 ENCOUNTER — Ambulatory Visit (INDEPENDENT_AMBULATORY_CARE_PROVIDER_SITE_OTHER)

## 2023-11-03 DIAGNOSIS — Z23 Encounter for immunization: Secondary | ICD-10-CM | POA: Diagnosis not present

## 2023-11-21 DIAGNOSIS — C44519 Basal cell carcinoma of skin of other part of trunk: Secondary | ICD-10-CM | POA: Diagnosis not present

## 2023-11-21 DIAGNOSIS — R208 Other disturbances of skin sensation: Secondary | ICD-10-CM | POA: Diagnosis not present

## 2023-11-21 DIAGNOSIS — L820 Inflamed seborrheic keratosis: Secondary | ICD-10-CM | POA: Diagnosis not present

## 2023-11-21 DIAGNOSIS — L905 Scar conditions and fibrosis of skin: Secondary | ICD-10-CM | POA: Diagnosis not present

## 2023-12-17 NOTE — Progress Notes (Unsigned)
 [facility] [address] [place], [place] [address]  Pulmonary Sleep Medicine   Office Visit Note  Patient Name: Joshua [name] DOB: January 14, 1951 MRN [id number]    Chief Complaint: Obstructive Sleep Apnea visit  Brief History:  Joshua [name] is seen today for a follow up after set up on replacement APAP @ 9-17 cmH2O. The patient has a 10 year history of sleep apnea. Patient is using PAP nightly.  The patient feels rested after sleeping with PAP.  The patient reports benefiting from PAP use. Reported sleepiness is  improved and the Epworth Sleepiness Score is 5 out of 24. The patient does take naps. The patient complains of the following: none.  The compliance download shows 86% compliance with an average use time of 6 hours 24 minutes. The AHI is 3.9  The patient does complain of limb movements disrupting sleep.  ROS  General: (-) fever, (-) chills, (-) night sweat Nose and Sinuses: (-) nasal stuffiness or itchiness, (-) postnasal drip, (-) nosebleeds, (-) sinus trouble. Mouth and Throat: (-) sore throat, (-) hoarseness. Neck: (-) swollen glands, (-) enlarged thyroid , (-) neck pain. Respiratory: + cough, - shortness of breath, - wheezing. Neurologic: + numbness, +tingling seeing neurology soon to evaluate Psychiatric: - anxiety, - depression   Current Medication: Outpatient Encounter Medications as of 12/19/2023  Medication Sig Note   acetaminophen (TYLENOL) 500 MG tablet Take 500 mg by mouth every 6 (six) hours as needed.    cyanocobalamin  (VITAMIN B12) 1000 MCG tablet Take 1,000 mcg by mouth daily.    FLUoxetine  (PROZAC ) 20 MG capsule Take 1 capsule (20 mg total) by mouth daily.    hydrocortisone 2.5 % lotion Apply topically. 01/04/2023: prn   ketoconazole (NIZORAL) 2 % shampoo Apply 1 Application topically 2 (two) times a week.    levothyroxine  (SYNTHROID ) 125 MCG tablet Take 1 tablet (125 mcg total) by mouth daily.    losartan  (COZAAR ) 100 MG tablet TAKE 1 TABLET EVERY  DAY    Multiple Vitamins-Minerals (EYE VITAMINS PO) Take by mouth.    Multiple Vitamins-Minerals (ICAPS AREDS 2 PO) Take by mouth.    solifenacin  (VESICARE ) 5 MG tablet Take 1 tablet (5 mg total) by mouth daily.    No facility-administered encounter medications on file as of 12/19/2023.    Surgical History: Past Surgical History:  Procedure Laterality Date   COLONOSCOPY  March 2016   COLONOSCOPY WITH PROPOFOL  N/A 05/29/2020   Procedure: COLONOSCOPY WITH PROPOFOL ;  Surgeon: Therisa Bi, MD;  Location: [facility] ENDOSCOPY;  Service: Gastroenterology;  Laterality: N/A;   COLONOSCOPY WITH PROPOFOL  N/A 01/06/2021   Procedure: COLONOSCOPY WITH PROPOFOL ;  Surgeon: Therisa Bi, MD;  Location: [facility] ENDOSCOPY;  Service: Gastroenterology;  Laterality: N/A;   COLONOSCOPY WITH PROPOFOL  N/A 07/07/2022   Procedure: COLONOSCOPY WITH PROPOFOL ;  Surgeon: Therisa Bi, MD;  Location: [facility] ENDOSCOPY;  Service: Gastroenterology;  Laterality: N/A;   ESOPHAGOGASTRODUODENOSCOPY ENDOSCOPY  March 2016   EYE SURGERY Bilateral    cataracts   KNEE SURGERY Right    SHOULDER SURGERY Left    TONSILLECTOMY      Medical History: Past Medical History:  Diagnosis Date   Allergy    Amnesia 06/13/2014   B12 deficiency 06/13/2014   Benign essential HTN 10/30/2014   BPH (benign prostatic hyperplasia)    BPH with obstruction/lower urinary tract symptoms 05/15/2015   History of kidney stones    Hypogonadism in male    Hypothyroidism 10/30/2014    Family History: Non contributory to the present illness  Social History: Social History   Socioeconomic History   Marital status: Married    Spouse name: Rock   Number of children: 2   Years of education: Not on file   Highest education level: High school graduate  Occupational History   Occupation: retired  Tobacco Use   Smoking status: Never   Smokeless tobacco: Never  Vaping Use   Vaping status: Never Used  Substance and Sexual Activity   Alcohol use: Yes     Comment: swig of wine monthly or less   Drug use: No   Sexual activity: Not Currently  Other Topics Concern   Not on file  Social History Narrative   Not on file   Social Drivers of Health   Financial Resource Strain: Low Risk  (01/04/2023)   Overall Financial Resource Strain (CARDIA)    Difficulty of Paying Living Expenses: Not hard at all  Food Insecurity: No Food Insecurity (01/04/2023)   Hunger Vital Sign    Worried About Running Out of Food in the Last Year: Never true    Ran Out of Food in the Last Year: Never true  Transportation Needs: No Transportation Needs (01/04/2023)   PRAPARE - Administrator, Civil Service (Medical): No    Lack of Transportation (Non-Medical): No  Physical Activity: Inactive (01/04/2023)   Exercise Vital Sign    Days of Exercise per Week: 0 days    Minutes of Exercise per Session: 0 min  Stress: No Stress Concern Present (01/04/2023)   [employer] of Occupational Health - Occupational Stress Questionnaire    Feeling of Stress : Only a little  Social Connections: Moderately Isolated (01/04/2023)   Social Connection and Isolation Panel    Frequency of Communication with Friends and Family: More than three times a week    Frequency of Social Gatherings with Friends and Family: Three times a week    Attends Religious Services: Never    Active Member of Clubs or Organizations: No    Attends Banker Meetings: Never    Marital Status: Married  Catering Manager Violence: Not At Risk (01/04/2023)   Humiliation, Afraid, Rape, and Kick questionnaire    Fear of Current or Ex-Partner: No    Emotionally Abused: No    Physically Abused: No    Sexually Abused: No    Vital Signs: Blood pressure (!) 127/94, pulse 74, resp. rate 16, height 5' 7 (1.702 m), weight 214 lb (97.1 kg), SpO2 98%. Body mass index is 33.52 kg/m.    Examination: General Appearance: The patient is well-developed, well-nourished, and in no distress. Neck  Circumference: 47 cm Skin: Gross inspection of skin unremarkable. Head: normocephalic, no gross deformities. Eyes: no gross deformities noted. ENT: ears appear grossly normal Neurologic: Alert and oriented. No involuntary movements.  STOP BANG RISK ASSESSMENT S (snore) Have you been told that you snore?     NO   T (tired) Are you often tired, fatigued, or sleepy during the day?   NO  O (obstruction) Do you stop breathing, choke, or gasp during sleep? NO   P (pressure) Do you have or are you being treated for high blood pressure? YES   B (BMI) Is your body index greater than 35 kg/m? NO   A (age) Are you 92 years old or older? YES   N (neck) Do you have a neck circumference greater than 16 inches?   YES   G (gender) Are you a male? YES   TOTAL STOP/BANG "  YES" ANSWERS 4       A STOP-Bang score of 2 or less is considered low risk, and a score of 5 or more is high risk for having either moderate or severe OSA. For people who score 3 or 4, doctors may need to perform further assessment to determine how likely they are to have OSA.         EPWORTH SLEEPINESS SCALE:  Scale:  (0)= no chance of dozing; (1)= slight chance of dozing; (2)= moderate chance of dozing; (3)= high chance of dozing  Chance  Situtation    Sitting and reading: 0    Watching TV: 1    Sitting Inactive in public: 1    As a passenger in car: 0      Lying down to rest: 3    Sitting and talking: 0    Sitting quielty after lunch: 0    In a car, stopped in traffic: 0   TOTAL SCORE:   5 out of 24    SLEEP STUDIES:  PSG (06/14/2013) AHI 22/hr, Supine AHI 59/hr, min Sp02 88% Titration (06/28/2013) CPAP @ 15 cmH2O   CPAP COMPLIANCE DATA:  Date Range: 10/13/2023 - 12/15/2023   Average Daily Use: 6 hours 30 minutes  Median Use: 6 hours 39 minutes  Compliance for > 4 Hours: 86% days  AHI: 3.9 respiratory events per hour  Days Used: 63/64  Mask Leak: 44.0  95th Percentile Pressure: 15  cmH2O         LABS: Recent Results (from the past 2160 hours)  Comprehensive metabolic panel with GFR     Status: Abnormal   Collection Time: 10/20/23  3:01 PM  Result Value Ref Range   Glucose 107 (H) 70 - 99 mg/dL   BUN 14 8 - 27 mg/dL   Creatinine, Ser 9.08 0.76 - 1.27 mg/dL   eGFR 88 >40 fO/fpw/8.26   BUN/Creatinine Ratio 15 10 - 24   Sodium 138 134 - 144 mmol/L   Potassium 4.2 3.5 - 5.2 mmol/L   Chloride 100 96 - 106 mmol/L   CO2 24 20 - 29 mmol/L   Calcium  9.5 8.6 - 10.2 mg/dL   Total Protein 6.6 6.0 - 8.5 g/dL   Albumin 4.3 3.8 - 4.8 g/dL   Globulin, Total 2.3 1.5 - 4.5 g/dL   Bilirubin Total 0.6 0.0 - 1.2 mg/dL   Alkaline Phosphatase 95 47 - 123 IU/L    Comment:               **Please note reference interval change**   AST 17 0 - 40 IU/L   ALT 10 0 - 44 IU/L  Hemoglobin A1c     Status: Abnormal   Collection Time: 10/20/23  3:01 PM  Result Value Ref Range   Hgb A1c MFr Bld 5.7 (H) 4.8 - 5.6 %    Comment:          Prediabetes: 5.7 - 6.4          Diabetes: >6.4          Glycemic control for adults with diabetes: <7.0    Est. average glucose Bld gHb Est-mCnc 117 mg/dL  Lipid panel     Status: Abnormal   Collection Time: 10/20/23  3:01 PM  Result Value Ref Range   Cholesterol, Total 209 (H) 100 - 199 mg/dL   Triglycerides 587 (H) 0 - 149 mg/dL   HDL 38 (L) >60 mg/dL   VLDL Cholesterol Cal 70 (H)  5 - 40 mg/dL   LDL Chol Calc (NIH) 898 (H) 0 - 99 mg/dL   Chol/HDL Ratio 5.5 (H) 0.0 - 5.0 ratio    Comment:                                   T. Chol/HDL Ratio                                             Men  Women                               1/2 Avg.Risk  3.4    3.3                                   Avg.Risk  5.0    4.4                                2X Avg.Risk  9.6    7.1                                3X Avg.Risk 23.4   11.0   CBC With Diff/Platelet     Status: Abnormal   Collection Time: 10/20/23  3:01 PM  Result Value Ref Range   WBC 8.4 3.4 - 10.8  x10E3/uL   RBC 4.18 4.14 - 5.80 x10E6/uL   Hemoglobin 13.7 13.0 - 17.7 g/dL   Hematocrit 59.1 62.4 - 51.0 %   MCV 98 (H) 79 - 97 fL   MCH 32.8 26.6 - 33.0 pg   MCHC 33.6 31.5 - 35.7 g/dL   RDW 87.2 88.3 - 84.5 %   Platelets 161 150 - 450 x10E3/uL   Neutrophils 66 Not Estab. %   Lymphs 21 Not Estab. %   Monocytes 9 Not Estab. %   Eos 3 Not Estab. %   Basos 1 Not Estab. %   Neutrophils Absolute 5.5 1.4 - 7.0 x10E3/uL   Lymphocytes Absolute 1.8 0.7 - 3.1 x10E3/uL   Monocytes Absolute 0.7 0.1 - 0.9 x10E3/uL   EOS (ABSOLUTE) 0.3 0.0 - 0.4 x10E3/uL   Basophils Absolute 0.1 0.0 - 0.2 x10E3/uL   Immature Granulocytes 0 Not Estab. %   Immature Grans (Abs) 0.0 0.0 - 0.1 x10E3/uL  T4     Status: None   Collection Time: 10/20/23  3:01 PM  Result Value Ref Range   T4, Total 7.8 4.5 - 12.0 ug/dL  TSH     Status: None   Collection Time: 10/20/23  3:01 PM  Result Value Ref Range   TSH 0.839 0.450 - 4.500 uIU/mL  B12     Status: Abnormal   Collection Time: 10/20/23  3:01 PM  Result Value Ref Range   Vitamin B-12 1,289 (H) 232 - 1,245 pg/mL    Radiology: No results found.  No results found.  No results found.    Assessment and Plan: Patient Active Problem List   Diagnosis Date Noted   Depression, recurrent 06/07/2023   Hx of colonic polyps 07/07/2022   Adenomatous  polyp of colon 07/07/2022   Caregiver stress 08/19/2021   Decreased ROM of right shoulder 12/30/2020   Recurrent UTI 12/30/2020   Frequency of urination and polyuria 12/02/2020   Right shoulder pain 12/02/2020   Need for influenza vaccination 11/20/2020   Bilateral shoulder pain 11/20/2020   OSA on CPAP 08/18/2020   CPAP use counseling 08/18/2020   Ear pain 01/10/2020   Obesity (BMI 30-39.9) 01/08/2020   Headache 01/08/2020   Prediabetes 10/12/2019   Cyst of soft tissue 10/11/2019   Urinary frequency 10/11/2019   Small vessel disease 10/25/2017   Chest pain 08/24/2017   Pernicious anemia 06/10/2016    Advanced care planning/counseling discussion 06/10/2016   Screening for prostate cancer 06/10/2015   Nephrolithiasis 06/10/2015   Erectile dysfunction 06/10/2015   BPH with obstruction/lower urinary tract symptoms 05/15/2015   Benign essential HTN 10/30/2014   Hypothyroidism 10/30/2014   B12 deficiency 06/13/2014   Memory loss 06/13/2014   1. OSA on CPAP (Primary) The patient does tolerate PAP and reports  benefit from PAP use. The patient was reminded how to clean equipment and advised to replace supplies routinely. The patient was also counselled on weight loss. The compliance is very good. The AHI is 3,9.   OSA on cpap- controlled. Continue with excellent compliance with pap. CPAP continues to be medically necessary to treat this patient's OSA. F/u one year.     2. CPAP use counseling CPAP Counseling: had a lengthy discussion with the patient regarding the importance of PAP therapy in management of the sleep apnea. Patient appears to understand the risk factor reduction and also understands the risks associated with untreated sleep apnea. Patient will try to make a good faith effort to remain compliant with therapy. Also instructed the patient on proper cleaning of the device including the water  must be changed daily if possible and use of distilled water  is preferred. Patient understands that the machine should be regularly cleaned with appropriate recommended cleaning solutions that do not damage the PAP machine for example given white vinegar and water  rinses. Other methods such as ozone treatment may not be as good as these simple methods to achieve cleaning.   3. Hypertension, unspecified type Hypertension Counseling:   The following hypertensive lifestyle modification were recommended and discussed:  1. Limiting alcohol intake to less than 1 oz/day of ethanol:(24 oz of beer or 8 oz of wine or 2 oz of 100-proof whiskey). 2. Take baby ASA 81 mg daily. 3. Importance of regular aerobic  exercise and losing weight. 4. Reduce dietary saturated fat and cholesterol intake for overall cardiovascular health. 5. Maintaining adequate dietary potassium, calcium , and magnesium intake. 6. Regular monitoring of the blood pressure. 7. Reduce sodium intake to less than 100 mmol/day (less than 2.3 gm of sodium or less than 6 gm of sodium choride)         General Counseling: I have discussed the findings of the evaluation and examination with [place].  I have also discussed any further diagnostic evaluation thatmay be needed or ordered today. Ramari verbalizes understanding of the findings of todays visit. We also reviewed his medications today and discussed drug interactions and side effects including but not limited excessive drowsiness and altered mental states. We also discussed that there is always a risk not just to him but also people around him. he has been encouraged to call the office with any questions or concerns that should arise related to todays visit.  No orders of the defined types were placed  in this encounter.       I have personally obtained a history, examined the patient, evaluated laboratory and imaging results, formulated the assessment and plan and placed orders. This patient was seen today by Lauraine Lay, PA-C in collaboration with Dr. Elfreda Bathe.   Elfreda DELENA Bathe, MD [facility] Diplomate ABMS Pulmonary Critical Care Medicine and Sleep Medicine

## 2023-12-19 ENCOUNTER — Ambulatory Visit (INDEPENDENT_AMBULATORY_CARE_PROVIDER_SITE_OTHER): Admitting: Internal Medicine

## 2023-12-19 VITALS — BP 127/94 | HR 74 | Resp 16 | Ht 67.0 in | Wt 214.0 lb

## 2023-12-19 DIAGNOSIS — G4733 Obstructive sleep apnea (adult) (pediatric): Secondary | ICD-10-CM | POA: Diagnosis not present

## 2023-12-19 DIAGNOSIS — I10 Essential (primary) hypertension: Secondary | ICD-10-CM

## 2023-12-19 DIAGNOSIS — Z7189 Other specified counseling: Secondary | ICD-10-CM

## 2023-12-19 NOTE — Patient Instructions (Signed)

## 2023-12-23 ENCOUNTER — Other Ambulatory Visit: Payer: Self-pay | Admitting: Nurse Practitioner

## 2023-12-23 DIAGNOSIS — I10 Essential (primary) hypertension: Secondary | ICD-10-CM

## 2023-12-25 NOTE — Telephone Encounter (Signed)
 Requested Prescriptions  Pending Prescriptions Disp Refills   losartan  (COZAAR ) 100 MG tablet [Pharmacy Med Name: LOSARTAN  POTASSIUM 100 MG Oral Tablet] 90 tablet 0    Sig: TAKE 1 TABLET EVERY DAY     Cardiovascular:  Angiotensin Receptor Blockers Failed - 12/25/2023 11:28 AM      Failed - Last BP in normal range    BP Readings from Last 1 Encounters:  12/19/23 (!) 127/94         Passed - Cr in normal range and within 180 days    Creatinine, Ser  Date Value Ref Range Status  10/20/2023 0.91 0.76 - 1.27 mg/dL Final         Passed - K in normal range and within 180 days    Potassium  Date Value Ref Range Status  10/20/2023 4.2 3.5 - 5.2 mmol/L Final         Passed - Patient is not pregnant      Passed - Valid encounter within last 6 months    Recent Outpatient Visits           2 months ago Prediabetes   [place] [facility] Melvin Pao, NP   5 months ago Caregiver stress   [place] [facility] Melvin Pao, NP   6 months ago Depression, recurrent   [facility] Melvin Pao, NP   7 months ago OAB (overactive bladder)   [facility] Melvin Pao, NP   8 months ago Benign essential HTN   [place] [facility] Melvin Pao, NP       Future Appointments             In 7 months McGowan, Clotilda DELENA RIGGERS [facility]

## 2024-01-09 DIAGNOSIS — H353221 Exudative age-related macular degeneration, left eye, with active choroidal neovascularization: Secondary | ICD-10-CM | POA: Diagnosis not present

## 2024-01-10 ENCOUNTER — Ambulatory Visit: Payer: Self-pay | Admitting: Emergency Medicine

## 2024-01-10 VITALS — BP 126/74 | Ht 67.0 in | Wt 212.2 lb

## 2024-01-10 DIAGNOSIS — Z0000 Encounter for general adult medical examination without abnormal findings: Secondary | ICD-10-CM | POA: Diagnosis not present

## 2024-01-10 NOTE — Progress Notes (Signed)
I, as supervising provider, have reviewed the nurse health advisor's Medicare Wellness Visit note for this patient and concur with the findings and recommendations listed above.

## 2024-01-10 NOTE — Patient Instructions (Signed)
 [name],  Thank you for taking the time for your Medicare Wellness Visit. I appreciate your continued commitment to your health goals. Please review the care plan we discussed, and feel free to reach out if I can assist you further.  Please note that Annual Wellness Visits do not include a physical exam. Some assessments may be limited, especially if the visit was conducted virtually. If needed, we may recommend an in-person follow-up with your provider.  Ongoing Care Seeing your primary care provider every 3 to 6 months helps us  monitor your health and provide consistent, personalized care.    Referrals If a referral was made during today's visit and you haven't received any updates within two weeks, please contact the referred provider directly to check on the status.  Recommended Screenings:  Health Maintenance  Topic Date Due   Zoster (Shingles) Vaccine (1 of 2) 04/24/1998   Medicare Annual Wellness Visit  01/04/2024   COVID-19 Vaccine (7 - 2025-26 season) 05/03/2024   Colon Cancer Screening  07/06/2025   DTaP/Tdap/Td vaccine (3 - Tdap) 02/21/2028   Pneumococcal Vaccine for age over 32  Completed   Flu Shot  Completed   Hepatitis C Screening  Completed   Meningitis B Vaccine  Aged Out       01/10/2024   10:56 AM  Advanced Directives  Does Patient Have a Medical Advance Directive? No  Would patient like information on creating a medical advance directive? Yes (MAU/Ambulatory/Procedural Areas - Information given)    Vision: Annual vision screenings are recommended for early detection of glaucoma, cataracts, and diabetic retinopathy. These exams can also reveal signs of chronic conditions such as diabetes and high blood pressure.  Dental: Annual dental screenings help detect early signs of oral cancer, gum disease, and other conditions linked to overall health, including heart disease and diabetes.  Please see the attached documents for additional preventive care  recommendations.

## 2024-01-10 NOTE — Progress Notes (Signed)
 Chief Complaint  Patient presents with   Medicare Wellness     Subjective:   Joshua [name] is a 73 y.o. male who presents for a Medicare Annual Wellness Visit.  Visit info / Clinical Intake: Medicare Wellness Visit Type:: Subsequent Annual Wellness Visit Persons participating in visit and providing information:: patient Medicare Wellness Visit Mode:: In-person (required for WTM) Interpreter Needed?: No Pre-visit prep was completed: yes AWV questionnaire completed by patient prior to visit?: no Living arrangements:: lives with spouse/significant other Patient's Overall Health Status Rating: good Typical amount of pain: (!) a lot Does pain affect daily life?: (!) yes Are you currently prescribed opioids?: no  Dietary Habits and Nutritional Risks How many meals a day?: 2 Eats fruit and vegetables daily?: yes Most meals are obtained by: preparing own meals In the last 2 weeks, have you had any of the following?: none Diabetic:: no  Functional Status Activities of Daily Living (to include ambulation/medication): Independent Ambulation: Independent with device- listed below Home Assistive Devices/Equipment: Eyeglasses; Other (Comment) (hearing aids) Medication Administration: Independent Home Management (perform basic housework or laundry): Independent Manage your own finances?: yes Primary transportation is: driving Concerns about vision?: no *vision screening is required for WTM* Concerns about hearing?: no  Fall Screening Falls in the past year?: 0 Number of falls in past year: 0 Was there an injury with Fall?: 0 Fall Risk Category Calculator: 0 Patient Fall Risk Level: Low Fall Risk  Fall Risk Patient at Risk for Falls Due to: No Fall Risks Fall risk Follow up: Falls evaluation completed  Home and Transportation Safety: All rugs have non-skid backing?: N/A, no rugs All stairs or steps have railings?: yes (also has a ramp) Grab bars in the bathtub or shower?:  yes Have non-skid surface in bathtub or shower?: yes Good home lighting?: yes Regular seat belt use?: yes Hospital stays in the last year:: no  Cognitive Assessment Difficulty concentrating, remembering, or making decisions? : yes (memory loss) Will 6CIT or Mini Cog be Completed: yes What year is it?: 0 points What month is it?: 0 points Give patient an address phrase to remember (5 components): [address] [place] About what time is it?: 0 points Count backwards from 20 to 1: 0 points Say the months of the year in reverse: 0 points Repeat the address phrase from earlier: 0 points 6 CIT Score: 0 points  Advance Directives (For Healthcare) Does Patient Have a Medical Advance Directive?: No Would patient like information on creating a medical advance directive?: Yes (MAU/Ambulatory/Procedural Areas - Information given)  Reviewed/Updated  Reviewed/Updated: Reviewed All (Medical, Surgical, Family, Medications, Allergies, Care Teams, Patient Goals)    Allergies (verified) Aspirin, Celery oil, Flomax  [tamsulosin  hcl], Hydromorphone, Other, Oysters [shellfish allergy], Amlodipine  besylate, and Latex   Current Medications (verified) Outpatient Encounter Medications as of 01/10/2024  Medication Sig   acetaminophen (TYLENOL) 500 MG tablet Take 500 mg by mouth every 6 (six) hours as needed.   cyanocobalamin  (VITAMIN B12) 1000 MCG tablet Take 1,000 mcg by mouth daily.   FLUoxetine  (PROZAC ) 20 MG capsule Take 1 capsule (20 mg total) by mouth daily.   hydrocortisone 2.5 % lotion Apply topically.   levothyroxine  (SYNTHROID ) 125 MCG tablet Take 1 tablet (125 mcg total) by mouth daily.   losartan  (COZAAR ) 100 MG tablet TAKE 1 TABLET EVERY DAY   Multiple Vitamins-Minerals (ICAPS AREDS 2 PO) Take by mouth.   solifenacin  (VESICARE ) 5 MG tablet Take 1 tablet (5 mg total) by mouth daily.  ketoconazole (NIZORAL) 2 % shampoo Apply 1 Application topically 2 (two) times a week. (Patient not  taking: Reported on 01/10/2024)   Multiple Vitamins-Minerals (EYE VITAMINS PO) Take by mouth. (Patient not taking: Reported on 01/10/2024)   No facility-administered encounter medications on file as of 01/10/2024.    History: Past Medical History:  Diagnosis Date   Allergy    Amnesia 06/13/2014   B12 deficiency 06/13/2014   Benign essential HTN 10/30/2014   BPH (benign prostatic hyperplasia)    BPH with obstruction/lower urinary tract symptoms 05/15/2015   History of kidney stones    Hypogonadism in male    Hypothyroidism 10/30/2014   Past Surgical History:  Procedure Laterality Date   COLONOSCOPY  March 2016   COLONOSCOPY WITH PROPOFOL  N/A 05/29/2020   Procedure: COLONOSCOPY WITH PROPOFOL ;  Surgeon: Therisa Bi, MD;  Location: [facility] ENDOSCOPY;  Service: Gastroenterology;  Laterality: N/A;   COLONOSCOPY WITH PROPOFOL  N/A 01/06/2021   Procedure: COLONOSCOPY WITH PROPOFOL ;  Surgeon: Therisa Bi, MD;  Location[facility] ENDOSCOPY;  Service: Gastroenterology;  Laterality: N/A;   COLONOSCOPY WITH PROPOFOL  N/A 07/07/2022   Procedure: COLONOSCOPY WITH PROPOFOL ;  Surgeon: Therisa Bi, MD;  Location: [facility] ENDOSCOPY;  Service: Gastroenterology;  Laterality: N/A;   ESOPHAGOGASTRODUODENOSCOPY ENDOSCOPY  March 2016   EYE SURGERY Bilateral    cataracts   KNEE SURGERY Right    SHOULDER SURGERY Left    TONSILLECTOMY     Family History  Problem Relation Age of Onset   Breast cancer Mother    COPD Father    Heart disease Father    Prostate cancer Father    Social History   Occupational History   Occupation: retired  Tobacco Use   Smoking status: Never   Smokeless tobacco: Never  Vaping Use   Vaping status: Never Used  Substance and Sexual Activity   Alcohol use: Not Currently    Comment: swig of wine monthly or less   Drug use: No   Sexual activity: Not Currently   Tobacco Counseling Counseling given: Not Answered  SDOH Screenings   Food Insecurity: No Food Insecurity (01/10/2024)   Housing: Low Risk  (01/10/2024)  Transportation Needs: No Transportation Needs (01/10/2024)  Utilities: Not At Risk (01/10/2024)  Alcohol Screen: Low Risk  (01/04/2023)  Depression (PHQ2-9): Low Risk  (01/10/2024)  Financial Resource Strain: Low Risk  (01/04/2023)  Physical Activity: Inactive (01/10/2024)  Social Connections: Moderately Isolated (01/10/2024)  Stress: No Stress Concern Present (01/10/2024)  Tobacco Use: Low Risk  (01/10/2024)  Health Literacy: Adequate Health Literacy (01/10/2024)   See flowsheets for full screening details  Depression Screen PHQ 2 & 9 Depression Scale- Over the past 2 weeks, how often have you been bothered by any of the following problems? Little interest or pleasure in doing things: 0 Feeling down, depressed, or hopeless (PHQ Adolescent also includes...irritable): 0 PHQ-2 Total Score: 0 Trouble falling or staying asleep, or sleeping too much: 3 (caregiver for wife with Parkinson's and gets woken up 2-3 times per night) Feeling tired or having little energy: 1 Poor appetite or overeating (PHQ Adolescent also includes...weight loss): 0 Feeling bad about yourself - or that you are a failure or have let yourself or your family down: 0 Trouble concentrating on things, such as reading the newspaper or watching television (PHQ Adolescent also includes...like school work): 0 Moving or speaking so slowly that other people could have noticed. Or the opposite - being so fidgety or restless that you have been moving around a lot more than usual:  0 Thoughts that you would be better off dead, or of hurting yourself in some way: 0 PHQ-9 Total Score: 4 If you checked off any problems, how difficult have these problems made it for you to do your work, take care of things at home, or get along with other people?: Somewhat difficult  Depression Treatment Depression Interventions/Treatment : [id number] Score <4 Follow-up Not Indicated     Goals Addressed               This  Visit's Progress     lose 30lbs (pt-stated)               Objective:    Today's Vitals   01/10/24 1049  BP: 126/74  Weight: 212 lb 3.2 oz (96.3 kg)  Height: 5' 7 (1.702 m)   Body mass index is 33.24 kg/m.  Hearing/Vision screen Hearing Screening - Comments:: Wears hearing aids Vision Screening - Comments:: UTD @ w/Dr. Josh Morita Dixon Immunizations and Health Maintenance Health Maintenance  Topic Date Due   Zoster Vaccines- Shingrix (1 of 2) 04/24/1998   COVID-19 Vaccine (7 - 2025-26 season) 05/03/2024   Medicare Annual Wellness (AWV)  01/09/2025   Colonoscopy  07/06/2025   DTaP/Tdap/Td (3 - Tdap) 02/21/2028   Pneumococcal Vaccine: 50+ Years  Completed   Influenza Vaccine  Completed   Hepatitis C Screening  Completed   Meningococcal B Vaccine  Aged Out        Assessment/Plan:  This is a routine wellness examination for Joshua [name].  Patient Care Team: Melvin Pao, NP as PCP - General (Nurse Practitioner) Fernand Elfreda LABOR, MD as Consulting Physician (Pulmonary Disease) Josh Server, MD as Referring Physician (Ophthalmology) Borg, Asberry SQUIBB, PA-C (Dermatology) Effie Evalene PARAS, DC as Referring Physician (Chiropractic Medicine) Twylla Glendia BROCKS, MD (Urology)  I have personally reviewed and noted the following in the patient's chart:   Medical and social history Use of alcohol, tobacco or illicit drugs  Current medications and supplements including opioid prescriptions. Functional ability and status Nutritional status Physical activity Advanced directives List of other physicians Hospitalizations, surgeries, and ER visits in previous 12 months Vitals Screenings to include cognitive, depression, and falls Referrals and appointments  No orders of the defined types were placed in this encounter.  In addition, I have reviewed and discussed with patient certain preventive protocols, quality metrics, and best practice recommendations. A written  personalized care plan for preventive services as well as general preventive health recommendations were provided to patient.   Vina Ned, CMA   01/10/2024   Return in 1 year (on 01/10/2025) for Medicare Annual Wellness Visit.  After Visit Summary: (In Person-Printed) AVS printed and given to the patient  Nurse Notes:  Declined Shingrix vaccine

## 2024-04-19 ENCOUNTER — Ambulatory Visit: Admitting: Nurse Practitioner

## 2024-08-16 ENCOUNTER — Ambulatory Visit: Admitting: Urology

## 2025-01-10 ENCOUNTER — Ambulatory Visit
